# Patient Record
Sex: Male | Born: 1955
Health system: Southern US, Community
[De-identification: ages and names within clinical notes are randomized; demographics above are authoritative.]

## PROBLEM LIST (undated history)

## (undated) DIAGNOSIS — R739 Hyperglycemia, unspecified: Secondary | ICD-10-CM

## (undated) DIAGNOSIS — I25810 Atherosclerosis of coronary artery bypass graft(s) without angina pectoris: Secondary | ICD-10-CM

## (undated) DIAGNOSIS — M109 Gout, unspecified: Secondary | ICD-10-CM

## (undated) DIAGNOSIS — I2510 Atherosclerotic heart disease of native coronary artery without angina pectoris: Secondary | ICD-10-CM

## (undated) DIAGNOSIS — M542 Cervicalgia: Secondary | ICD-10-CM

## (undated) DIAGNOSIS — M549 Dorsalgia, unspecified: Secondary | ICD-10-CM

## (undated) DIAGNOSIS — I10 Essential (primary) hypertension: Secondary | ICD-10-CM

## (undated) DIAGNOSIS — T7840XA Allergy, unspecified, initial encounter: Secondary | ICD-10-CM

## (undated) DIAGNOSIS — T883XXA Malignant hyperthermia due to anesthesia, initial encounter: Secondary | ICD-10-CM

## (undated) DIAGNOSIS — R3915 Urgency of urination: Secondary | ICD-10-CM

## (undated) DIAGNOSIS — E785 Hyperlipidemia, unspecified: Secondary | ICD-10-CM

## (undated) DIAGNOSIS — I779 Disorder of arteries and arterioles, unspecified: Secondary | ICD-10-CM

## (undated) DIAGNOSIS — I209 Angina pectoris, unspecified: Secondary | ICD-10-CM

## (undated) DIAGNOSIS — R233 Spontaneous ecchymoses: Secondary | ICD-10-CM

## (undated) DIAGNOSIS — R238 Other skin changes: Secondary | ICD-10-CM

## (undated) DIAGNOSIS — I739 Peripheral vascular disease, unspecified: Secondary | ICD-10-CM

## (undated) DIAGNOSIS — K219 Gastro-esophageal reflux disease without esophagitis: Secondary | ICD-10-CM

## (undated) HISTORY — DX: Allergy, unspecified, initial encounter: T78.40XA

## (undated) HISTORY — DX: Disorder of arteries and arterioles, unspecified: I77.9

## (undated) HISTORY — DX: Peripheral vascular disease, unspecified: I73.9

## (undated) HISTORY — DX: Atherosclerotic heart disease of native coronary artery without angina pectoris: I25.10

## (undated) HISTORY — DX: Atherosclerosis of coronary artery bypass graft(s) without angina pectoris: I25.810

## (undated) HISTORY — PX: ESOPHAGOGASTRODUODENOSCOPY: SHX1529

## (undated) HISTORY — DX: Essential (primary) hypertension: I10

## (undated) HISTORY — DX: Gout, unspecified: M10.9

## (undated) HISTORY — DX: Hyperglycemia, unspecified: R73.9

## (undated) HISTORY — PX: COLONOSCOPY: SHX174

## (undated) HISTORY — PX: CYSTOSCOPY: SUR368

## (undated) HISTORY — DX: Hyperlipidemia, unspecified: E78.5

---

## 1978-01-02 HISTORY — PX: FRACTURE SURGERY: SHX138

## 1979-01-03 HISTORY — PX: ANKLE FRACTURE SURGERY: SHX122

## 1997-01-02 HISTORY — PX: CORONARY ARTERY BYPASS GRAFT: SHX141

## 1997-11-12 ENCOUNTER — Inpatient Hospital Stay (HOSPITAL_COMMUNITY): Admission: AD | Admit: 1997-11-12 | Discharge: 1997-11-16 | Payer: Self-pay | Admitting: Cardiology

## 1997-11-12 ENCOUNTER — Encounter: Payer: Self-pay | Admitting: Cardiology

## 1997-11-13 ENCOUNTER — Encounter: Payer: Self-pay | Admitting: Thoracic Surgery (Cardiothoracic Vascular Surgery)

## 1997-11-14 ENCOUNTER — Encounter: Payer: Self-pay | Admitting: Thoracic Surgery (Cardiothoracic Vascular Surgery)

## 1997-11-15 ENCOUNTER — Encounter: Payer: Self-pay | Admitting: Thoracic Surgery (Cardiothoracic Vascular Surgery)

## 1999-11-18 ENCOUNTER — Ambulatory Visit (HOSPITAL_COMMUNITY): Admission: RE | Admit: 1999-11-18 | Discharge: 1999-11-18 | Payer: Self-pay | Admitting: Gastroenterology

## 2001-06-28 ENCOUNTER — Ambulatory Visit (HOSPITAL_COMMUNITY): Admission: RE | Admit: 2001-06-28 | Discharge: 2001-06-28 | Payer: Self-pay | Admitting: Cardiology

## 2001-08-02 ENCOUNTER — Ambulatory Visit: Admission: RE | Admit: 2001-08-02 | Discharge: 2001-08-02 | Payer: Self-pay | Admitting: General Surgery

## 2001-08-02 ENCOUNTER — Encounter: Payer: Self-pay | Admitting: General Surgery

## 2001-08-08 ENCOUNTER — Ambulatory Visit (HOSPITAL_COMMUNITY): Admission: RE | Admit: 2001-08-08 | Discharge: 2001-08-08 | Payer: Self-pay | Admitting: General Surgery

## 2003-04-28 ENCOUNTER — Ambulatory Visit (HOSPITAL_COMMUNITY): Admission: RE | Admit: 2003-04-28 | Discharge: 2003-04-28 | Payer: Self-pay | Admitting: Family Medicine

## 2003-05-07 ENCOUNTER — Ambulatory Visit (HOSPITAL_COMMUNITY): Admission: RE | Admit: 2003-05-07 | Discharge: 2003-05-07 | Payer: Self-pay | Admitting: Gastroenterology

## 2003-08-03 HISTORY — PX: INGUINAL HERNIA REPAIR: SUR1180

## 2003-12-25 ENCOUNTER — Ambulatory Visit: Payer: Self-pay | Admitting: Cardiology

## 2004-02-26 ENCOUNTER — Ambulatory Visit: Payer: Self-pay | Admitting: Cardiology

## 2004-02-26 ENCOUNTER — Ambulatory Visit: Payer: Self-pay

## 2004-12-02 ENCOUNTER — Ambulatory Visit: Payer: Self-pay | Admitting: Cardiology

## 2005-05-05 ENCOUNTER — Ambulatory Visit (HOSPITAL_COMMUNITY): Admission: RE | Admit: 2005-05-05 | Discharge: 2005-05-05 | Payer: Self-pay | Admitting: Family Medicine

## 2005-12-01 ENCOUNTER — Ambulatory Visit: Payer: Self-pay | Admitting: Cardiology

## 2005-12-29 ENCOUNTER — Ambulatory Visit: Payer: Self-pay | Admitting: Cardiology

## 2006-01-12 ENCOUNTER — Ambulatory Visit: Payer: Self-pay

## 2006-01-12 ENCOUNTER — Encounter: Payer: Self-pay | Admitting: Internal Medicine

## 2006-12-07 ENCOUNTER — Ambulatory Visit: Payer: Self-pay | Admitting: Cardiology

## 2006-12-07 LAB — CONVERTED CEMR LAB
ALT: 37 units/L (ref 0–53)
Albumin: 4 g/dL (ref 3.5–5.2)
Alkaline Phosphatase: 55 units/L (ref 39–117)
BUN: 14 mg/dL (ref 6–23)
Bilirubin, Direct: 0.2 mg/dL (ref 0.0–0.3)
CO2: 29 meq/L (ref 19–32)
Chloride: 101 meq/L (ref 96–112)
Cholesterol: 130 mg/dL (ref 0–200)
Creatinine, Ser: 1.2 mg/dL (ref 0.4–1.5)
GFR calc Af Amer: 82 mL/min
Glucose, Bld: 108 mg/dL — ABNORMAL HIGH (ref 70–99)
HDL: 42 mg/dL (ref >39.0)
Potassium: 4.3 meq/L (ref 3.5–5.1)
Total Bilirubin: 0.7 mg/dL (ref 0.3–1.2)
Total Protein: 7 g/dL (ref 6.0–8.3)
VLDL: 13 mg/dL (ref 0–40)

## 2006-12-14 ENCOUNTER — Ambulatory Visit: Payer: Self-pay | Admitting: Cardiology

## 2007-07-18 ENCOUNTER — Ambulatory Visit (HOSPITAL_COMMUNITY): Admission: RE | Admit: 2007-07-18 | Discharge: 2007-07-18 | Payer: Self-pay | Admitting: Internal Medicine

## 2007-12-11 ENCOUNTER — Ambulatory Visit: Payer: Self-pay | Admitting: Cardiology

## 2007-12-20 ENCOUNTER — Ambulatory Visit: Payer: Self-pay

## 2007-12-20 ENCOUNTER — Ambulatory Visit: Payer: Self-pay | Admitting: Cardiology

## 2007-12-20 LAB — CONVERTED CEMR LAB
AST: 33 units/L (ref 0–37)
Alkaline Phosphatase: 57 units/L (ref 39–117)
Bilirubin, Direct: 0.1 mg/dL (ref 0.0–0.3)
Cholesterol: 126 mg/dL (ref 0–200)
GFR calc non Af Amer: 83 mL/min
Sodium: 137 meq/L (ref 135–145)
VLDL: 17 mg/dL (ref 0–40)

## 2008-09-23 ENCOUNTER — Encounter (INDEPENDENT_AMBULATORY_CARE_PROVIDER_SITE_OTHER): Payer: Self-pay | Admitting: *Deleted

## 2008-12-15 DIAGNOSIS — I10 Essential (primary) hypertension: Secondary | ICD-10-CM | POA: Insufficient documentation

## 2008-12-15 DIAGNOSIS — E785 Hyperlipidemia, unspecified: Secondary | ICD-10-CM | POA: Insufficient documentation

## 2008-12-18 ENCOUNTER — Telehealth: Payer: Self-pay | Admitting: Cardiology

## 2008-12-29 ENCOUNTER — Telehealth: Payer: Self-pay | Admitting: Cardiology

## 2009-01-22 ENCOUNTER — Ambulatory Visit: Payer: Self-pay | Admitting: Cardiology

## 2009-01-27 ENCOUNTER — Telehealth: Payer: Self-pay | Admitting: Cardiology

## 2009-09-30 ENCOUNTER — Ambulatory Visit (HOSPITAL_COMMUNITY): Admission: RE | Admit: 2009-09-30 | Discharge: 2009-09-30 | Payer: Self-pay | Admitting: Family Medicine

## 2009-12-22 ENCOUNTER — Telehealth (INDEPENDENT_AMBULATORY_CARE_PROVIDER_SITE_OTHER): Payer: Self-pay | Admitting: Radiology

## 2009-12-23 ENCOUNTER — Encounter: Payer: Self-pay | Admitting: Cardiology

## 2009-12-23 ENCOUNTER — Ambulatory Visit: Payer: Self-pay | Admitting: Cardiology

## 2009-12-23 ENCOUNTER — Ambulatory Visit: Payer: Self-pay

## 2009-12-23 ENCOUNTER — Encounter (HOSPITAL_COMMUNITY)
Admission: RE | Admit: 2009-12-23 | Discharge: 2010-02-01 | Payer: Self-pay | Source: Home / Self Care | Attending: Cardiology | Admitting: Cardiology

## 2009-12-28 ENCOUNTER — Encounter: Payer: Self-pay | Admitting: Cardiology

## 2009-12-30 LAB — CONVERTED CEMR LAB
AST: 35 units/L (ref 0–37)
Albumin: 4.1 g/dL (ref 3.5–5.2)
Alkaline Phosphatase: 59 units/L (ref 39–117)
Basophils Absolute: 0 10*3/uL (ref 0.0–0.1)
CO2: 28 meq/L (ref 19–32)
Cholesterol: 130 mg/dL (ref 0–200)
Creatinine, Ser: 1 mg/dL (ref 0.4–1.5)
Eosinophils Absolute: 0.2 10*3/uL (ref 0.0–0.7)
HCT: 42.7 % (ref 39.0–52.0)
HDL: 43.1 mg/dL (ref >39.00)
LDL Cholesterol: 60 mg/dL (ref 0–99)
MCV: 93 fL (ref 78.0–100.0)
Monocytes Absolute: 0.9 10*3/uL (ref 0.1–1.0)
Monocytes Relative: 11.4 % (ref 3.0–12.0)
Neutrophils Relative %: 64.3 % (ref 43.0–77.0)
RBC: 4.6 M/uL (ref 4.22–5.81)
RDW: 12.9 % (ref 11.5–14.6)
Sodium: 138 meq/L (ref 135–145)
VLDL: 27 mg/dL (ref 0.0–40.0)

## 2010-01-24 ENCOUNTER — Telehealth: Payer: Self-pay | Admitting: Cardiology

## 2010-01-27 ENCOUNTER — Telehealth: Payer: Self-pay | Admitting: Cardiology

## 2010-01-30 LAB — CONVERTED CEMR LAB
Alkaline Phosphatase: 56 units/L (ref 39–117)
BUN: 12 mg/dL (ref 6–23)
Basophils Absolute: 0 10*3/uL (ref 0.0–0.1)
Basophils Relative: 0.4 % (ref 0.0–3.0)
Bilirubin, Direct: 0 mg/dL (ref 0.0–0.3)
Calcium: 9.5 mg/dL (ref 8.4–10.5)
Eosinophils Absolute: 0.3 10*3/uL (ref 0.0–0.7)
Eosinophils Relative: 5.6 % — ABNORMAL HIGH (ref 0.0–5.0)
Glucose, Bld: 89 mg/dL (ref 70–99)
HCT: 45.2 % (ref 39.0–52.0)
HDL: 48.2 mg/dL (ref >39.00)
Hemoglobin: 14.6 g/dL (ref 13.0–17.0)
LDL Cholesterol: 52 mg/dL (ref 0–99)
MCHC: 32.4 g/dL (ref 30.0–36.0)
MCV: 95.9 fL (ref 78.0–100.0)
Monocytes Absolute: 0.7 10*3/uL (ref 0.1–1.0)
Monocytes Relative: 11.6 % (ref 3.0–12.0)
RBC: 4.72 M/uL (ref 4.22–5.81)
RDW: 12.7 % (ref 11.5–14.6)
Triglycerides: 106 mg/dL (ref 0.0–149.0)
VLDL: 21.2 mg/dL (ref 0.0–40.0)
WBC: 5.8 10*3/uL (ref 4.5–10.5)

## 2010-02-01 ENCOUNTER — Telehealth: Payer: Self-pay | Admitting: Cardiology

## 2010-02-01 NOTE — Assessment & Plan Note (Signed)
Summary: yearly/sl      Allergies Added:   Visit Type:  Follow-up Primary Provider:  Dr Lilia Pro  CC:  no complaints.  History of Present Illness: Michael [name] returns today for evaluation and management of his coronary disease, hypertension, and hyperlipidemia.  He is limited by his left heel he did not have surgically repaired. He said it is not much they can do for it.  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 He denies any chest discomfort or angina. He denies orthopnea, PND or peripheral edema. He struggled to get about 10 pounds the weight off.  Current Medications (verified): 1)  Aspirin Ec 325 Mg Tbec (Aspirin) .... Take One Tablet By Mouth Daily 2)  Diovan Hct 160-12.5 Mg Tabs (Valsartan-Hydrochlorothiazide) .Marland Kitchen.. 1 Tab Once Daily 3)  Vytorin 10-40 Mg Tabs (Ezetimibe-Simvastatin) .Marland Kitchen.. 1 Tab Once Daily 4)  Allopurinol 300 Mg Tabs (Allopurinol) .Marland Kitchen.. 1 Tab Once Daily  Allergies (verified): 1)  ! 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 ..quit 1998 Regular Exercise - no Alcohol Use - yes  Risk Factors: Exercise: no (12/15/2008)  Risk Factors: Smoking Status: quit (12/15/2008)  Review of Systems       negative other than history of present illness  Vital Signs:  Patient profile:   55 year old male Height:      70 inches Weight:      215 pounds BMI:     30.96 Pulse rate:   94 / minute BP  sitting:   148 / 84  (left arm) Cuff size:   large  Vitals Entered By: Burnett Kanaris, CNA (January 22, 2009 9:42 AM)  Physical Exam  General:  muscular, overweight Head:  normocephalic and atraumatic Eyes:  PERRLA/EOM intact; conjunctiva and lids normal. Mouth:  Teeth, gums and palate normal. Oral mucosa normal. Neck:  Neck supple, no JVD. No masses, thyromegaly or abnormal cervical nodes. Chest [name]:  no deformities or breast masses noted Lungs:  Clear bilaterally to auscultation and percussion. Heart:  Non-displaced PMI, chest non-tender; regular rate and rhythm, S1, S2 without murmurs, rubs or gallops. Carotid upstroke normal, no bruit. Normal abdominal aortic size, no bruits. Femorals normal pulses, no bruits. Pedals normal pulses. No edema, no varicosities. Abdomen:  Bowel sounds positive; abdomen soft and non-tender without masses, organomegaly, or hernias noted. No hepatosplenomegaly. Msk:  decreased ROM.   Pulses:  pulses normal in all 4 extremities Extremities:  No clubbing or cyanosis. Neurologic:  Alert and oriented x 3. Skin:  Intact without lesions or rashes. Psych:  Normal affect.   Impression & Recommendations:  Problem # 1:  CAD, ARTERY BYPASS GRAFT/ 1999 (ICD-414.04) Assessment Unchanged  I do not feel we need to repeat an objective assessment of his coronary disease this year. He is asymptomatic and his stress Myoview last year was unremarkable. We will repeat one next year. His updated medication list for this problem includes:    Aspirin Ec 325 Mg Tbec (Aspirin) .Marland Kitchen... Take one tablet by mouth daily  Orders: EKG w/ Interpretation (  93000) TLB-TSH (Thyroid Stimulating Hormone) (84443-TSH)  Problem # 2:  HYPERTENSION, CONTROLLED (ICD-401.1) His blood pressure is suboptimal today. He says it usually runs better than this at home. Have asked him to keep a blood pressure log and if his pressure is not less than 140/80 we could increase his Diovan to 320/25 of HCTZ.  He promises made let us know if is not controlled. His updated medication list for this problem includes:    Aspirin Ec 325 Mg Tbec (Aspirin) .Marland Kitchen... Take one tablet by mouth daily    Diovan Hct 160-12.5 Mg Tabs (Valsartan-hydrochlorothiazide) .Marland Kitchen... 1 tab once daily  His updated medication list for this problem includes:    Aspirin Ec 325 Mg Tbec (Aspirin) .Marland Kitchen... Take one tablet by mouth daily    Diovan Hct 160-12.5 Mg Tabs (Valsartan-hydrochlorothiazide) .Marland Kitchen... 1 tab once daily  Orders: TLB-BMP (Basic Metabolic Panel-BMET) (80048-METABOL)  Problem # 3:  HYPERLIPIDEMIA (ICD-272.4) Assessment: Unchanged  I will check a conference and metabolic panel today, TSH, as well as fasting lipids. I have encouraged him to lose weight. His updated medication list for this problem includes:    Vytorin 10-40 Mg Tabs (Ezetimibe-simvastatin) .Marland Kitchen... 1 tab once daily  His updated medication list for this problem includes:    Vytorin 10-40 Mg Tabs (Ezetimibe-simvastatin) .Marland Kitchen... 1 tab once daily  Orders: TLB-Lipid Panel (80061-LIPID)  Other Orders: TLB-CBC Platelet - w/Differential (85025-CBCD) TLB-Hepatic/Liver Function Pnl (80076-HEPATIC)  Patient Instructions: 1)  Your physician recommends that you schedule a follow-up appointment in: YEAR 2)  Your physician recommends that you return for lab work ZO:XWRUE BMET CBC LIPID LIVER TSH  3)  Your physician recommends that you continue on your current medications as directed. Please refer to the Current Medication list given to you today.

## 2010-02-01 NOTE — Progress Notes (Signed)
Summary: lab results   Phone Note Call from Patient Call back at [phone]   Caller: Patient Reason for Call: Lab or Test Results Initial call taken by: Judie Grieve,  January 27, 2009 1:00 PM  Follow-up for Phone Call        PT AWARE OF LAB RESULTS./CY Follow-up by: Scherrie Bateman, LPN,  January 27, 2009 1:02 PM

## 2010-02-02 ENCOUNTER — Telehealth: Payer: Self-pay | Admitting: Cardiology

## 2010-02-03 NOTE — Assessment & Plan Note (Signed)
Summary: F1Y/DFG   Visit Type:  1 yr f/u Primary Provider:  [facility]  CC:  pt had myoview today and denies any cardiac complaints today... and pt would like to talk to Dr. Daleen Squibb today about changing some of his meds to generic versions.  History of Present Illness: Mr. Ridener returns today for evaluation and management his coronary artery disease, history of current bypass grafting in 1999, hyperlipidemia, hypertension.  He's having no symptoms of angina or ischemia. He remains very active working. He is very concerned about the cost of his meds like to switch to generic statin and generic hypertensive therapy.  His cholesterol today is 1:30, triglycerides 135, HDL 43, total cholesterol ratio ratio 3 LDL 60. LFTs are normal electrolytes are normal and creatinine 1.0. Hemoglobin 14.9 platelets 229,000. Reviewed with patient and copy of the reports given to him.  A stress nuclear today shows an ejection fraction of 68% with no ischemia. Final report pending  Current Medications (verified): 1)  Aspirin Ec 325 Mg Tbec (Aspirin) .... Take One Tablet By Mouth Daily 2)  Diovan Hct 160-12.5 Mg Tabs (Valsartan-Hydrochlorothiazide) .Marland Kitchen.. 1 Tab Once Daily 3)  Vytorin 10-40 Mg Tabs (Ezetimibe-Simvastatin) .Marland Kitchen.. 1 Tab Once Daily 4)  Allopurinol 300 Mg Tabs (Allopurinol) .Marland Kitchen.. 1 Tab Once Daily  Allergies: 1)  ! 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 ..quit 1998 Regular Exercise - no Alcohol Use - yes  Risk Factors: Exercise: no (12/15/2008)  Risk Factors: Smoking Status: quit (12/15/2008)  Vital Signs:  Patient profile:   55 year old male Height:      70 inches Weight:      222.50 pounds BMI:     32.04 Pulse rate:   102 / minute Pulse rhythm:   irregular BP sitting:   122 / 70  (left arm) Cuff size:   large  Vitals Entered By: Danielle Rankin, CMA (December 23, 2009 2:01 PM)   Impression & Recommendations:  Problem # 1:  CAD, ARTERY BYPASS GRAFT/ 1999 (ICD-414.04) Assessment Unchanged  His updated medication list for this problem includes:    Aspirin Ec 325 Mg Tbec (Aspirin) .Marland Kitchen... Take one tablet by mouth daily  Problem # 2:  HYPERLIPIDEMIA (ICD-272.4) Assessment: Improved Will change to atorvastatin 80 mg of all blood work in 6-8 weeks. His updated medication list for this problem includes:    Vytorin 10-40 Mg Tabs (Ezetimibe-simvastatin) .Marland Kitchen... 1 tab once daily    Lipitor 80 Mg Tabs (Atorvastatin calcium) .Marland Kitchen... Take one tablet by mouth daily.  Problem # 3:  HYPERTENSION, CONTROLLED (ICD-401.1) Assessment: Improved we'll change to losartan HCTZ. Patient advised to check his blood pressure carefully once the changes made. His updated medication list for this problem includes:    Aspirin Ec 325 Mg Tbec (Aspirin) .Marland Kitchen... Take one tablet by mouth daily    Losartan Potassium-hctz 50-12.5 Mg Tabs (Losartan potassium-hctz) .Marland Kitchen... Take 1 tablet daily  Patient Instructions: 1)  Your physician recommends that you schedule a follow-up appointment in: 1 year with Dr.  Khaliel Morey 2)  Your physician has recommended you make the following change in your medication:  3)  Your physician recommends that you return for a FASTING lipid profile and liver in 6 weeks Prescriptions: LIPITOR 80 MG TABS (ATORVASTATIN CALCIUM) Take one tablet by mouth daily.  #30 x 3   Entered by:   Lisabeth Devoid RN   Authorized  by:   Gaylord Shih, MD, [facility]   Signed by:   Lisabeth Devoid RN on 12/23/2009   Method used:   Electronically to        [facility] (retail)       [address]       [place], [place]  [address]       Ph: [phone]       Fax: [phone]   RxID:   [id number] LOSARTAN POTASSIUM-HCTZ 50-12.5 MG TABS (LOSARTAN POTASSIUM-HCTZ) Take 1 tablet daily  #30 x 3   Entered by:   Lisabeth Devoid RN   Authorized by:   Gaylord Shih, MD, [facility]   Signed by:   Lisabeth Devoid RN on 12/23/2009   Method used:   Electronically to        [facility] (retail)       [address]       [place], [place]  [address]       Ph: [phone]       Fax: [phone]   RxID:   [id number] LIPITOR 80 MG TABS (ATORVASTATIN CALCIUM) Take one tablet by mouth daily.  #90 x 3   Entered by:   Lisabeth Devoid RN   Authorized by:   Gaylord Shih, MD, [facility]   Signed by:   Lisabeth Devoid RN on 12/23/2009   Method used:   Electronically to        [facility] (retail)             ,          Ph: [phone]       Fax: [phone]   RxID:   [id number] LOSARTAN POTASSIUM-HCTZ 50-12.5 MG TABS (LOSARTAN POTASSIUM-HCTZ) Take 1 tablet daily  #90 x 3   Entered by:   Lisabeth Devoid RN   Authorized by:   Gaylord Shih, MD, [facility]   Signed by:   Lisabeth Devoid RN on 12/23/2009   Method used:   Electronically to        [facility] (retail)             ,          Ph: [phone]       Fax: [phone]   RxID:   [id number]   Appended Document: F1Y/DFG    Clinical Lists Changes  Medications: Removed medication of VYTORIN 10-40 MG TABS (EZETIMIBE-SIMVASTATIN) 1 tab once daily Observations: Added new observation of PI CARDIO: Your physician recommends that you schedule a follow-up appointment in: 1 year with Dr. Daleen Squibb Your physician recommends that you return for a FASTING lipid profile:  02-18-10 at 8:45 am Your physician has recommended you make the  following change in your medication:  (12/23/2009 14:45)       Patient Instructions: 1)  Your physician recommends that you schedule a follow-up appointment in: 1 year with Dr. Daleen Squibb 2)  Your physician recommends that you return for a FASTING lipid profile:  02-18-10 at 8:45 am 3)  Your physician has recommended  you make the following change in your medication:

## 2010-02-03 NOTE — Progress Notes (Signed)
Summary: nuc pre-procedure  Phone Note Outgoing Call   Call placed by: Harlow Asa CNMT Call placed to: Patient Reason for Call: Confirm/change Appt Summary of Call: Left message with information on Myoview Information Sheet (see scanned document for details).      Nuclear Med Background Indications for Stress Test: Evaluation for Ischemia, Graft Patency   History: CABG, Echo, Myocardial Perfusion Study      Nuclear Pre-Procedure Cardiac Risk Factors: History of Smoking, Hypertension, Lipids Height (in): 70

## 2010-02-03 NOTE — Progress Notes (Addendum)
Summary: change of meds   Phone Note Call from Patient Call back at Work Phone [phone]   Caller: Patient Reason for Call: Talk to Nurse Summary of Call: pt calling back re meds. pt states debie called yesterday. Initial call taken by: Roe Coombs,  January 27, 2010 8:20 AM  Follow-up for Phone Call        01/27/10--1000am--pt calling back wanting to speak to dr Ashden Sonnenberg or debbie about changing his meds to some that are more cost effective--pt states he would like to go on simvastatin in place of generic lipitor --we will need a doseage, and would like to try lisinopril in place of losartin and we'll need dosage on this med too--once we get dr Vern Claude [place] please order through [facility]--on the lisinopril pt would like 30 day supply to see if he has any problems with drug Follow-up by: Ledon Snare, RN,  January 27, 2010 9:58 AM     Appended Document: change of meds didnt we address this already?

## 2010-02-03 NOTE — Assessment & Plan Note (Signed)
Summary: Cardiology Nuclear Testing  Nuclear Med Background Indications for Stress Test: Evaluation for Ischemia, Graft Patency   History: CABG, Echo, Myocardial Perfusion Study  History Comments: '99 CABG;'09 BJS:EGBTDV, EF=71%   Symptoms Comments: No cardiac complaints.   Nuclear Pre-Procedure Cardiac Risk Factors: History of Smoking, Hypertension, Lipids, Obesity Caffeine/Decaff Intake: None NPO After: 7:30 PM Lungs: Valera Castle, MD IV 0.9% NS with Angio Cath: 22g     IV Site: R Hand IV Started by: Bonnita Levan, RN Chest Size (in) 48     Height (in): 70 Weight (lb): 222 BMI: 31.97  Nuclear Med Study 1 or 2 day study:  1 day     Stress Test Type:  Stress Reading MD:  Olga Millers, MD     Referring MD:  Valera Castle, MD Resting Radionuclide:  Technetium 19m Tetrofosmin     Resting Radionuclide Dose:  11 mCi  Stress Radionuclide:  Technetium 68m Tetrofosmin     Stress Radionuclide Dose:  33 mCi   Stress Protocol Exercise Time (min):  6:00 min     Max HR:  141 bpm     Predicted Max HR:  165 bpm  Max Systolic BP: 219 mm Hg     Percent Max HR:  85.45 %     METS: 7.0 Rate Pressure Product:  76160    Stress Test Technologist:  Rea [employer], CMA-N     Nuclear Technologist:  Doyne Keel, CNMT  Rest Procedure  Myocardial perfusion imaging was performed at rest 45 minutes following the intravenous administration of Technetium 51m Tetrofosmin.  Stress Procedure  The patient exercised for six minutes.  The patient stopped due to fatigue and denied any chest pain.  There were nonspecific ST-T wave changes, occasional PVC's, rare PAC's and a brief episode of NSIVCD.  He had a hypertensive response to exercise, 219/83 to exercise.  Technetium 34m Tetrofosmin was injected at peak exercise and myocardial perfusion imaging was performed after a brief delay.  QPS Raw Data Images:  Acquisition technically good; normal left ventricular size. Stress Images:  There is decreased uptake in  the inferior wall Rest Images:  There is decreased uptake in the inferior wall. Subtraction (SDS):  No evidence of ischemia. Transient Ischemic Dilatation:  1.11  (Normal <1.22)  Lung/Heart Ratio:  .27  (Normal <0.45)  Quantitative Gated Spect Images QGS EDV:  87 ml QGS ESV:  27 ml QGS EF:  69 % QGS cine images:  Normal wall motion.   Overall Impression  Exercise Capacity: Fair exercise capacity. BP Response: Hypertensive blood pressure response. Clinical Symptoms: No chest pain ECG Impression: No significant ST segment change suggestive of ischemia. Overall Impression: Probably normal stress nuclear study with inferior thinning but no ischemia.  Appended Document: Cardiology Nuclear Testing stable, no change in treatment.  Appended Document: Cardiology Nuclear Testing Line busy x3. Mylo Red RN  Appended Document: Cardiology Nuclear Testing Left message on personal voicemail. Mylo Red RN

## 2010-02-03 NOTE — Progress Notes (Addendum)
Summary: medication questions   Phone Note Call from Patient Call back at Work Phone [phone]   Caller: Patient Summary of Call: medication questions Initial call taken by: Judie Grieve,  January 24, 2010 2:31 PM  Follow-up for Phone Call        Patient's medications are too expensive. He did some research and found that the generic Lipitor cost more than Vytorin. He would like to switch to Simvastatin b/c this is cheaper with [facility]. Also,  his Losartan Potassium-HCTZ is $170 for a 90 day suuply. Some of the cheaper BP medications were the nonselective beta blockers. He does not tolerate the Ace-Inhibitors. The Calcium Channel blockers were not as expensive either. Advised him that I would need to forward this to Dr. Daleen Squibb for review. Whitney Maeola Sarah RN  January 24, 2010 3:27 PM  Follow-up by: Whitney Maeola Sarah RN,  January 24, 2010 3:27 PM  Additional Follow-up for Phone Call Additional follow up Details #1::        No sub for losarten hctz, can switch to simvastatin 40mg  but will be less effective than Vytorin. If switches needs bloodwork in 6 weeks. I do not want him on beta blocker or calcium blocker. Additional Follow-up by: Gaylord Shih, MD, [facility],  January 25, 2010 9:33 AM     Appended Document: medication questions LMOM to call back if he would like to switch to simvastatin. Mylo Red RN

## 2010-02-04 ENCOUNTER — Telehealth: Payer: Self-pay | Admitting: Cardiology

## 2010-02-08 ENCOUNTER — Telehealth: Payer: Self-pay | Admitting: Cardiology

## 2010-02-09 NOTE — Progress Notes (Signed)
Summary: Pt request call   Phone Note Call from Patient Call back at Work Phone [phone]   Caller: Patient Reason for Call: Talk to Nurse Initial call taken by: Judie Grieve,  February 01, 2010 8:59 AM  Follow-up for Phone Call        Please call him Deb. Follow-up by: Gaylord Shih, MD, [facility],  February 01, 2010 2:03 PM

## 2010-02-09 NOTE — Progress Notes (Signed)
Summary: Pt request call  Medications Added SIMVASTATIN 40 MG TABS (SIMVASTATIN) 1 tablet every evening.       Phone Note Call from Patient Call back at Work Phone [phone]   Caller: Patient Reason for Call: Talk to Nurse Initial call taken by: Judie Grieve,  February 02, 2010 8:12 AM  Follow-up for Phone Call        Pt calls today b/c he would like to try Lisinopril and stop the Losartan.  He is aware he had a cough "years ago" with the Altace.  He will start Simvastatin 40mg  as previously recommended and repeat lab work on 04/01/10.  I will forward to Dr. Daleen Squibb for review. Mylo Red RN  Additional Follow-up for Phone Call Additional follow up Details #1::        agree. Additional Follow-up by: Gaylord Shih, MD, [facility],  February 02, 2010 9:54 AM    New/Updated Medications: SIMVASTATIN 40 MG TABS (SIMVASTATIN) 1 tablet every evening. Prescriptions: SIMVASTATIN 40 MG TABS (SIMVASTATIN) 1 tablet every evening.  #90 x 3   Entered by:   Lisabeth Devoid RN   Authorized by:   Gaylord Shih, MD, [facility]   Signed by:   Lisabeth Devoid RN on 02/02/2010   Method used:   Faxed to ...       [facility] (mail-order)             , [place]         Ph: [phone]       Fax: [phone]   RxID:   [id number]

## 2010-02-17 NOTE — Progress Notes (Signed)
Summary:  Lisinopril  Medications Added LISINOPRIL-HYDROCHLOROTHIAZIDE 20-12.5 MG TABS (LISINOPRIL-HYDROCHLOROTHIAZIDE) Take 1 tablet daily       Phone Note Call from Patient Call back at Home Phone [phone] Call back at Work Phone [phone]   Caller: Patient Reason for Call: Talk to Nurse Summary of Call: PT CALLING RE  Lisinopril. PT STATES DEBBIE WAS GOING TO CALL HIM BACK RE MEDS Initial call taken by: Roe Coombs,  February 08, 2010 3:17 PM    New/Updated Medications: LISINOPRIL-HYDROCHLOROTHIAZIDE 20-12.5 MG TABS (LISINOPRIL-HYDROCHLOROTHIAZIDE) Take 1 tablet daily Prescriptions: LISINOPRIL-HYDROCHLOROTHIAZIDE 20-12.5 MG TABS (LISINOPRIL-HYDROCHLOROTHIAZIDE) Take 1 tablet daily  #30 x 1   Entered by:   Lisabeth Devoid RN   Authorized by:   Gaylord Shih, MD, [facility]   Signed by:   Lisabeth Devoid RN on 02/08/2010   Method used:   Electronically to        [facility] (retail)       [address]       [place], [place]  [address]       Ph: [phone]       Fax: [phone]   RxID:   [id number]

## 2010-02-17 NOTE — Progress Notes (Signed)
Summary: question on meds   Phone Note Call from Patient Call back at Home Phone [phone]   Caller: Mom Reason for Call: Talk to Nurse Summary of Call: pt has question re meds Initial call taken by: Roe Coombs,  February 04, 2010 12:56 PM  Follow-up for Phone Call        Pt calls to see if he can switch from Losartan to Lisinopril b/c of cost.  I will forward to Dr. Daleen Squibb Follow-up by: Lisabeth Devoid RN,  February 04, 2010 1:42 PM  Additional Follow-up for Phone Call Additional follow up Details #1::        Yes but he may have dry cough as before. Additional Follow-up by: Gaylord Shih, MD, [facility],  February 08, 2010 9:00 AM

## 2010-02-18 ENCOUNTER — Other Ambulatory Visit: Payer: Self-pay

## 2010-04-01 ENCOUNTER — Other Ambulatory Visit (INDEPENDENT_AMBULATORY_CARE_PROVIDER_SITE_OTHER): Payer: 59 | Admitting: *Deleted

## 2010-04-01 DIAGNOSIS — E7800 Pure hypercholesterolemia, unspecified: Secondary | ICD-10-CM

## 2010-04-01 LAB — BASIC METABOLIC PANEL
CO2: 30 mEq/L (ref 19–32)
Calcium: 9.4 mg/dL (ref 8.4–10.5)
Chloride: 100 mEq/L (ref 96–112)
GFR: 77.78 mL/min (ref >60.00)
Potassium: 4.4 mEq/L (ref 3.5–5.1)
Sodium: 138 mEq/L (ref 135–145)

## 2010-04-01 LAB — HEPATIC FUNCTION PANEL
Albumin: 4.3 g/dL (ref 3.5–5.2)
Alkaline Phosphatase: 56 U/L (ref 39–117)
Total Bilirubin: 0.5 mg/dL (ref 0.3–1.2)
Total Protein: 7.3 g/dL (ref 6.0–8.3)

## 2010-04-01 LAB — LIPID PANEL
Cholesterol: 153 mg/dL (ref 0–200)
LDL Cholesterol: 83 mg/dL (ref 0–99)
Triglycerides: 136 mg/dL (ref 0.0–149.0)

## 2010-04-04 ENCOUNTER — Telehealth: Payer: Self-pay | Admitting: Cardiology

## 2010-04-04 NOTE — Telephone Encounter (Signed)
Pt calling re results from lab work

## 2010-04-04 NOTE — Telephone Encounter (Addendum)
Pt. Given results of lipid.liver and bmp.  I told him we would call him back after Dr. Daleen Squibb reviews with recommendations  he may have.  Pt states he has been trying lisinopril and has one week left of this.  He states he is tolerating it well with occasional tickle but it is not bothersome.  OK to continue lisinopril, follow bp

## 2010-04-11 ENCOUNTER — Telehealth: Payer: Self-pay | Admitting: Cardiology

## 2010-04-11 NOTE — Telephone Encounter (Signed)
Pt returning your call

## 2010-04-11 NOTE — Telephone Encounter (Signed)
LMOM for call back. 

## 2010-04-11 NOTE — Telephone Encounter (Signed)
Pt calling to get lab results and to see if he needs to continue medication. He will be out of the lisinopril today.

## 2010-04-11 NOTE — Telephone Encounter (Signed)
I left message for pt. To call back.

## 2010-04-12 ENCOUNTER — Telehealth: Payer: Self-pay | Admitting: *Deleted

## 2010-04-12 DIAGNOSIS — I10 Essential (primary) hypertension: Secondary | ICD-10-CM

## 2010-04-12 MED ORDER — LISINOPRIL-HYDROCHLOROTHIAZIDE 20-12.5 MG PO TABS: 1.0000 | ORAL_TABLET | Freq: Every day | ORAL | Status: DC

## 2010-04-12 NOTE — Telephone Encounter (Signed)
lmovm Debbie Dariel Pellecchia RN  

## 2010-04-12 NOTE — Telephone Encounter (Signed)
Pt is able to tolerate Lisinopril-HCTZ 20-12.5mg  qd .  Prescription called in to [facility]. Mylo Red RN

## 2010-04-12 NOTE — Telephone Encounter (Signed)
Pt returned call. Lisinopril-hctz 20-12.5mg  ordered

## 2010-05-16 ENCOUNTER — Telehealth: Payer: Self-pay | Admitting: Cardiology

## 2010-05-16 NOTE — Telephone Encounter (Signed)
Pt needs to discuss changing his prescription for Lisinopril.

## 2010-05-16 NOTE — Telephone Encounter (Signed)
Pt calls today b/c he is unable to tolerate the Lisinopril. [facility] had suggested a beta blocker.  I told pt I would send this to Dr. Daleen Squibb for review. Mylo Red RN

## 2010-05-16 NOTE — Telephone Encounter (Signed)
lmovm will call back 05/17/10. Mylo Red RN

## 2010-05-17 MED ORDER — BENAZEPRIL-HYDROCHLOROTHIAZIDE 20-12.5 MG PO TABS: 1.0000 | ORAL_TABLET | Freq: Every day | ORAL | Status: DC

## 2010-05-17 NOTE — Telephone Encounter (Signed)
lmovm Debbie Kristyl Athens RN  

## 2010-05-17 NOTE — Assessment & Plan Note (Signed)
[facility]                            CARDIOLOGY OFFICE NOTE   NAME:Michael [name], Michael [name]                        MRN:          [id number]  DATE:12/11/2007                            DOB:          1955/06/05    Mr. Michael [name] comes in today for followup.  He is having no symptoms of  angina or ischemia.  He is having a major problem with his left heel.  He had some sort of stress fracture there.  He has seen a podiatrist but  saying he might go in and see Dr. Aldean Baker.  I have strongly  recommended he do so.  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   He has not had blood work in a year and is due.   He is currently taking:  1. Aspirin 325 mg a day.  2. Diovan/HCTZ 160/12.5 daily.  3. Vytorin 10/40 daily.  4. Allopurinol, unknown dose.   He has developed gout since I last saw him.   PHYSICAL EXAMINATION:  VITAL SIGNS:  His blood pressure is high today at  164/82, his pulse is 91 and regular, and his weight is 224.  HEENT:  Normal.  NECK:  Carotids upstrokes are equal bilaterally without bruits.  No JVD.  Thyroid is not enlarged.  Trachea is midline.  LUNGS:  Clear to auscultation and percussion.  HEART:  Poorly appreciated PMI.  Soft S1 and S2.  Sternotomy is stable.  ABDOMEN:  Soft.  Good bowel sounds.  No midline bruit.  No pulsatile  mass.  No hepatomegaly.  EXTREMITIES:  No cyanosis, clubbing, or edema.  Pulses are intact.  NEUROLOGIC:  Intact.  MUSCULOSKELETAL:  He has a knot that is not really that tender of his  left calcaneus, really in the area of the insertion of Achilles tendon.   I have had a long talk today about Michael [name] and his orthopedic issues.  I  have strongly recommended he see Dr. Aldean Baker, whom his mother is  seeing as well.  In addition, he is due a stress test, which we will do  an adenosine since he cannot really walk that well at present.  We will  also check  fasting lipids and a comprehensive metabolic panel the day he  comes.   I checked an EKG prior to his departure, and it shows normal sinus  rhythm with ST-segment changes in I, aVL, V, and VI, which are stable.   I will see him back in a year otherwise.     Thomas C. Daleen Squibb, MD, [facility]  Electronically Signed    TCW/MedQ  DD: 12/11/2007  DT: 12/12/2007  Job #: [id number]   cc:   Nadara Mustard, MD

## 2010-05-17 NOTE — Telephone Encounter (Signed)
Pt will start benazepril-hctz  20/12.5mg  per Dr. Daleen Squibb recommendation.   Mylo Red RN

## 2010-05-17 NOTE — Assessment & Plan Note (Signed)
[facility]                            CARDIOLOGY OFFICE NOTE   NAME:[name], Michael HICKAM                        MRN:          [id number]  DATE:12/14/2006                            DOB:          [birth year]    [name] returns for further management of the following issues.  1. Coronary artery disease status post bypass surgery in 1999.  Last      stress Myoview was January of 2008.  Good exercise tolerance.  EF      66% with no significant ischemia.  2. Hyperlipidemia.  Lipids drawn December 07, 2006.  Normal LFTs.      Lipids at goal except for a slight increase in his LDL at 75.  He      is on Vytorin 10/40.  3. Hypertension, under good control.   When I talk to him, he is having trouble with his weight.  He does not  do much physical activity.  In addition, he likes white starches.   Blood pressure 139/80, pulse 89 and regular, weight 223.  HEENT:  Normocephalic, atraumatic.  PERRLA.  Extraocular movements  intact.  Sclerae clear.  Facial symmetry normal.  Carotid upstrokes equal bilaterally.  No JVD.  Thyroid is not enlarged,  trachea is midline.  LUNGS:  Clear.  HEART:  Poorly appreciated PMI.  Normal S1, S2.  ABDOMEN:  Soft with good bowel sounds.  EXTREMITIES:  No cyanosis, clubbing, or edema.  Pulses are intact.   EKG is stable.   [name] is doing well.  I made no change in his program.  We will see  him back in a year.  At that time, get a stress Myoview and blood work.     Thomas C. Daleen Squibb, MD, [facility]  Electronically Signed    TCW/MedQ  DD: 12/14/2006  DT: 12/14/2006  Job #: [id number]

## 2010-05-17 NOTE — Telephone Encounter (Signed)
Pt rtn call-pls call [phone] or [phone]

## 2010-05-20 NOTE — Op Note (Signed)
Michael [name], Michael [name]                          ACCOUNT NO.:  [id number]   MEDICAL RECORD NO.:  [id number]                   PATIENT TYPE:  AMB   LOCATION:  DAY                                  FACILITY:  [facility]   PHYSICIAN:  Angelia Mould. Derrell Lolling, M.D.             DATE OF BIRTH:  [birth year]   DATE OF PROCEDURE:  DATE OF DISCHARGE:  08/08/2001                                 OPERATIVE REPORT   PREOPERATIVE DIAGNOSIS:  Bilateral inguinal hernia.   POSTOPERATIVE DIAGNOSIS:  Bilateral inguinal hernias.   OPERATION PERFORMED:  Laparoscopic preperitoneal repair of bilateral  inguinal hernias with mesh.   SURGEON:  Angelia Mould. Derrell Lolling, M.D.   OPERATIVE INDICATIONS:  This is a 55 year old white man who has had a bulge  in his right groin for at least 10 years.  This has been getting larger and  is now painful.  He also feels a small bulge in his left groin.  On  examination he has a large right inguinal hernia and a small left inguinal  hernia; both are reducible.  No scrotal mass.  He is brought to the  operating room electively for repair of his hernias.   OPERATIVE TECHNIQUE:  Following the induction of general endotracheal  anesthesia, a Foley catheter was inserted and the bladder was emptied of  urine.  Abdomen and groin were prepped and draped in a sterile fashion.  Then 0.5% Marcaine with epinephrine was used as a local infiltration  anesthetic.  A curved, transverse incision was made at the lower end of the  umbilicus.  The fascia was incised transversely, exposing the right rectus  muscle.  The right rectus muscle was retracted laterally, and we bluntly  dissected in the space behind the right rectus muscle.  Balloon dissector  was inserted into the right rectus sheath.  Video camera was inserted.  The  balloon dissector was inflated manually under direct vision, and the balloon  expanded fairly well on both sides.  We held the balloon in place for about  five minutes to reduce  bleeding.  The balloon was removed.  The insufflating  trocar was inserted into the right rectus sheath, and the balloon inflated  and secured; connected to the insufflator at 13 mmHg.  The video camera was  reinserted.  We had good visualization.  We could see both rectus muscles  anteriorly, inferior epigastric vessels anteriorly, symphysis pubis  inferiorly, and preperitoneal fat posteriorly.  We inserted 5 mm trocar in  the right lower quadrant and a 5 mm trocar in the left lower quadrant under  direct vision.  We dissected all of the peritoneum back laterally and away.   We found the bilateral direct inguinal hernias.  The one on the right side  was quite large, and the one on the left was smaller.  We dissected out the  cord structures, testicular vessels and vas deferens bilaterally.  The  edge  of the peritoneum was seen in both cases, and dissected all the way back  above the level of the anterosuperior iliac spine.   We dissected the direct inguinal hernias on both sides, dissecting the  pseudosac away from incarcerated fat, which was reduced back into the  preperitoneal space.  After we did this on both sides, we had good  anatomical definition of the symphysis pubis, Cooper's ligaments, iliac  vessels, internal ring, and the ileopubic tract both medial and lateral to  the internal ring.   The hernias were repaired bilaterally with the convex Bard mesh, large size.  On each side the mesh was inserted and positioned, and secured with 5 mm  tacking device.  The mesh was secured to the superior rim of the symphysis  pubis, and along the superior rim of Cooper's ligament with about four or  five of the tacks.  The mesh was secured up along the midline and across the  posterior belly of the rectus muscle; then laterally the 5 mm tacker was  fired above the ileopubic tract.  Everywhere, and especially laterally, we  were careful to be palpate the tacker through the skin so that we  could be  sure that we were high enough.  In the midline we overlapped the mesh about  1 cm.  This provided a very nice coverage both direct and indirect spaces.   Inspection was made.  We had pulled all the preperitoneal fat back and away  from the mesh.  There was no bleeding.  The mesh was held in place laterally  and pneumoperitoneum released.  The trocars were removed.  The fascia at the  umbilicus was closed with 0 Vicryl sutures and the skin closed with  subcuticular sutures of 4-0 Vicryl and Steri-Strips.  Clean bandages were  placed and the patient taken to the recovery room in stable condition.   ESTIMATED BLOOD LOSS:  Approximately 20 cc.   COMPLICATIONS:  None.   COUNTS:  Sponge, needle and instrument counts were correct.                                                Angelia Mould. Derrell Lolling, M.D.    HMI/MEDQ  D:  08/08/2001  T:  08/12/2001  Job:  [id number]   cc:   Michael [name], M.D.

## 2010-05-20 NOTE — Group Therapy Note (Signed)
NAME:  Michael [name], Michael [name] NO.:  [id number]   MEDICAL RECORD NO.:  [id number]          PATIENT TYPE:  OUT   LOCATION:  RDC                           FACILITY:  [facility]   PHYSICIAN:  Angus G. Renard Matter, MD   DATE OF BIRTH:  Jun 24, 1955   DATE OF PROCEDURE:  DATE OF DISCHARGE:  07/18/2007                                 PROGRESS NOTE   This patient does have a history of atrial fibrillation and  hypertension, was admitted with history of having become unresponsive.  He does have advanced Alzheimer disease and chronic atrial fibrillation  along with mitral regurgitation.   OBJECTIVE:  VITAL SIGNS:  Blood pressure 159/86, respiration 18, pulse  91, and temp 98.3.  GENERAL:  The patient's chemistries are within  normal limits.  The patient appears to be more alert.  LUNGS:  Clear to  P and A.  HEART:  Regular rhythm.  ABDOMEN:  No palpable organs or  masses.   ASSESSMENT:  The patient was admitted with altered mental status.  He  does have advanced dementia.  Plan to continue current regimen with help  of physical therapy that we have to get him ambulatory.  Plan is to get  him back home as soon as possible.  Incidentally, no patient has growth  on the right eye lid, which could be a malignancy.  The family response  have been aggressive with the treatment of this problem and does not  desire an additional evaluation at present.      Angus G. Renard Matter, MD  Electronically Signed     AGM/MEDQ  D:  07/20/2007  T:  07/20/2007  Job:  [id number]

## 2010-05-20 NOTE — Assessment & Plan Note (Signed)
[facility]                            CARDIOLOGY OFFICE NOTE   NAME:[name], [name]                        MRN:          [id number]  DATE:12/29/2005                            DOB:          October 17, 1955    Mr. Friedt returns today for management of the following issues:  1. Coronary artery disease status post coronary artery bypass grafting      in 1999.  Last stress Myoview was February 2006.  EF 72% with no      ischemia or infarction.  2. Mixed hyperlipidemia.  His lipids were checked in November 2007 and      were at goal.  3. Hypertension, under good control on Diovan HCT.   He has no cardiovascular complaints today.  He has gained 5 pounds.  He  has been having a lot of problems with his feet, particularly his right  arch.  He has been to a podiatrist but it actually made it worse.  He is  interested in an orthopedic physician.  I have conferred with Dr. Nicholes [name] and we both feel that Dr. Lestine [name] at [facility]  may be able to help him.   His current medications are:  1. Aspirin 325 mg a day.  2. Diovan HCT 160/12.5 daily.  3. Vytorin 10/40 daily.   EXAMINATION:  GENERAL:  He is in no acute distress.  VITAL SIGNS:  His blood pressure is 139/79, his pulse is 95 and regular.  His weight is 222.  HEENT:  He is normocephalic, atraumatic, PERRLA, extraocular movements  intact, sclerae injected.  Facial symmetry is normal.  Dentition is  normal.  Carotid upstrokes are equal bilaterally without bruits.  There  is no JVD.  Thyroid is not enlarged.  Trachea is midline.  LUNGS:  Clear to auscultation.  HEART:  Reveals a poorly appreciated PMI.  His sternotomy site is  stable.  Normal S1, S2.  He has got a systolic murmur that is louder at  the apex with inspiration, softer with expiration.  It disappears with  lying him down.  ABDOMEN:  Soft with good bowel sounds.  There is no midline bruit, there  is no hepatosplenomegaly.  Bowel  sounds are present.  EXTREMITIES:  Reveal no cyanosis, clubbing or edema.  He does have some  dependent rubor.  His toes are cool but he has bounding dorsalis pedis  and posterior tibial pulses.  There is no sign of DVT and no venous  stasis.  NEUROLOGIC:  Intact.  SKIN:  Intact and normal.   EKG shows sinus rhythm, left atrial enlargement, ST segment changes in  the lateral leads I and aVL, V5, and V6 which are old.   ASSESSMENT:  1. Coronary artery disease, currently stable.  He is due a rest/stress      Myoview.  2. Hypertension, under reasonable control.  3. Hyperlipidemia, at goal on Vytorin.  4. Systolic murmur that increases with inspiration, disappears with      lying down, rule out mitral valve pathology.  5. Orthopedic issues in his right foot.  RECOMMENDATIONS:  1. Rest/exercise stress Myoview February 2008.  2. A 2-D echocardiogram at the same time.  3. Refer to Dr. Lestine [name] at [facility].  4. See me back in a year.     Michael C. Daleen Squibb, MD, [facility]  Electronically Signed    TCW/MedQ  DD: 12/29/2005  DT: 12/29/2005  Job #: [id number]   cc:   Michael [name], M.D.

## 2010-05-20 NOTE — Op Note (Signed)
NAME:  Michael [name], Michael [name]                           ACCOUNT NO.:  [id number]   MEDICAL RECORD NO.:  [id number]                   PATIENT TYPE:  AMB   LOCATION:  ENDO                                 FACILITY:  [facility]   PHYSICIAN:  Danise Edge, M.D.                DATE OF BIRTH:  08-04-1955   DATE OF PROCEDURE:  05/07/2003  DATE OF DISCHARGE:                                 OPERATIVE REPORT   REFERRING PHYSICIAN:  Dr. Assunta Found, [facility].   PROCEDURAL INDICATIONS:  [name] is a 55 year old male born on  [birth year].  [name] is scheduled to undergo a screening  colonoscopy with polypectomy to prevent colon cancer.   ENDOSCOPIST:  Danise Edge, M.D.   PREMEDICATION:  Versed 10 mg, Demerol 70 mg.   PROCEDURE:  After obtaining informed consent, [name] was placed in the  left lateral decubitus position.  I administered intravenous Demerol and  intravenous Versed to achieve conscious sedation for the procedure.  Patient's blood pressure, oxygen saturation, and cardiac rhythm were  monitored throughout the procedure and documented in the medical records.   Anal inspection and digital rectal exam was normal.  The prostate was  nonnodular.  The Olympus adjustable pediatric colonoscope was introduced  into the rectum and advanced to the cecum.  Colonic preparation for the exam  today was excellent.   RECTUM:  Normal.   SIGMOID COLON/DESCENDING COLON:  Normal.   SPLENIC FLEXURE:  Normal.   TRANSVERSE COLON:  Normal.   HEPATIC FLEXURE:  Normal.   ASCENDING COLON:  Normal.   CECUM AND ILEOCECAL VALVE:  Normal.   ASSESSMENT:  Normal screening proctocolonoscopy of the cecum.  No endoscopic  evidence for the presence of colorectal neoplasia.                                               Danise Edge, M.D.    MJ/MEDQ  D:  05/07/2003  T:  05/07/2003  Job:  [id number]   cc:   Corrie Mckusick, M.D.  [address]   [place]  [place] [address]  Fax: [phone]

## 2010-05-31 ENCOUNTER — Telehealth: Payer: Self-pay | Admitting: Cardiology

## 2010-05-31 NOTE — Telephone Encounter (Signed)
Sounds good to me

## 2010-05-31 NOTE — Telephone Encounter (Signed)
I spoke with pt this afternoon and he has developed a cough with the benazepril-hctz.  He saw his pcp last week and bp was 170/90.  Pt is able to tolerate Losartan-HCTZ .  His pcp recommended increasing the losartan hctz (100/12.5mg ).  Pt wanted to check with Dr. Daleen Squibb about this as he manages his hypertension.  I will forward to Dr. Daleen Squibb for review and return call to pt. Mylo Red RN

## 2010-05-31 NOTE — Telephone Encounter (Signed)
Pt called and wanted to speak to you regarding prescription changes. Please call his work number before 5:00 if possible.

## 2010-06-01 MED ORDER — LOSARTAN POTASSIUM-HCTZ 100-12.5 MG PO TABS: 1.0000 | ORAL_TABLET | Freq: Every day | ORAL | Status: DC

## 2010-06-01 NOTE — Telephone Encounter (Signed)
Addended by: Mylo Red F on: 06/01/2010 09:32 AM   Modules accepted: Orders

## 2010-11-25 ENCOUNTER — Emergency Department (INDEPENDENT_AMBULATORY_CARE_PROVIDER_SITE_OTHER)
Admission: EM | Admit: 2010-11-25 | Discharge: 2010-11-25 | Disposition: A | Discharge status: Home / Self Care | Payer: 59 | Source: Home / Self Care | Attending: Family Medicine | Admitting: Family Medicine

## 2010-11-25 ENCOUNTER — Encounter: Payer: Self-pay | Admitting: *Deleted

## 2010-11-25 DIAGNOSIS — R1012 Left upper quadrant pain: Secondary | ICD-10-CM

## 2010-11-25 LAB — POCT URINALYSIS DIP (DEVICE)
Hgb urine dipstick: NEGATIVE
Leukocytes, UA: NEGATIVE
Protein, ur: NEGATIVE mg/dL
Specific Gravity, Urine: 1.02 (ref 1.005–1.030)
Urobilinogen, UA: 0.2 mg/dL (ref 0.0–1.0)

## 2010-11-25 NOTE — ED Notes (Signed)
Pt  Reports  He  Had  Episode  Of  abd  Pain     This  Am       Luq        Pain           Pain  Is  Better  Now  Than it  Was         Earlier  No  Nausea  No      Vomiting       No  Diarrhea

## 2010-11-25 NOTE — ED Provider Notes (Signed)
History     CSN: [id number] Arrival date & time: 11/25/2010 11:06 AM   First MD Initiated Contact with Patient 11/25/10 1036      Chief Complaint  Patient presents with  . Abdominal Pain    (Consider location/radiation/quality/duration/timing/severity/associated sxs/prior treatment) Patient is a 55 y.o. male presenting with abdominal pain. The history is provided by the patient.  Abdominal Pain The primary symptoms of the illness include abdominal pain. The primary symptoms of the illness do not include fever, nausea, vomiting, diarrhea or dysuria. The current episode started 3 to 5 hours ago. The onset of the illness was sudden. The problem has been resolved.  Associated with: nl bm this am. The patient has not had a change in bowel habit. Symptoms associated with the illness do not include diaphoresis, heartburn or constipation. Significant associated medical issues do not include PUD, GERD or diverticulitis. Associated medical issues comments: h/o neg colonoscopy 5 yr ago.Marland Kitchen    No past medical history on file.  No past surgical history on file.  No family history on file.  History  Substance Use Topics  . Smoking status: Not on file  . Smokeless tobacco: Not on file  . Alcohol Use: Not on file      Review of Systems  Constitutional: Negative for fever and diaphoresis.  Gastrointestinal: Positive for abdominal pain. Negative for heartburn, nausea, vomiting, diarrhea and constipation.  Genitourinary: Negative for dysuria.    Allergies  Ramipril  Home Medications   Current Outpatient Rx  Name Route Sig Dispense Refill  . LOSARTAN POTASSIUM-HCTZ 100-12.5 MG PO TABS Oral Take 1 tablet by mouth daily. 90 tablet 3    BP 165/92  Pulse 96  Temp(Src) 97 F (36.1 C) (Oral)  Resp 20  SpO2 100%  Physical Exam  Nursing note and vitals reviewed. Constitutional: He is oriented to person, place, and time. He appears well-developed and well-nourished.  HENT:  Head:  Normocephalic.  Pulmonary/Chest: Effort normal and breath sounds normal.  Abdominal: Soft. Bowel sounds are normal. He exhibits no distension and no mass. There is no tenderness. There is no rebound and no guarding.  Neurological: He is alert and oriented to person, place, and time.  Skin: Skin is warm and dry.    ED Course  Procedures (including critical care time)   Labs Reviewed  POCT URINALYSIS DIPSTICK   No results found.   No diagnosis found.    MDM  Lab neg.        Barkley Bruns, MD 11/25/10 [phone]

## 2010-12-14 ENCOUNTER — Encounter: Payer: Self-pay | Admitting: *Deleted

## 2010-12-15 ENCOUNTER — Encounter: Payer: Self-pay | Admitting: Cardiology

## 2010-12-15 ENCOUNTER — Ambulatory Visit (INDEPENDENT_AMBULATORY_CARE_PROVIDER_SITE_OTHER): Payer: 59 | Admitting: Cardiology

## 2010-12-15 DIAGNOSIS — E785 Hyperlipidemia, unspecified: Secondary | ICD-10-CM

## 2010-12-15 DIAGNOSIS — Z951 Presence of aortocoronary bypass graft: Secondary | ICD-10-CM | POA: Insufficient documentation

## 2010-12-15 DIAGNOSIS — I2510 Atherosclerotic heart disease of native coronary artery without angina pectoris: Secondary | ICD-10-CM | POA: Insufficient documentation

## 2010-12-15 DIAGNOSIS — I10 Essential (primary) hypertension: Secondary | ICD-10-CM

## 2010-12-15 MED ORDER — SIMVASTATIN 40 MG PO TABS: 40.0000 mg | ORAL_TABLET | Freq: Every day | ORAL | Status: DC

## 2010-12-15 MED ORDER — ALLOPURINOL 300 MG PO TABS: 300.0000 mg | ORAL_TABLET | Freq: Every day | ORAL | Status: DC

## 2010-12-15 MED ORDER — CARVEDILOL 6.25 MG PO TABS: 6.2500 mg | ORAL_TABLET | Freq: Two times a day (BID) | ORAL | Status: DC

## 2010-12-15 NOTE — Assessment & Plan Note (Signed)
Not well controlled. No change as above.

## 2010-12-15 NOTE — Patient Instructions (Signed)
Your physician has recommended you make the following change in your medication:  start Coreg ( Carvedilol)  Your physician wants you to follow-up in: 1 year with Dr. Daleen Squibb. You will receive a reminder letter in the mail two months in advance. If you don't receive a letter, please call our office to schedule the follow-up appointment.  Your physician encouraged you to lose weight for better health. A weight loss of 1 pound per week until you reach your ideal weight.  Walk 30 minutes a day for a total of 3 hours per week.   2 Gram Low Sodium Diet A 2 gram sodium diet restricts the amount of sodium in the diet to no more than 2 g or 2000 mg daily. Limiting the amount of sodium is often used to help lower blood pressure. It is important if you have heart, liver, or kidney problems. Many foods contain sodium for flavor and sometimes as a preservative. When the amount of sodium in a diet needs to be low, it is important to know what to look for when choosing foods and drinks. The following includes some information and guidelines to help make it easier for you to adapt to a low sodium diet. QUICK TIPS  Do not add salt to food.   Avoid convenience items and fast food.   Choose unsalted snack foods.   Buy lower sodium products, often labeled as "lower sodium" or "no salt added."   Check food labels to learn how much sodium is in 1 serving.   When eating at a restaurant, ask that your food be prepared with less salt or none, if possible.  READING FOOD LABELS FOR SODIUM INFORMATION The nutrition facts label is a good place to find how much sodium is in foods. Look for products with no more than 500 to 600 mg of sodium per meal and no more than 150 mg per serving. Remember that 2 g = 2000 mg. The food label may also list foods as:  Sodium-free: Less than 5 mg in a serving.   Very low sodium: 35 mg or less in a serving.   Low-sodium: 140 mg or less in a serving.   Light in sodium: 50% less  sodium in a serving. For example, if a food that usually has 300 mg of sodium is changed to become light in sodium, it will have 150 mg of sodium.   Reduced sodium: 25% less sodium in a serving. For example, if a food that usually has 400 mg of sodium is changed to reduced sodium, it will have 300 mg of sodium.  CHOOSING FOODS Grains  Avoid: Salted crackers and snack items. Some cereals, including instant hot cereals. Bread stuffing and biscuit mixes. Seasoned rice or pasta mixes.   Choose: Unsalted snack items. Low-sodium cereals, oats, puffed wheat and rice, shredded wheat. English muffins and bread. Pasta.  Meats  Avoid: Salted, canned, smoked, spiced, pickled meats, including fish and poultry. Bacon, ham, sausage, cold cuts, hot dogs, anchovies.   Choose: Low-sodium canned tuna and salmon. Fresh or frozen meat, poultry, and fish.  Dairy  Avoid: Processed cheese and spreads. Cottage cheese. Buttermilk and condensed milk. Regular cheese.   Choose: Milk. Low-sodium cottage cheese. Yogurt. Sour cream. Low-sodium cheese.  Fruits and Vegetables  Avoid: Regular canned vegetables. Regular canned tomato sauce and paste. Frozen vegetables in sauces. Olives. Rosita Fire. Relishes. Sauerkraut.   Choose: Low-sodium canned vegetables. Low-sodium tomato sauce and paste. Frozen or fresh vegetables. Fresh and frozen fruit.  Condiments  Avoid: Canned and packaged gravies. Worcestershire sauce. Tartar sauce. Barbecue sauce. Soy sauce. Steak sauce. Ketchup. Onion, garlic, and table salt. Meat flavorings and tenderizers.   Choose: Fresh and dried herbs and spices. Low-sodium varieties of mustard and ketchup. Lemon juice. Tabasco sauce. Horseradish.  SAMPLE 2 GRAM SODIUM MEAL PLAN Breakfast / Sodium (mg)  1 cup low-fat milk / 143 mg   2 slices whole-wheat toast / 270 mg   1 tbs heart-healthy margarine / 153 mg   1 hard-boiled egg / 139 mg   1 small orange / 0 mg  Lunch / Sodium (mg)  1 cup raw  carrots / 76 mg    cup hummus / 298 mg   1 cup low-fat milk / 143 mg    cup red grapes / 2 mg   1 whole-wheat pita bread / 356 mg  Dinner / Sodium (mg)  1 cup whole-wheat pasta / 2 mg   1 cup low-sodium tomato sauce / 73 mg   3 oz lean ground beef / 57 mg   1 small side salad (1 cup raw spinach leaves,  cup cucumber,  cup yellow bell pepper) with 1 tsp olive oil and 1 tsp red wine vinegar / 25 mg  Snack / Sodium (mg)  1 container low-fat vanilla yogurt / 107 mg   3 graham cracker squares / 127 mg  Nutrient Analysis  Calories: 2033   Protein: 77 g   Carbohydrate: 282 g   Fat: 72 g   Sodium: 1971 mg  Document Released: 12/19/2004 Document Revised: 08/31/2010 Document Reviewed: 03/22/2009 [facility] Patient Information 2012 [place], [place].

## 2010-12-15 NOTE — Assessment & Plan Note (Signed)
Stable. He needs better blood pressure control. We'll add carvedilol 6.25 b.i.d. I've asked him to lose weight and watch his salt. Blood pressure goals given.

## 2010-12-15 NOTE — Progress Notes (Signed)
HPI Michael [name] returns today for a history of coronary disease, prior bypass grafting, hypertension and hyperlipidemia.  He denies any angina or dyspnea on exertion. His weight is increased if he is not careful with salt. We have worked extensively on generic medications. Unfortunately, it appears that this is not controlling his blood pressure.  He denies orthopnea, PND or edema.  Past Medical History  Diagnosis Date  . Coronary atherosclerosis of artery bypass graft   . Essential hypertension, benign   . Other and unspecified hyperlipidemia     Current Outpatient Prescriptions  Medication Sig Dispense Refill  . allopurinol (ZYLOPRIM) 300 MG tablet Take 300 mg by mouth daily.        Marland Kitchen aspirin 325 MG tablet Take 325 mg by mouth daily.        Marland Kitchen losartan-hydrochlorothiazide (HYZAAR) 100-12.5 MG per tablet Take 1 tablet by mouth daily.  90 tablet  3  . simvastatin (ZOCOR) 40 MG tablet Take 40 mg by mouth at bedtime.          Allergies  Allergen Reactions  . Ramipril     Family History  Problem Relation Age of Onset  . Kidney failure Mother   . Cancer Mother   . Hypertension Father   . Arthritis Mother   . Stroke Brother 40    CVA  . Other Father     A&W    History   Social History  . Marital Status: Divorced    Spouse Name: N/A    Number of Children: N/A  . Years of Education: N/A   Occupational History  . CONSTRUCTION/MAINT     [employer]   Social History Main Topics  . Smoking status: Former Games developer  . Smokeless tobacco: Never Used   Comment: quit 1998  . Alcohol Use: Yes     Occasional  . Drug Use: No  . Sexually Active: Not on file   Other Topics Concern  . Not on file   Social History Narrative  . No narrative on file    ROS ALL NEGATIVE EXCEPT THOSE NOTED IN HPI  PE  General Appearance: well developed, well nourished in no acute distress, base HEENT: symmetrical face, PERRLA, good dentition  Neck: no JVD, thyromegaly, or adenopathy,  trachea midline Chest: symmetric without deformity Cardiac: PMI non-displaced, RRR, normal S1, S2, no gallop or murmur Lung: clear to ausculation and percussion Vascular: all pulses full without bruits  Abdominal: nondistended, nontender, good bowel sounds, no HSM, no bruits Extremities: no cyanosis, clubbing or edema, no sign of DVT, no varicosities  Skin: normal color, no rashes Neuro: alert and oriented x 3, non-focal Pysch: normal affect  EKG Normal sinus rhythm, ST segment changes laterally with T wave inversion. BMET    Component Value Date/Time   NA 138 04/01/2010 0915   K 4.4 04/01/2010 0915   CL 100 04/01/2010 0915   CO2 30 04/01/2010 0915   GLUCOSE 96 04/01/2010 0915   BUN 14 04/01/2010 0915   CREATININE 1.1 04/01/2010 0915   CALCIUM 9.4 04/01/2010 0915   GFRNONAA 79.61 12/23/2009 0826   GFRAA 101 12/20/2007 0920    Lipid Panel     Component Value Date/Time   CHOL 153 04/01/2010 0915   TRIG 136.0 04/01/2010 0915   HDL 43.30 04/01/2010 0915   CHOLHDL 4 04/01/2010 0915   VLDL 27.2 04/01/2010 0915   LDLCALC 83 04/01/2010 0915    CBC    Component Value Date/Time   WBC 7.5  12/23/2009 0826   RBC 4.60 12/23/2009 0826   HGB 14.9 12/23/2009 0826   HCT 42.7 12/23/2009 0826   PLT 229.0 12/23/2009 0826   MCV 93.0 12/23/2009 0826   MCHC 34.9 12/23/2009 0826   RDW 12.9 12/23/2009 0826   LYMPHSABS 1.6 12/23/2009 0826   MONOABS 0.9 12/23/2009 0826   EOSABS 0.2 12/23/2009 0826   BASOSABS 0.0 12/23/2009 0826

## 2011-06-26 ENCOUNTER — Other Ambulatory Visit: Payer: Self-pay | Admitting: Cardiology

## 2011-06-26 MED ORDER — LOSARTAN POTASSIUM-HCTZ 100-12.5 MG PO TABS: 1.0000 | ORAL_TABLET | Freq: Every day | ORAL | Status: DC

## 2011-06-28 ENCOUNTER — Other Ambulatory Visit: Payer: Self-pay | Admitting: Cardiology

## 2011-06-28 MED ORDER — LOSARTAN POTASSIUM-HCTZ 100-12.5 MG PO TABS: 1.0000 | ORAL_TABLET | Freq: Every day | ORAL | Status: DC

## 2011-06-28 NOTE — Telephone Encounter (Signed)
New msg Pt wants to get refill of losartan not brand name because it is too expensive. [facility]

## 2011-06-29 ENCOUNTER — Telehealth: Payer: Self-pay | Admitting: Cardiology

## 2011-06-29 NOTE — Telephone Encounter (Signed)
New Problem:    Patient called in because he is having issues with his medication refills.  Please call back.

## 2011-06-29 NOTE — Telephone Encounter (Signed)
Called patient and he explained that on the [facility] web site it was showing a brand name.  Pt wanted to be sure that he was getting the generic version of this medication and that he would not be paying full price for the brand name.  Pt called the pharmacy and was told that the generic would be sent.  Vista Mink, CMA

## 2011-09-26 ENCOUNTER — Telehealth: Payer: Self-pay | Admitting: Cardiology

## 2011-09-26 DIAGNOSIS — E785 Hyperlipidemia, unspecified: Secondary | ICD-10-CM

## 2011-09-26 NOTE — Telephone Encounter (Signed)
Pt wants to know if blood work needs to be done at next visit?

## 2011-09-26 NOTE — Telephone Encounter (Signed)
Spoke with pt. He states he does not have blood work checked at primary care. He will come in fasting when he sees Dr. Daleen Squibb on December 06, 2011

## 2011-09-28 NOTE — Addendum Note (Signed)
Addended by: Lisabeth Devoid F on: 09/28/2011 05:32 PM   Modules accepted: Orders

## 2011-10-26 ENCOUNTER — Telehealth: Payer: Self-pay | Admitting: Cardiology

## 2011-10-26 NOTE — Telephone Encounter (Signed)
Pt calling re upcoming appt in December, has question re lab work for a health care screening?

## 2011-10-27 ENCOUNTER — Ambulatory Visit (INDEPENDENT_AMBULATORY_CARE_PROVIDER_SITE_OTHER): Payer: 59

## 2011-10-27 ENCOUNTER — Other Ambulatory Visit (INDEPENDENT_AMBULATORY_CARE_PROVIDER_SITE_OTHER): Payer: 59

## 2011-10-27 DIAGNOSIS — R0989 Other specified symptoms and signs involving the circulatory and respiratory systems: Secondary | ICD-10-CM

## 2011-10-27 DIAGNOSIS — E785 Hyperlipidemia, unspecified: Secondary | ICD-10-CM

## 2011-10-27 LAB — BASIC METABOLIC PANEL
Calcium: 9 mg/dL (ref 8.4–10.5)
Sodium: 136 mEq/L (ref 135–145)

## 2011-10-27 LAB — HEPATIC FUNCTION PANEL
Bilirubin, Direct: 0.1 mg/dL (ref 0.0–0.3)
Total Protein: 7.5 g/dL (ref 6.0–8.3)

## 2011-10-27 LAB — LIPID PANEL: VLDL: 35.2 mg/dL (ref 0.0–40.0)

## 2011-11-01 NOTE — Telephone Encounter (Signed)
Pt came in to office on 10/27/11 for fasting lab work and blood pressure check for his upcoming ov with Dr. Daleen Squibb and insurance. Insurance information re: lab results, ht, wt, bp entered as pt needed this by today. Performed as nurse room visit by Ollen Gross RN as I was rescheduled to work in [place] that day. Mylo Red RN

## 2011-12-05 ENCOUNTER — Encounter: Payer: Self-pay | Admitting: *Deleted

## 2011-12-06 ENCOUNTER — Other Ambulatory Visit: Payer: 59

## 2011-12-06 ENCOUNTER — Encounter: Payer: Self-pay | Admitting: Cardiology

## 2011-12-06 ENCOUNTER — Ambulatory Visit (INDEPENDENT_AMBULATORY_CARE_PROVIDER_SITE_OTHER): Payer: 59 | Admitting: Cardiology

## 2011-12-06 VITALS — BP 162/90 | HR 80 | Ht 70.0 in | Wt 214.0 lb

## 2011-12-06 DIAGNOSIS — I2510 Atherosclerotic heart disease of native coronary artery without angina pectoris: Secondary | ICD-10-CM

## 2011-12-06 DIAGNOSIS — E669 Obesity, unspecified: Secondary | ICD-10-CM

## 2011-12-06 DIAGNOSIS — Z951 Presence of aortocoronary bypass graft: Secondary | ICD-10-CM

## 2011-12-06 DIAGNOSIS — I10 Essential (primary) hypertension: Secondary | ICD-10-CM

## 2011-12-06 DIAGNOSIS — E785 Hyperlipidemia, unspecified: Secondary | ICD-10-CM

## 2011-12-06 MED ORDER — SIMVASTATIN 20 MG PO TABS: 20.0000 mg | ORAL_TABLET | Freq: Every day | ORAL | Status: DC

## 2011-12-06 MED ORDER — AMLODIPINE BESYLATE 10 MG PO TABS: 10.0000 mg | ORAL_TABLET | Freq: Every day | ORAL | Status: DC

## 2011-12-06 NOTE — Assessment & Plan Note (Signed)
Not under good control. I'll increase his amlodipine to 10 mg per day. We'll decrease his simvastatin 20 mg by mouth each bedtime. He does not wish to switch to another statin because of price. We should not lose much ground on his LDL, perhaps 3%. He should still be under 100. Blood pressure goal less than 140/90. I've encouraged him to lose weight and continue walking.

## 2011-12-06 NOTE — Progress Notes (Signed)
HPI Michael [name] returns today for evaluation and management of his coronary artery disease and hypertension as well as hyperlipidemia. He's having no angina or chest pain. He did have some sporadic right upper lip numbness off and on that lasted sometimes for hours. There were no associated symptoms. He is now totally resolved. His primary care has lined him up to see a neurologist.  His weight is up 10 pounds. He is getting back into exercise. His last lipids showed a total cholesterol 158, HDL down from 43-38.5, VLDL is up from 27-35.2, LDL stable at 84. His fasting blood sugars normal. AST is up slightly.  His blood pressures been high and his primary care physician began amlodipine at 5 mg a day last week. He is still running high today checked several times by [place] in the office. He denies any edema.  Past Medical History  Diagnosis Date  . Coronary atherosclerosis of artery bypass graft   . Essential hypertension, benign   . Other and unspecified hyperlipidemia     Current Outpatient Prescriptions  Medication Sig Dispense Refill  . allopurinol (ZYLOPRIM) 300 MG tablet Take 1 tablet (300 mg total) by mouth daily.  90 tablet  3  . amLODipine (NORVASC) 5 MG tablet Take 1 tablet by mouth Daily.      Marland Kitchen aspirin 325 MG tablet Take 325 mg by mouth daily.        . carvedilol (COREG) 6.25 MG tablet Take 1 tablet (6.25 mg total) by mouth 2 (two) times daily with a meal.  180 tablet  3  . losartan-hydrochlorothiazide (HYZAAR) 100-12.5 MG per tablet Take 1 tablet by mouth daily.  90 tablet  1  . simvastatin (ZOCOR) 40 MG tablet Take 1 tablet (40 mg total) by mouth at bedtime.  90 tablet  3    Allergies  Allergen Reactions  . Ramipril     Family History  Problem Relation Age of Onset  . Kidney failure Mother   . Cancer Mother   . Hypertension Father   . Arthritis Mother   . Stroke Brother 40    CVA  . Healthy Father     History   Social History  . Marital Status: Divorced    Spouse  Name: N/A    Number of Children: N/A  . Years of Education: N/A   Occupational History  . CONSTRUCTION/MAINT     [employer]   Social History Main Topics  . Smoking status: Former Games developer  . Smokeless tobacco: Never Used     Comment: quit 1998  . Alcohol Use: Yes     Comment: Occasional  . Drug Use: No  . Sexually Active: Not on file   Other Topics Concern  . Not on file   Social History Narrative  . No narrative on file    ROS ALL NEGATIVE EXCEPT THOSE NOTED IN HPI  PE  General Appearance: well developed, well nourished in no acute distress, overweight HEENT: symmetrical face, PERRLA, good dentition  Neck: no JVD, thyromegaly, or adenopathy, trachea midline Chest: symmetric without deformity Cardiac: PMI non-displaced, RRR, normal S1, S2, no gallop or murmur Lung: clear to ausculation and percussion Vascular: all pulses full without bruits  Abdominal: nondistended, nontender, good bowel sounds, no HSM, no bruits Extremities: no cyanosis, clubbing or edema, no sign of DVT, no varicosities  Skin: normal color, no rashes Neuro: alert and oriented x 3, non-focal Pysch: normal affect  EKG Normal sinus rhythm, nonspecific T wave changes, no acute  changes.  BMET    Component Value Date/Time   NA 136 10/27/2011 0848   K 4.1 10/27/2011 0848   CL 100 10/27/2011 0848   CO2 26 10/27/2011 0848   GLUCOSE 92 10/27/2011 0848   BUN 17 10/27/2011 0848   CREATININE 1.1 10/27/2011 0848   CALCIUM 9.0 10/27/2011 0848   GFRNONAA 79.61 12/23/2009 0826   GFRAA 101 12/20/2007 0920    Lipid Panel     Component Value Date/Time   CHOL 158 10/27/2011 0848   TRIG 176.0* 10/27/2011 0848   HDL 38.50* 10/27/2011 0848   CHOLHDL 4 10/27/2011 0848   VLDL 35.2 10/27/2011 0848   LDLCALC 84 10/27/2011 0848    CBC    Component Value Date/Time   WBC 7.5 12/23/2009 0826   RBC 4.60 12/23/2009 0826   HGB 14.9 12/23/2009 0826   HCT 42.7 12/23/2009 0826   PLT 229.0 12/23/2009  0826   MCV 93.0 12/23/2009 0826   MCHC 34.9 12/23/2009 0826   RDW 12.9 12/23/2009 0826   LYMPHSABS 1.6 12/23/2009 0826   MONOABS 0.9 12/23/2009 0826   EOSABS 0.2 12/23/2009 0826   BASOSABS 0.0 12/23/2009 0826

## 2011-12-06 NOTE — Patient Instructions (Addendum)
Take 1/2 tablet zocor (20mg ) at bedtime each night. Increase Amlodipine to 10mg  daily  Your physician wants you to follow-up in: 1 year with Dr. Daleen Squibb. You will receive a reminder letter in the mail two months in advance. If you don't receive a letter, please call our office to schedule the follow-up appointment.  Remember to having fasting cholesterol lab work in October 2014 with your primary care doctor.

## 2011-12-06 NOTE — Assessment & Plan Note (Signed)
Stable. Continue secondary preventative therapy. Return the office in one year. 

## 2011-12-28 ENCOUNTER — Other Ambulatory Visit: Payer: Self-pay | Admitting: *Deleted

## 2011-12-28 DIAGNOSIS — I10 Essential (primary) hypertension: Secondary | ICD-10-CM

## 2011-12-28 DIAGNOSIS — G459 Transient cerebral ischemic attack, unspecified: Secondary | ICD-10-CM

## 2012-01-04 ENCOUNTER — Ambulatory Visit (HOSPITAL_COMMUNITY): Payer: 59 | Attending: Cardiology | Admitting: Radiology

## 2012-01-04 DIAGNOSIS — I10 Essential (primary) hypertension: Secondary | ICD-10-CM

## 2012-01-04 DIAGNOSIS — E785 Hyperlipidemia, unspecified: Secondary | ICD-10-CM | POA: Insufficient documentation

## 2012-01-04 DIAGNOSIS — Z87891 Personal history of nicotine dependence: Secondary | ICD-10-CM | POA: Insufficient documentation

## 2012-01-04 DIAGNOSIS — R072 Precordial pain: Secondary | ICD-10-CM

## 2012-01-04 DIAGNOSIS — G459 Transient cerebral ischemic attack, unspecified: Secondary | ICD-10-CM

## 2012-01-04 NOTE — Progress Notes (Signed)
Echocardiogram performed.  

## 2012-01-08 ENCOUNTER — Encounter: Payer: Self-pay | Admitting: Cardiology

## 2012-01-09 ENCOUNTER — Telehealth: Payer: Self-pay | Admitting: *Deleted

## 2012-01-09 ENCOUNTER — Encounter: Payer: Self-pay | Admitting: Cardiology

## 2012-01-09 MED ORDER — ALLOPURINOL 300 MG PO TABS: 300.0000 mg | ORAL_TABLET | Freq: Every day | ORAL | Status: DC

## 2012-01-09 NOTE — Telephone Encounter (Signed)
Per pt request allopurinol reordered, pt was seen recently.

## 2012-01-10 ENCOUNTER — Other Ambulatory Visit: Payer: Self-pay

## 2012-01-10 MED ORDER — LOSARTAN POTASSIUM-HCTZ 100-12.5 MG PO TABS: 1.0000 | ORAL_TABLET | Freq: Every day | ORAL | Status: DC

## 2012-02-05 ENCOUNTER — Encounter: Payer: Self-pay | Admitting: Cardiology

## 2012-02-07 ENCOUNTER — Other Ambulatory Visit: Payer: Self-pay | Admitting: *Deleted

## 2012-02-07 MED ORDER — CARVEDILOL 6.25 MG PO TABS: 6.2500 mg | ORAL_TABLET | Freq: Two times a day (BID) | ORAL | Status: DC

## 2012-02-13 ENCOUNTER — Other Ambulatory Visit: Payer: Self-pay | Admitting: Diagnostic Neuroimaging

## 2012-02-13 DIAGNOSIS — R209 Unspecified disturbances of skin sensation: Secondary | ICD-10-CM

## 2012-02-13 DIAGNOSIS — G459 Transient cerebral ischemic attack, unspecified: Secondary | ICD-10-CM

## 2012-02-17 ENCOUNTER — Other Ambulatory Visit: Payer: Self-pay

## 2012-02-23 ENCOUNTER — Ambulatory Visit
Admission: RE | Admit: 2012-02-23 | Discharge: 2012-02-23 | Disposition: A | Discharge status: Home / Self Care | Payer: 59 | Source: Ambulatory Visit | Attending: Diagnostic Neuroimaging | Admitting: Diagnostic Neuroimaging

## 2012-02-23 DIAGNOSIS — G459 Transient cerebral ischemic attack, unspecified: Secondary | ICD-10-CM

## 2012-02-23 DIAGNOSIS — R209 Unspecified disturbances of skin sensation: Secondary | ICD-10-CM

## 2012-02-23 MED ORDER — IOHEXOL 350 MG/ML SOLN
100.0000 mL | Freq: Once | INTRAVENOUS | Status: AC | PRN
  Administered 2012-02-23: 100 mL via INTRAVENOUS

## 2012-03-12 ENCOUNTER — Other Ambulatory Visit: Payer: Self-pay | Admitting: *Deleted

## 2012-03-22 ENCOUNTER — Ambulatory Visit (INDEPENDENT_AMBULATORY_CARE_PROVIDER_SITE_OTHER): Payer: 59 | Admitting: Family Medicine

## 2012-03-22 ENCOUNTER — Encounter: Payer: Self-pay | Admitting: Family Medicine

## 2012-03-22 VITALS — BP 138/88 | Temp 98.2°F | Ht 70.0 in | Wt 218.0 lb

## 2012-03-22 DIAGNOSIS — L0291 Cutaneous abscess, unspecified: Secondary | ICD-10-CM

## 2012-03-22 DIAGNOSIS — M109 Gout, unspecified: Secondary | ICD-10-CM

## 2012-03-22 DIAGNOSIS — L0390 Cellulitis, unspecified: Secondary | ICD-10-CM

## 2012-03-22 MED ORDER — DOXYCYCLINE HYCLATE 100 MG PO TABS: 100.0000 mg | ORAL_TABLET | Freq: Two times a day (BID) | ORAL | Status: DC

## 2012-03-22 NOTE — Patient Instructions (Signed)
Take all meds as directed

## 2012-03-22 NOTE — Progress Notes (Signed)
  Subjective:    Patient ID: Michael [name], male    DOB: [birth year], 57 y.o.   MRN: [id number]  HPI Bump on head. 6 days dur. Painful. Swelling sharp pain. Day and night. Found a bump. No obvious swelling. No fever. Quite tender at times. No discharge.   Review of Systems  Constitutional: Negative for fever and chills.  HENT: Negative for facial swelling.   Respiratory: Negative for apnea.   Cardiovascular: Negative for chest pain.  All other systems reviewed and are negative.       Objective:   Physical Exam  Constitutional: He appears well-developed and well-nourished.  HENT:  Head: Normocephalic.  Patch of erythema with a small central crusty cyst tender to palpation.  Eyes: Conjunctivae are normal. Pupils are equal, round, and reactive to light.  Neck: Normal range of motion. Neck supple.  Cardiovascular: Normal rate and regular rhythm.   Pulmonary/Chest: Effort normal and breath sounds normal.          Assessment & Plan:  Impression cellulitis. Discussed. Likely related to a sebaceous cyst or oil gland infection. Plan doxycycline 100 mg twice a day for 10 days. Symptomatic care discussed.

## 2012-03-24 ENCOUNTER — Encounter: Payer: Self-pay | Admitting: Family Medicine

## 2012-03-24 DIAGNOSIS — M109 Gout, unspecified: Secondary | ICD-10-CM | POA: Insufficient documentation

## 2012-03-25 ENCOUNTER — Encounter: Payer: Self-pay | Admitting: *Deleted

## 2012-03-25 ENCOUNTER — Encounter: Payer: Self-pay | Admitting: Vascular Surgery

## 2012-03-26 ENCOUNTER — Encounter: Payer: Self-pay | Admitting: Vascular Surgery

## 2012-03-26 ENCOUNTER — Ambulatory Visit (INDEPENDENT_AMBULATORY_CARE_PROVIDER_SITE_OTHER): Payer: 59 | Admitting: Vascular Surgery

## 2012-03-26 VITALS — BP 129/71 | HR 83 | Resp 16 | Ht 70.0 in | Wt 219.0 lb

## 2012-03-26 DIAGNOSIS — R209 Unspecified disturbances of skin sensation: Secondary | ICD-10-CM

## 2012-03-26 DIAGNOSIS — R200 Anesthesia of skin: Secondary | ICD-10-CM | POA: Insufficient documentation

## 2012-03-26 NOTE — Progress Notes (Signed)
[facility]   Patient name: Michael [name] MRN: [id number] DOB: 1955/07/30 Sex: male   Referred by: [facility]  Reason for referral:  Chief Complaint  Patient presents with  . New Evaluation    C/O Transient right facial numbness Nov. 2013, left ica stenosis 70% Dr. [name] .     HISTORY OF PRESENT ILLNESS: Patient presents today for discussion of extracranial cerebrovascular occlusive disease. He had several events of facial numbness. Initially the event that was periorbital and then the right facial. He has had a workup to include a recent CT angiogram. He had data this study was 02/23/2012. He specifically denies any right arm or leg weakness or numbness. He has had no episodes of aphasia or amaurosis fugax. The transient facial numbness resolved after several minutes and has not recurred since November 2013. Does have a history of premature atherosclerotic disease having undergone coronary bypass grafting in 1998. Fortunately he quit smoking in 1998 as well.  Past Medical History  Diagnosis Date  . Coronary atherosclerosis of artery bypass graft   . Essential hypertension, benign   . Other and unspecified hyperlipidemia   . Gout   . Allergy   . CAD (coronary artery disease)   . Peripheral vascular disease     Past Surgical History  Procedure Laterality Date  . Inguinal hernia repair  Aug. 2005    bilateral   . Coronary artery bypass graft  1999  . Ankle fracture surgery Left 1981    left  . Colonoscopy    . Fracture surgery      History   Social History  . Marital Status: Divorced    Spouse Name: N/A    Number of Children: N/A  . Years of Education: N/A   Occupational History  . CONSTRUCTION/MAINT     [employer]   Social History Main Topics  . Smoking status: Former Smoker    Quit date: 01/03/1996  . Smokeless tobacco: Never Used     Comment: quit 1998  . Alcohol Use: Yes     Comment: Occasional  . Drug Use: No  .  Sexually Active: Not on file   Other Topics Concern  . Not on file   Social History Narrative  . No narrative on file    Family History  Problem Relation Age of Onset  . Kidney failure Mother   . Cancer Mother   . Arthritis Mother   . Kidney disease Mother   . Hypertension Father   . Healthy Father   . Stroke Brother 40    CVA  . Hypertension Brother     Allergies as of 03/26/2012 - Review Complete 03/26/2012  Allergen Reaction Noted  . Anesthetics, amide Anaphylaxis 03/22/2012  . Ramipril Cough     Current Outpatient Prescriptions on File Prior to Visit  Medication Sig Dispense Refill  . allopurinol (ZYLOPRIM) 300 MG tablet Take 1 tablet (300 mg total) by mouth daily.  90 tablet  3  . amLODipine (NORVASC) 10 MG tablet Take 1 tablet (10 mg total) by mouth daily.  90 tablet  3  . aspirin 325 MG tablet Take 325 mg by mouth daily.        . carvedilol (COREG) 6.25 MG tablet Take 1 tablet (6.25 mg total) by mouth 2 (two) times daily with a meal.  180 tablet  3  . clopidogrel (PLAVIX) 75 MG tablet Take 75 mg by mouth daily.      Marland Kitchen losartan-hydrochlorothiazide (  HYZAAR) 100-12.5 MG per tablet Take 1 tablet by mouth daily.  90 tablet  1  . Multiple Vitamin (MULTI-VITAMIN DAILY PO) Take by mouth.      . simvastatin (ZOCOR) 20 MG tablet Take 1 tablet (20 mg total) by mouth at bedtime.  90 tablet  3  . doxycycline (VIBRA-TABS) 100 MG tablet Take 1 tablet (100 mg total) by mouth 2 (two) times daily.  20 tablet  0   No current facility-administered medications on file prior to visit.     REVIEW OF SYSTEMS:  Positives indicated with an "X"  CARDIOVASCULAR:  [ ]  chest pain   [ ]  chest pressure   [ ]  palpitations   [ ]  orthopnea   [ ]  dyspnea on exertion   [ ]  claudication   [ ]  rest pain   [ ]  DVT   [ ]  phlebitis PULMONARY:   [ ]  productive cough   [ ]  asthma   [ ]  wheezing NEUROLOGIC:   [ ]  weakness  [ ]  paresthesias  [ ]  aphasia  [ ]  amaurosis  [ ]  dizziness HEMATOLOGIC:   [ ]   bleeding problems   [ ]  clotting disorders MUSCULOSKELETAL:  [ ]  joint pain   [ ]  joint swelling GASTROINTESTINAL: [ ]   blood in stool  [ ]   hematemesis GENITOURINARY:  [ ]   dysuria  [ ]   hematuria PSYCHIATRIC:  [ ]  history of major depression INTEGUMENTARY:  [ ]  rashes  [ ]  ulcers CONSTITUTIONAL:  [ ]  fever   [ ]  chills  PHYSICAL EXAMINATION:  General: The patient is a well-nourished male, in no acute distress. Vital signs are BP 129/71  Pulse 83  Resp 16  Ht 5\' 10"  (1.778 m)  Wt 219 lb (99.338 kg)  BMI 31.42 kg/m2  SpO2 98% Pulmonary: There is a good air exchange bilaterally without wheezing or rales. Abdomen: Soft and non-tender with normal pitch bowel sounds. Musculoskeletal: There are no major deformities.  There is no significant extremity pain. Neurologic: No focal weakness or paresthesias are detected, Skin: There are no ulcer or rashes noted. Psychiatric: The patient has normal affect. Cardiovascular: There is a regular rate and rhythm without significant murmur appreciated. Carotid arteries without bruits bilaterally Pulse status 2+ radial pulses bilaterally  CT angiogram of his neck reveals predicted to 70% stenosis of his internal carotid artery on the left with in significant narrowing on the right. Of note he does have a very calcified acentric plaque in the bifurcation was normal carotid above the  Impression and Plan:  70% carotid stenosis on the left with possible symptoms related to this. A long discussion with the patient explained and possible to directly attribute his symptoms to his left carotid stenosis. I explained that he was slightly below our threshold limits for recommending surgery based on asymptomatic disease. He does have a very calcified eccentric lesion and symptoms that may be related to this. I have recommended endarterectomy for reduction of stroke risk. I explained the procedure in detail including potential for stroke with surgery approximately  1-1/2%. Also explained the low risk of other complications of bleeding bleeding, infection or cranial nerve injury. The patient wishes to proceed with surgery when he can arrange his with his work schedule. He'll notify should he develop any new neurologic deficits otherwise we'll proceed with elective surgery in the next several weeks    Cissy Galbreath [facility] [place] Office: [phone]

## 2012-03-27 ENCOUNTER — Other Ambulatory Visit: Payer: 59

## 2012-03-27 ENCOUNTER — Encounter: Payer: 59 | Admitting: Vascular Surgery

## 2012-04-10 ENCOUNTER — Other Ambulatory Visit: Payer: Self-pay

## 2012-04-11 ENCOUNTER — Telehealth: Payer: Self-pay

## 2012-04-11 NOTE — Telephone Encounter (Signed)
Called pt to resched cancled appt on 4/21 due to change in provider sched. No answer, left vmail on both numbers.

## 2012-04-17 ENCOUNTER — Encounter (HOSPITAL_COMMUNITY): Payer: Self-pay

## 2012-04-18 NOTE — Pre-Procedure Instructions (Signed)
Michael [name]  04/18/2012   Your procedure is scheduled on:  Mon, April 28 @ 7:30 AM  Report to [facility] at 5:30 AM.  Call this number if you have problems the morning of surgery: [phone]   Remember:   Do not eat food or drink liquids after midnight.   Take these medicines the morning of surgery with A SIP OF WATER: Allopurinol(Zyloprim),Amlodipine(Norvasc), and Carvedilol(Coreg)   Do not wear jewelry  Do not wear lotions, powders, or colognes. You may wear deodorant.  Men may shave face and neck.  Do not bring valuables to the hospital.  Contacts, dentures or bridgework may not be worn into surgery.  Leave suitcase in the car. After surgery it may be brought to your room.  For patients admitted to the hospital, checkout time is 11:00 AM the day of  discharge.   Patients discharged the day of surgery will not be allowed to drive  home. Special Instructions: Shower using CHG 2 nights before surgery and the night before surgery.  If you shower the day of surgery use CHG.  Use special wash - you have one bottle of CHG for all showers.  You should use approximately 1/3 of the bottle for each shower.   Please read over the following fact sheets that you were given: Pain Booklet, Coughing and Deep Breathing, Blood Transfusion Information, MRSA Information and Surgical Site Infection Prevention

## 2012-04-19 ENCOUNTER — Encounter (HOSPITAL_COMMUNITY)
Admission: RE | Admit: 2012-04-19 | Discharge: 2012-04-19 | Disposition: A | Discharge status: Home / Self Care | Payer: 59 | Source: Ambulatory Visit | Attending: Vascular Surgery | Admitting: Vascular Surgery

## 2012-04-19 ENCOUNTER — Encounter (HOSPITAL_COMMUNITY): Payer: Self-pay

## 2012-04-19 ENCOUNTER — Ambulatory Visit (HOSPITAL_COMMUNITY)
Admission: RE | Admit: 2012-04-19 | Discharge: 2012-04-19 | Disposition: A | Discharge status: Home / Self Care | Payer: 59 | Source: Ambulatory Visit | Attending: Anesthesiology | Admitting: Anesthesiology

## 2012-04-19 DIAGNOSIS — E785 Hyperlipidemia, unspecified: Secondary | ICD-10-CM | POA: Insufficient documentation

## 2012-04-19 DIAGNOSIS — Z87891 Personal history of nicotine dependence: Secondary | ICD-10-CM | POA: Insufficient documentation

## 2012-04-19 DIAGNOSIS — I6529 Occlusion and stenosis of unspecified carotid artery: Secondary | ICD-10-CM | POA: Insufficient documentation

## 2012-04-19 DIAGNOSIS — Z951 Presence of aortocoronary bypass graft: Secondary | ICD-10-CM | POA: Insufficient documentation

## 2012-04-19 DIAGNOSIS — I10 Essential (primary) hypertension: Secondary | ICD-10-CM | POA: Insufficient documentation

## 2012-04-19 DIAGNOSIS — M109 Gout, unspecified: Secondary | ICD-10-CM | POA: Insufficient documentation

## 2012-04-19 DIAGNOSIS — K219 Gastro-esophageal reflux disease without esophagitis: Secondary | ICD-10-CM | POA: Insufficient documentation

## 2012-04-19 DIAGNOSIS — I739 Peripheral vascular disease, unspecified: Secondary | ICD-10-CM | POA: Insufficient documentation

## 2012-04-19 DIAGNOSIS — I2510 Atherosclerotic heart disease of native coronary artery without angina pectoris: Secondary | ICD-10-CM | POA: Insufficient documentation

## 2012-04-19 HISTORY — DX: Urgency of urination: R39.15

## 2012-04-19 HISTORY — DX: Cervicalgia: M54.2

## 2012-04-19 HISTORY — DX: Other skin changes: R23.8

## 2012-04-19 HISTORY — DX: Gastro-esophageal reflux disease without esophagitis: K21.9

## 2012-04-19 HISTORY — DX: Angina pectoris, unspecified: I20.9

## 2012-04-19 HISTORY — DX: Dorsalgia, unspecified: M54.9

## 2012-04-19 HISTORY — DX: Malignant hyperthermia due to anesthesia, initial encounter: T88.3XXA

## 2012-04-19 HISTORY — DX: Spontaneous ecchymoses: R23.3

## 2012-04-19 LAB — CBC
HCT: 39.4 % (ref 39.0–52.0)
MCH: 32 pg (ref 26.0–34.0)
MCV: 88.7 fL (ref 78.0–100.0)
Platelets: 205 10*3/uL (ref 150–400)
RBC: 4.44 MIL/uL (ref 4.22–5.81)

## 2012-04-19 LAB — URINALYSIS, ROUTINE W REFLEX MICROSCOPIC
Glucose, UA: NEGATIVE mg/dL
Hgb urine dipstick: NEGATIVE
Ketones, ur: NEGATIVE mg/dL
Protein, ur: NEGATIVE mg/dL
pH: 7 (ref 5.0–8.0)

## 2012-04-19 LAB — PROTIME-INR
INR: 0.97 (ref 0.00–1.49)
Prothrombin Time: 12.8 seconds (ref 11.6–15.2)

## 2012-04-19 LAB — SURGICAL PCR SCREEN: MRSA, PCR: NEGATIVE

## 2012-04-19 LAB — COMPREHENSIVE METABOLIC PANEL
AST: 31 U/L (ref 0–37)
BUN: 15 mg/dL (ref 6–23)
CO2: 31 mEq/L (ref 19–32)
Calcium: 10 mg/dL (ref 8.4–10.5)
Creatinine, Ser: 1.03 mg/dL (ref 0.50–1.35)
GFR calc Af Amer: >90 mL/min (ref >90)
GFR calc non Af Amer: 79 mL/min — ABNORMAL LOW (ref >90)
Glucose, Bld: 107 mg/dL — ABNORMAL HIGH (ref 70–99)
Total Bilirubin: 0.4 mg/dL (ref 0.3–1.2)

## 2012-04-19 LAB — TYPE AND SCREEN: ABO/RH(D): O POS

## 2012-04-19 NOTE — Progress Notes (Addendum)
Dr.Thomas Wall is cardiologist-last visit in Dec 2013-last report in epic  Echo report in epic from 2008/2014 Stress test report in epic from 2003 Heart cath prior to 1999   EKG in epic from 12-06-11 Denies CXR in past yr  Medical Md is Dr.Leuking in [place]

## 2012-04-19 NOTE — Progress Notes (Addendum)
Anesthesia PAT Evaluation:  Patient is a 57 year old male scheduled for left CEA on 04/29/12 by Dr. Arbie Cookey.  He was found to have left ICA stenosis 70% without significant stenosis by CTA as part of a work-up for facial numbness.  Other history includes CAD s/p CABG '99, HTN, HLD, GERD, PVD, gout, bilateral hernia repair 08/08/01, former smoker.  Anesthesia history include possible MALIGNANT HYPERTHERMIA in 1981 during an orthopedic surgery at [facility] with Dr. Kathrene Bongo.  He cannot recall his symptoms, but he was having a screw removed from his ankle at the time.  He was told that they nearly lost him.  He's thinks they said his temperature was elevated.  He said he was told that he likely had malignant hyperthermia.  He has never had a muscle biopsy to confirm diagnosis.  (The screw had been placed a year earlier without any anesthesia complications.)    His Cardiologist is Dr. Daleen Squibb, last visit 12/2011.  No additional testing was ordered at that time and one year follow-up was recommended.  He said it has been about a year since he had vague chest pains or heart burn.  He reports he discussed his symptoms with Dr. Daleen Squibb who at the time did no seem concerned.  He did not have to take Nitro.  He has not had any additional chest pain symptoms since that time.  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  He has to do stairs and walk a fair amount without difficulty.  He denies SOB at rest and has mild chronic DOE which he feels has been stable.  He denies any significant edema.    Exam show a pleasant Caucasian male, in NAD.  Heart RRR, no significant murmurs noted.  Lungs clear.  Trace pretibial edema.  EKG on 12/06/11 showed NSR, non-specific ST/T wave abnormality.  Echo on 01/04/12 showed: - Left ventricle: The cavity size was normal. Wall thickness was increased in a pattern of mild LVH. Systolic function was normal. The estimated ejection fraction was in the range of 60% to 65%. Wall  motion was normal; there were no regional wall motion abnormalities. Left ventricular diastolic function parameters were normal. - Atrial septum: No defect or patent foramen ovale was identified.  Nuclear stress test on 12/23/09 showed: Probably normal stress nuclear study with inferior thinning but no ischemia. EF 69%.  Preoperative CXR and labs noted.  Discussed above including cardiac and MH history with Anesthesiologist Dr. Noreene Larsson who also briefly spoke with patient. Patient is already a first case.  His MH history is vague, but will plan trigger free induction due to his possible MH history.  (I did request his 2003 anesthesia records from [facility] as available.) If no acute change in his CV status then would anticipate he could proceed as planned.  Velna Ochs [facility]/Anesthesiology Phone [phone] 04/19/2012 1:08 PM  Addendum: 04/23/12 1335 I attempted to get patient's anesthesia records at [facility] from 2003, but staff including a supervisor were unable to readily locate these records.  I did notify OR scheduling to add malignant hyperthermia history to comments section.  It is already noted in his health history and pre-evaluation form.

## 2012-04-19 NOTE — Telephone Encounter (Signed)
Sent letter

## 2012-04-19 NOTE — Anesthesia Preprocedure Evaluation (Addendum)
Anesthesia Evaluation  Patient identified by MRN, date of birth, ID band Patient awake    Reviewed: Allergy & Precautions, H&P , NPO status , Patient's Chart, lab work & pertinent test results, reviewed documented beta blocker date and time   History of Anesthesia Complications (+) MALIGNANT HYPERTHERMIAHistory of anesthetic complications: possible malignant hyperthermia during ankle surgery years ago.  Airway Mallampati: II TM Distance: >3 FB Neck ROM: Full    Dental  (+) Teeth Intact and Dental Advidsory Given   Pulmonary COPDformer smoker (quit in 90's),  breath sounds clear to auscultation  Pulmonary exam normal       Cardiovascular hypertension, Pt. on medications and Pt. on home beta blockers + CAD (s/p CABG), + CABG and + Peripheral Vascular Disease Rhythm:Regular Rate:Normal  1/14 stress test: normal LVF, EF 60-65%, no ischemia   Neuro/Psych negative neurological ROS     GI/Hepatic Neg liver ROS, GERD-  Medicated and Controlled,  Endo/Other  negative endocrine ROSMorbid obesity  Renal/GU negative Renal ROS     Musculoskeletal   Abdominal (+) + obese,   Peds  Hematology   Anesthesia Other Findings   Reproductive/Obstetrics                         Anesthesia Physical Anesthesia Plan  ASA: III  Anesthesia Plan: General   Post-op Pain Management:    Induction: Intravenous  Airway Management Planned: Oral ETT  Additional Equipment: Arterial line  Intra-op Plan:   Post-operative Plan: Extubation in OR  Informed Consent: I have reviewed the patients History and Physical, chart, labs and discussed the procedure including the risks, benefits and alternatives for the proposed anesthesia with the patient or authorized representative who has indicated his/her understanding and acceptance.   Dental advisory given  Plan Discussed with: CRNA and Surgeon  Anesthesia Plan Comments: (Plan  routine monitors, A line, GETA with non-triggering anesthetic  )        Anesthesia Quick Evaluation

## 2012-04-22 ENCOUNTER — Ambulatory Visit: Payer: Self-pay | Admitting: Diagnostic Neuroimaging

## 2012-04-28 MED ORDER — DEXTROSE 5 % IV SOLN
1.5000 g | INTRAVENOUS | Status: AC
  Administered 2012-04-29: 1.5 g via INTRAVENOUS
  Filled 2012-04-28: qty 1.5

## 2012-04-29 ENCOUNTER — Inpatient Hospital Stay (HOSPITAL_COMMUNITY)
Admission: RE | Admit: 2012-04-29 | Discharge: 2012-04-30 | DRG: 039 | Disposition: A | Discharge status: Home / Self Care | Payer: 59 | Source: Ambulatory Visit | Attending: Vascular Surgery | Admitting: Vascular Surgery

## 2012-04-29 ENCOUNTER — Encounter (HOSPITAL_COMMUNITY): Payer: Self-pay | Admitting: *Deleted

## 2012-04-29 ENCOUNTER — Ambulatory Visit (HOSPITAL_COMMUNITY): Payer: 59 | Admitting: Certified Registered"

## 2012-04-29 ENCOUNTER — Encounter (HOSPITAL_COMMUNITY): Payer: Self-pay | Admitting: Vascular Surgery

## 2012-04-29 ENCOUNTER — Encounter (HOSPITAL_COMMUNITY)
Admission: RE | Disposition: A | Discharge status: Home / Self Care | Payer: Self-pay | Source: Ambulatory Visit | Attending: Vascular Surgery

## 2012-04-29 DIAGNOSIS — R3915 Urgency of urination: Secondary | ICD-10-CM | POA: Diagnosis present

## 2012-04-29 DIAGNOSIS — Z87891 Personal history of nicotine dependence: Secondary | ICD-10-CM

## 2012-04-29 DIAGNOSIS — M109 Gout, unspecified: Secondary | ICD-10-CM | POA: Diagnosis present

## 2012-04-29 DIAGNOSIS — Z6831 Body mass index (BMI) 31.0-31.9, adult: Secondary | ICD-10-CM

## 2012-04-29 DIAGNOSIS — Z7982 Long term (current) use of aspirin: Secondary | ICD-10-CM

## 2012-04-29 DIAGNOSIS — E669 Obesity, unspecified: Secondary | ICD-10-CM | POA: Diagnosis present

## 2012-04-29 DIAGNOSIS — R209 Unspecified disturbances of skin sensation: Secondary | ICD-10-CM | POA: Diagnosis present

## 2012-04-29 DIAGNOSIS — I6522 Occlusion and stenosis of left carotid artery: Secondary | ICD-10-CM

## 2012-04-29 DIAGNOSIS — I6529 Occlusion and stenosis of unspecified carotid artery: Principal | ICD-10-CM | POA: Diagnosis present

## 2012-04-29 DIAGNOSIS — E785 Hyperlipidemia, unspecified: Secondary | ICD-10-CM | POA: Diagnosis present

## 2012-04-29 DIAGNOSIS — I10 Essential (primary) hypertension: Secondary | ICD-10-CM | POA: Diagnosis present

## 2012-04-29 DIAGNOSIS — I2510 Atherosclerotic heart disease of native coronary artery without angina pectoris: Secondary | ICD-10-CM | POA: Diagnosis present

## 2012-04-29 HISTORY — PX: CAROTID ENDARTERECTOMY: SUR193

## 2012-04-29 HISTORY — PX: ENDARTERECTOMY: SHX5162

## 2012-04-29 SURGERY — ENDARTERECTOMY, CAROTID
Anesthesia: General | Site: Neck | Laterality: Left | Wound class: Clean

## 2012-04-29 MED ORDER — ONDANSETRON HCL 4 MG/2ML IJ SOLN
INTRAMUSCULAR | Status: DC | PRN
  Administered 2012-04-29: 4 mg via INTRAVENOUS

## 2012-04-29 MED ORDER — LACTATED RINGERS IV SOLN
INTRAVENOUS | Status: DC | PRN
  Administered 2012-04-29 (×2): via INTRAVENOUS

## 2012-04-29 MED ORDER — PHENYLEPHRINE HCL 10 MG/ML IJ SOLN
10.0000 mg | INTRAVENOUS | Status: DC | PRN
  Administered 2012-04-29: 10 ug/min via INTRAVENOUS
  Administered 2012-04-29: 15 ug/min via INTRAVENOUS

## 2012-04-29 MED ORDER — CLOPIDOGREL BISULFATE 75 MG PO TABS
75.0000 mg | ORAL_TABLET | Freq: Every day | ORAL | Status: DC
  Administered 2012-04-30: 75 mg via ORAL
  Filled 2012-04-29 (×3): qty 1

## 2012-04-29 MED ORDER — ALLOPURINOL 300 MG PO TABS
300.0000 mg | ORAL_TABLET | Freq: Every day | ORAL | Status: DC
  Filled 2012-04-29: qty 1

## 2012-04-29 MED ORDER — METOPROLOL TARTRATE 1 MG/ML IV SOLN: 2.0000 mg | INTRAVENOUS | Status: DC | PRN

## 2012-04-29 MED ORDER — ALUM & MAG HYDROXIDE-SIMETH 200-200-20 MG/5ML PO SUSP: 15.0000 mL | ORAL | Status: DC | PRN

## 2012-04-29 MED ORDER — MORPHINE SULFATE 2 MG/ML IJ SOLN
2.0000 mg | INTRAMUSCULAR | Status: DC | PRN
  Administered 2012-04-29: 2 mg via INTRAVENOUS
  Filled 2012-04-29: qty 1

## 2012-04-29 MED ORDER — DEXTROSE 5 % IV SOLN
1.5000 g | Freq: Two times a day (BID) | INTRAVENOUS | Status: AC
  Administered 2012-04-29 – 2012-04-30 (×2): 1.5 g via INTRAVENOUS
  Filled 2012-04-29 (×2): qty 1.5

## 2012-04-29 MED ORDER — FENTANYL CITRATE 0.05 MG/ML IJ SOLN
INTRAMUSCULAR | Status: DC | PRN
  Administered 2012-04-29 (×2): 100 ug via INTRAVENOUS
  Administered 2012-04-29: 50 ug via INTRAVENOUS
  Administered 2012-04-29: 100 ug via INTRAVENOUS
  Administered 2012-04-29 (×2): 50 ug via INTRAVENOUS

## 2012-04-29 MED ORDER — ASPIRIN EC 81 MG PO TBEC
81.0000 mg | DELAYED_RELEASE_TABLET | Freq: Every day | ORAL | Status: DC
  Filled 2012-04-29 (×2): qty 1

## 2012-04-29 MED ORDER — FENTANYL CITRATE 0.05 MG/ML IJ SOLN
INTRAMUSCULAR | Status: AC
  Filled 2012-04-29: qty 4

## 2012-04-29 MED ORDER — GLYCOPYRROLATE 0.2 MG/ML IJ SOLN
INTRAMUSCULAR | Status: DC | PRN
  Administered 2012-04-29: 0.6 mg via INTRAVENOUS

## 2012-04-29 MED ORDER — MIDAZOLAM HCL 5 MG/5ML IJ SOLN
INTRAMUSCULAR | Status: DC | PRN
  Administered 2012-04-29 (×3): 2 mg via INTRAVENOUS

## 2012-04-29 MED ORDER — SODIUM CHLORIDE 0.9 % IV SOLN: INTRAVENOUS | Status: DC

## 2012-04-29 MED ORDER — AMLODIPINE BESYLATE 10 MG PO TABS
10.0000 mg | ORAL_TABLET | Freq: Every day | ORAL | Status: DC
  Filled 2012-04-29 (×2): qty 1

## 2012-04-29 MED ORDER — MAGNESIUM SULFATE 40 MG/ML IJ SOLN
2.0000 g | Freq: Once | INTRAMUSCULAR | Status: AC | PRN
  Filled 2012-04-29: qty 50

## 2012-04-29 MED ORDER — ASPIRIN EC 325 MG PO TBEC: 325.0000 mg | DELAYED_RELEASE_TABLET | Freq: Every day | ORAL | Status: DC

## 2012-04-29 MED ORDER — CARVEDILOL 6.25 MG PO TABS
6.2500 mg | ORAL_TABLET | Freq: Two times a day (BID) | ORAL | Status: DC
  Administered 2012-04-30: 6.25 mg via ORAL
  Filled 2012-04-29 (×4): qty 1

## 2012-04-29 MED ORDER — HYDROCHLOROTHIAZIDE 12.5 MG PO CAPS
12.5000 mg | ORAL_CAPSULE | Freq: Every day | ORAL | Status: DC
  Filled 2012-04-29 (×2): qty 1

## 2012-04-29 MED ORDER — PROPOFOL INFUSION 10 MG/ML OPTIME
INTRAVENOUS | Status: DC | PRN
  Administered 2012-04-29: 100 ug/kg/min via INTRAVENOUS

## 2012-04-29 MED ORDER — SODIUM CHLORIDE 0.9 % IV SOLN
500.0000 mL | Freq: Once | INTRAVENOUS | Status: AC | PRN
  Administered 2012-04-29: 500 mL via INTRAVENOUS

## 2012-04-29 MED ORDER — DOPAMINE-DEXTROSE 3.2-5 MG/ML-% IV SOLN: 3.0000 ug/kg/min | INTRAVENOUS | Status: DC

## 2012-04-29 MED ORDER — DOCUSATE SODIUM 100 MG PO CAPS
100.0000 mg | ORAL_CAPSULE | Freq: Every day | ORAL | Status: DC
  Administered 2012-04-29: 100 mg via ORAL
  Filled 2012-04-29: qty 1

## 2012-04-29 MED ORDER — SODIUM CHLORIDE 0.9 % IV SOLN
INTRAVENOUS | Status: DC
  Administered 2012-04-29: 16:00:00 via INTRAVENOUS

## 2012-04-29 MED ORDER — PROMETHAZINE HCL 25 MG/ML IJ SOLN: 6.2500 mg | INTRAMUSCULAR | Status: DC | PRN

## 2012-04-29 MED ORDER — 0.9 % SODIUM CHLORIDE (POUR BTL) OPTIME
TOPICAL | Status: DC | PRN
  Administered 2012-04-29: 1000 mL

## 2012-04-29 MED ORDER — SENNOSIDES-DOCUSATE SODIUM 8.6-50 MG PO TABS
1.0000 | ORAL_TABLET | Freq: Every evening | ORAL | Status: DC | PRN
  Filled 2012-04-29: qty 1

## 2012-04-29 MED ORDER — SODIUM CHLORIDE 0.9 % IR SOLN
Status: DC | PRN
  Administered 2012-04-29: 08:00:00

## 2012-04-29 MED ORDER — ACETAMINOPHEN 325 MG PO TABS: 325.0000 mg | ORAL_TABLET | ORAL | Status: DC | PRN

## 2012-04-29 MED ORDER — ROCURONIUM BROMIDE 100 MG/10ML IV SOLN
INTRAVENOUS | Status: DC | PRN
  Administered 2012-04-29: 20 mg via INTRAVENOUS
  Administered 2012-04-29: 50 mg via INTRAVENOUS

## 2012-04-29 MED ORDER — BISACODYL 10 MG RE SUPP: 10.0000 mg | Freq: Every day | RECTAL | Status: DC | PRN

## 2012-04-29 MED ORDER — OXYCODONE-ACETAMINOPHEN 5-325 MG PO TABS
ORAL_TABLET | ORAL | Status: AC
  Filled 2012-04-29: qty 2

## 2012-04-29 MED ORDER — MIDAZOLAM HCL 2 MG/2ML IJ SOLN: 0.5000 mg | Freq: Once | INTRAMUSCULAR | Status: DC | PRN

## 2012-04-29 MED ORDER — LABETALOL HCL 5 MG/ML IV SOLN: 10.0000 mg | INTRAVENOUS | Status: DC | PRN

## 2012-04-29 MED ORDER — PANTOPRAZOLE SODIUM 40 MG PO TBEC
40.0000 mg | DELAYED_RELEASE_TABLET | Freq: Every day | ORAL | Status: DC
  Administered 2012-04-29: 40 mg via ORAL
  Filled 2012-04-29: qty 1

## 2012-04-29 MED ORDER — POTASSIUM CHLORIDE CRYS ER 20 MEQ PO TBCR: 20.0000 meq | EXTENDED_RELEASE_TABLET | Freq: Once | ORAL | Status: AC | PRN

## 2012-04-29 MED ORDER — ACETAMINOPHEN 650 MG RE SUPP: 325.0000 mg | RECTAL | Status: DC | PRN

## 2012-04-29 MED ORDER — ONDANSETRON HCL 4 MG/2ML IJ SOLN: 4.0000 mg | Freq: Four times a day (QID) | INTRAMUSCULAR | Status: DC | PRN

## 2012-04-29 MED ORDER — HYDRALAZINE HCL 20 MG/ML IJ SOLN: 10.0000 mg | INTRAMUSCULAR | Status: DC | PRN

## 2012-04-29 MED ORDER — GUAIFENESIN-DM 100-10 MG/5ML PO SYRP: 15.0000 mL | ORAL_SOLUTION | ORAL | Status: DC | PRN

## 2012-04-29 MED ORDER — OXYCODONE HCL 5 MG/5ML PO SOLN: 5.0000 mg | Freq: Once | ORAL | Status: DC | PRN

## 2012-04-29 MED ORDER — SIMVASTATIN 20 MG PO TABS
20.0000 mg | ORAL_TABLET | Freq: Every day | ORAL | Status: DC
  Administered 2012-04-29: 20 mg via ORAL
  Filled 2012-04-29 (×2): qty 1

## 2012-04-29 MED ORDER — PHENOL 1.4 % MT LIQD
1.0000 | OROMUCOSAL | Status: DC | PRN
  Administered 2012-04-29: 1 via OROMUCOSAL
  Filled 2012-04-29: qty 177

## 2012-04-29 MED ORDER — OXYCODONE HCL 5 MG PO TABS: 5.0000 mg | ORAL_TABLET | Freq: Once | ORAL | Status: DC | PRN

## 2012-04-29 MED ORDER — OXYCODONE-ACETAMINOPHEN 5-325 MG PO TABS
1.0000 | ORAL_TABLET | ORAL | Status: DC | PRN
  Administered 2012-04-29: 1 via ORAL
  Administered 2012-04-29: 2 via ORAL
  Filled 2012-04-29: qty 1

## 2012-04-29 MED ORDER — PROPOFOL 10 MG/ML IV BOLUS
INTRAVENOUS | Status: DC | PRN
  Administered 2012-04-29: 70 mg via INTRAVENOUS
  Administered 2012-04-29: 100 mg via INTRAVENOUS

## 2012-04-29 MED ORDER — LOSARTAN POTASSIUM-HCTZ 100-12.5 MG PO TABS: 1.0000 | ORAL_TABLET | Freq: Every day | ORAL | Status: DC

## 2012-04-29 MED ORDER — LOSARTAN POTASSIUM 50 MG PO TABS
100.0000 mg | ORAL_TABLET | Freq: Every day | ORAL | Status: DC
  Filled 2012-04-29 (×2): qty 2

## 2012-04-29 MED ORDER — LIDOCAINE HCL (CARDIAC) 20 MG/ML IV SOLN
INTRAVENOUS | Status: DC | PRN
  Administered 2012-04-29: 20 mg via INTRAVENOUS

## 2012-04-29 MED ORDER — NEOSTIGMINE METHYLSULFATE 1 MG/ML IJ SOLN
INTRAMUSCULAR | Status: DC | PRN
  Administered 2012-04-29: 4 mg via INTRAVENOUS

## 2012-04-29 MED ORDER — HEPARIN SODIUM (PORCINE) 1000 UNIT/ML IJ SOLN
INTRAMUSCULAR | Status: DC | PRN
  Administered 2012-04-29: 9000 [IU] via INTRAVENOUS

## 2012-04-29 MED ORDER — MEPERIDINE HCL 25 MG/ML IJ SOLN: 6.2500 mg | INTRAMUSCULAR | Status: DC | PRN

## 2012-04-29 MED ORDER — FENTANYL CITRATE 0.05 MG/ML IJ SOLN
25.0000 ug | INTRAMUSCULAR | Status: DC | PRN
  Administered 2012-04-29: 50 ug via INTRAVENOUS

## 2012-04-29 SURGICAL SUPPLY — 47 items
APL SKNCLS STERI-STRIP NONHPOA (GAUZE/BANDAGES/DRESSINGS) ×1
BENZOIN TINCTURE PRP APPL 2/3 (GAUZE/BANDAGES/DRESSINGS) ×2 IMPLANT
CANISTER SUCTION 2500CC (MISCELLANEOUS) ×2 IMPLANT
CANNULA VESSEL W/WING WO/VALVE (CANNULA) ×1 IMPLANT
CATH ROBINSON RED A/P 18FR (CATHETERS) ×2 IMPLANT
CLIP LIGATING EXTRA MED SLVR (CLIP) ×2 IMPLANT
CLIP LIGATING EXTRA SM BLUE (MISCELLANEOUS) ×2 IMPLANT
CLOTH BEACON ORANGE TIMEOUT ST (SAFETY) ×2 IMPLANT
COVER SURGICAL LIGHT HANDLE (MISCELLANEOUS) ×2 IMPLANT
CRADLE DONUT ADULT HEAD (MISCELLANEOUS) ×2 IMPLANT
DECANTER SPIKE VIAL GLASS SM (MISCELLANEOUS) IMPLANT
DRAIN HEMOVAC 1/8 X 5 (WOUND CARE) IMPLANT
DRAPE WARM FLUID 44X44 (DRAPE) ×2 IMPLANT
DRSG COVADERM 4X6 (GAUZE/BANDAGES/DRESSINGS) ×1 IMPLANT
ELECT REM PT RETURN 9FT ADLT (ELECTROSURGICAL) ×2
ELECTRODE REM PT RTRN 9FT ADLT (ELECTROSURGICAL) ×1 IMPLANT
EVACUATOR SILICONE 100CC (DRAIN) IMPLANT
GEL ULTRASOUND 20GR AQUASONIC (MISCELLANEOUS) IMPLANT
GLOVE BIO SURGEON STRL SZ 6.5 (GLOVE) ×3 IMPLANT
GLOVE BIOGEL PI IND STRL 6.5 (GLOVE) IMPLANT
GLOVE BIOGEL PI IND STRL 7.0 (GLOVE) IMPLANT
GLOVE BIOGEL PI INDICATOR 6.5 (GLOVE) ×1
GLOVE BIOGEL PI INDICATOR 7.0 (GLOVE) ×2
GLOVE SS BIOGEL STRL SZ 7.5 (GLOVE) ×1 IMPLANT
GLOVE SUPERSENSE BIOGEL SZ 7.5 (GLOVE) ×1
GOWN STRL NON-REIN LRG LVL3 (GOWN DISPOSABLE) ×6 IMPLANT
KIT BASIN OR (CUSTOM PROCEDURE TRAY) ×2 IMPLANT
KIT ROOM TURNOVER OR (KITS) ×2 IMPLANT
NEEDLE 22X1 1/2 (OR ONLY) (NEEDLE) IMPLANT
NS IRRIG 1000ML POUR BTL (IV SOLUTION) ×4 IMPLANT
PACK CAROTID (CUSTOM PROCEDURE TRAY) ×2 IMPLANT
PAD ARMBOARD 7.5X6 YLW CONV (MISCELLANEOUS) ×4 IMPLANT
SHUNT CAROTID BYPASS 10 (VASCULAR PRODUCTS) IMPLANT
SHUNT CAROTID BYPASS 12FRX15.5 (VASCULAR PRODUCTS) IMPLANT
SPECIMEN JAR SMALL (MISCELLANEOUS) ×2 IMPLANT
STRIP CLOSURE SKIN 1/2X4 (GAUZE/BANDAGES/DRESSINGS) ×2 IMPLANT
SUT ETHILON 3 0 PS 1 (SUTURE) IMPLANT
SUT PROLENE 6 0 CC (SUTURE) ×3 IMPLANT
SUT SILK 3 0 TIES 17X18 (SUTURE)
SUT SILK 3-0 18XBRD TIE BLK (SUTURE) IMPLANT
SUT VIC AB 3-0 SH 27 (SUTURE) ×4
SUT VIC AB 3-0 SH 27X BRD (SUTURE) ×2 IMPLANT
SUT VICRYL 4-0 PS2 18IN ABS (SUTURE) ×2 IMPLANT
SYR CONTROL 10ML LL (SYRINGE) IMPLANT
TOWEL OR 17X24 6PK STRL BLUE (TOWEL DISPOSABLE) ×2 IMPLANT
TOWEL OR 17X26 10 PK STRL BLUE (TOWEL DISPOSABLE) ×2 IMPLANT
WATER STERILE IRR 1000ML POUR (IV SOLUTION) ×2 IMPLANT

## 2012-04-29 NOTE — Preoperative (Signed)
Beta Blockers   Reason not to administer Beta Blockers:Not Applicable 

## 2012-04-29 NOTE — Anesthesia Postprocedure Evaluation (Signed)
  Anesthesia Post-op Note  Patient: Michael [name]  Procedure(s) Performed: Procedure(s) with comments: ENDARTERECTOMY CAROTID (Left) - with primary closure of artery  Patient Location: PACU  Anesthesia Type:General  Level of Consciousness: awake, alert , oriented and patient cooperative  Airway and Oxygen Therapy: Patient Spontanous Breathing and Patient connected to nasal cannula oxygen  Post-op Pain: none  Post-op Assessment: Post-op Vital signs reviewed, Patient's Cardiovascular Status Stable, Respiratory Function Stable, Patent Airway, No signs of Nausea or vomiting and Pain level controlled  Post-op Vital Signs: Reviewed and stable  Complications: No apparent anesthesia complications

## 2012-04-29 NOTE — Progress Notes (Signed)
Utilization review completed.  

## 2012-04-29 NOTE — H&P (Signed)
[name]  03/26/2012 2:00 PM   Office Visit  MRN:  [id number]   Description: 57 year old male  Provider: [name], MD  Department: [facility]        Diagnoses    Right facial numbness    -  Primary    782.0      Reason for Visit    New Evaluation    C/O Transient right facial numbness Nov. 2013, left ica stenosis 70% Dr. [name] .         Current Vitals - Last Recorded    BP Pulse Resp Ht Wt BMI    129/71 83 16 5\' 10"  (1.778 m) 219 lb (99.338 kg) 31.42 kg/m2       SpO2              98%                 Vitals History Recorded       Progress Notes    [name], MD at 03/26/2012  3:18 PM    Status: Signed                   [facility] [place]     Patient name: Michael [name]   MRN: [id number]        DOB: 1955-08-06            Sex: male     Referred by: [facility]   Reason for referral:   Chief Complaint   Patient presents with   .  New Evaluation       C/O Transient right facial numbness Nov. 2013, left ica stenosis 70% Dr. [name] .         HISTORY OF PRESENT ILLNESS: Patient presents today for discussion of extracranial cerebrovascular occlusive disease. He had several events of facial numbness. Initially the event that was periorbital and then the right facial. He has had a workup to include a recent CT angiogram. He had data this study was 02/23/2012. He specifically denies any right arm or leg weakness or numbness. He has had no episodes of aphasia or amaurosis fugax. The transient facial numbness resolved after several minutes and has not recurred since November 2013. Does have a history of premature atherosclerotic disease having undergone coronary bypass grafting in 1998. Fortunately he quit smoking in 1998 as well.    Past Medical History   Diagnosis  Date   .  Coronary atherosclerosis of artery bypass graft     .  Essential hypertension, benign     .  Other and unspecified hyperlipidemia     .   Gout     .  Allergy     .  CAD (coronary artery disease)     .  Peripheral vascular disease           Past Surgical History   Procedure  Laterality  Date   .  Inguinal hernia repair    Aug. 2005       bilateral    .  Coronary artery bypass graft    1999   .  Ankle fracture surgery  Left  1981       left   .  Colonoscopy       .  Fracture surgery             History       Social History   .  Marital Status:  Divorced       Spouse Name:  N/A       Number of Children:  N/A   .  Years of Education:  N/A       Occupational History   .  CONSTRUCTION/MAINT         [employer]       Social History Main Topics   .  Smoking status:  Former Smoker       Quit date:  01/03/1996   .  Smokeless tobacco:  Never Used         Comment: quit 1998   .  Alcohol Use:  Yes         Comment: Occasional   .  Drug Use:  No   .  Sexually Active:  Not on file       Other Topics  Concern   .  Not on file       Social History Narrative   .  No narrative on file         Family History   Problem  Relation  Age of Onset   .  Kidney failure  Mother     .  Cancer  Mother     .  Arthritis  Mother     .  Kidney disease  Mother     .  Hypertension  Father     .  Healthy  Father     .  Stroke  Brother  40       CVA   .  Hypertension  Brother           Allergies as of 03/26/2012 - Review Complete 03/26/2012   Allergen  Reaction  Noted   .  Anesthetics, amide  Anaphylaxis  03/22/2012   .  Ramipril  Cough           Current Outpatient Prescriptions on File Prior to Visit   Medication  Sig  Dispense  Refill   .  allopurinol (ZYLOPRIM) 300 MG tablet  Take 1 tablet (300 mg total) by mouth daily.   90 tablet   3   .  amLODipine (NORVASC) 10 MG tablet  Take 1 tablet (10 mg total) by mouth daily.   90 tablet   3   .  aspirin 325 MG tablet  Take 325 mg by mouth daily.           .  carvedilol (COREG) 6.25 MG tablet  Take 1 tablet (6.25 mg total) by mouth 2 (two) times daily  with a meal.   180 tablet   3   .  clopidogrel (PLAVIX) 75 MG tablet  Take 75 mg by mouth daily.         Marland Kitchen  losartan-hydrochlorothiazide (HYZAAR) 100-12.5 MG per tablet  Take 1 tablet by mouth daily.   90 tablet   1   .  Multiple Vitamin (MULTI-VITAMIN DAILY PO)  Take by mouth.         .  simvastatin (ZOCOR) 20 MG tablet  Take 1 tablet (20 mg total) by mouth at bedtime.   90 tablet   3   .  doxycycline (VIBRA-TABS) 100 MG tablet  Take 1 tablet (100 mg total) by mouth 2 (two) times daily.   20 tablet   0       No current facility-administered medications on file prior to visit.          REVIEW OF SYSTEMS:   Positives  indicated with an "X"   CARDIOVASCULAR:  [ ]  chest pain   [ ]  chest pressure   [ ]  palpitations   [ ]  orthopnea               [ ]  dyspnea on exertion   [ ]  claudication   [ ]  rest pain   [ ]  DVT   [ ]  phlebitis PULMONARY:   [ ]  productive cough   [ ]  asthma   [ ]  wheezing NEUROLOGIC:   [ ]  weakness  [ ]  paresthesias  [ ]  aphasia  [ ]  amaurosis  [ ]  dizziness HEMATOLOGIC:   [ ]  bleeding problems   [ ]  clotting disorders MUSCULOSKELETAL:  [ ]  joint pain   [ ]  joint swelling GASTROINTESTINAL: [ ]   blood in stool  [ ]   hematemesis GENITOURINARY:  [ ]   dysuria  [ ]   hematuria PSYCHIATRIC:  [ ]  history of major depression INTEGUMENTARY:  [ ]  rashes  [ ]  ulcers CONSTITUTIONAL:  [ ]  fever   [ ]  chills   PHYSICAL EXAMINATION:   General: The patient is a well-nourished male, in no acute distress. Vital signs are BP 129/71  Pulse 83  Resp 16  Ht 5\' 10"  (1.778 m)  Wt 219 lb (99.338 kg)  BMI 31.42 kg/m2  SpO2 98% Pulmonary: There is a good air exchange bilaterally without wheezing or rales. Abdomen: Soft and non-tender with normal pitch bowel sounds. Musculoskeletal: There are no major deformities.  There is no significant extremity pain. Neurologic: No focal weakness or paresthesias are detected, Skin: There are no ulcer or rashes noted. Psychiatric: The patient has  normal affect. Cardiovascular: There is a regular rate and rhythm without significant murmur appreciated. Carotid arteries without bruits bilaterally Pulse status 2+ radial pulses bilaterally   CT angiogram of his neck reveals predicted to 70% stenosis of his internal carotid artery on the left with in significant narrowing on the right. Of note he does have a very calcified acentric plaque in the bifurcation was normal carotid above the   Impression and Plan:   70% carotid stenosis on the left with possible symptoms related to this. A long discussion with the patient explained and possible to directly attribute his symptoms to his left carotid stenosis. I explained that he was slightly below our threshold limits for recommending surgery based on asymptomatic disease. He does have a very calcified eccentric lesion and symptoms that may be related to this. I have recommended endarterectomy for reduction of stroke risk. I explained the procedure in detail including potential for stroke with surgery approximately 1-1/2%. Also explained the low risk of other complications of bleeding bleeding, infection or cranial nerve injury. The patient wishes to proceed with surgery when he can arrange his with his work schedule. He'll notify should he develop any new neurologic deficits otherwise we'll proceed with elective surgery in the next several weeks       Meredeth Furber [facility] [place] Office: [phone]

## 2012-04-29 NOTE — Transfer of Care (Signed)
Immediate Anesthesia Transfer of Care Note  Patient: Michael [name]  Procedure(s) Performed: Procedure(s) with comments: ENDARTERECTOMY CAROTID (Left) - with primary closure of artery  Patient Location: PACU  Anesthesia Type:General  Level of Consciousness: awake, alert  and oriented  Airway & Oxygen Therapy: Patient Spontanous Breathing and Patient connected to face mask oxygen  Post-op Assessment: Report given to PACU RN and Post -op Vital signs reviewed and stable  Post vital signs: Reviewed and stable  Complications: No apparent anesthesia complications

## 2012-04-29 NOTE — Op Note (Signed)
[facility]  Patient name: Michael [name] MRN: [id number] DOB: 12/26/1955 Sex: male  04/29/2012 Pre-operative Diagnosis: Symptomatic left carotid stenosis Post-operative diagnosis:  Same Surgeon:  [name], M.D. Assistants:  nurse Procedure:    left carotid Endarterectomy  Anesthesia:  General Blood Loss:  See anesthesia record Specimens:  none  Indications for surgery:  Symptomatic carotid stenosis  Procedure in detail:  The patient was taken to the operating and placed in the supine position. The neck was prepped and draped in the usual sterile fashion. An incision was made anterior to the sternocleidomastoid muscle and continued with electrocautery through the platysma muscle. The muscle was retracted posteriorly and the carotid sheath was opened. The facial vein was ligated with 2-0 silk ties and divided. The common carotid artery was encircled with an umbilical tape and Rummel tourniquet. Dissection was continued onto the carotid bifurcation. The superior thyroid artery was controlled with a 2-0 silk Potts tie. The external carotid organ was encircled with a vessel loop and the internal carotid was encircled with umbilical tape and Rummel tourniquet. The hypoglossal and vagus nerves were identified and preserved.  The patient was given systemic heparinization. After adequate circulation time, the internal,external and common carotid arteries were occluded. The common carotid was opened with an 11 blade and the arteriotomy was continued with Potts scissors onto the internal carotid artery. The patient had a large internal carotid arteries a decision was made not to patch. There was a short endarterectomy and no shunt. The patient had widely patent right carotid. The endarterectomy was begun on the common carotid artery  plaque was divided proximally with Potts scissors. The endarterectomy was continued onto the carotid bifurcation. The external carotid was  endarterectomized by eversion technique and the internal carotid artery was endarterectomized in an open fashion. Remaining debris was removed from the endarterectomy plane. The arteriotomy was closed with a 6-0 Prolene suture beginning at the superior and inferior aspect of the incision. Prior to completing the anastomosis, the shunt was removed and the usual flushing maneuvers were undertaken. The anastomosis was then completed and flow was restored first to the external and then the internal carotid artery. Excellent flow characteristics were noted with hand-held Doppler in the internal and external carotid arteries.  The patient was given protamine to reverse the heparin. Hemostasis was obtained with electrocautery. The wounds were irrigated with saline. The wound was closed by first reapproximating the sternocleidomastoid muscle over the carotid artery with interrupted 3-0 Vicryl sutures. Next, the platysma was closed with a running 3-0 Vicryl suture. The skin was closed with a 4-0 subcuticular Vicryl suture. Benzoin and Steri-Strips were applied to the incision. A sterile dressing was placed over the incision. All sponge and needle counts were correct. The patient was awakened in the operating room, neurologically intact. They were transferred to the PACU in stable condition.  Carotid stenosis at surgery: 70%  Disposition:  To PACU in stable condition,neurologically intact  Relevant Operative Details:  Focal stenosis at the bifurcation  [name], M.D. [facility] [place] Office: [phone] Pager:  [phone]

## 2012-04-29 NOTE — Progress Notes (Addendum)
Pt admitted to 3300 from PACU. Oriented to unit and room. Safety discussed and call bell with in reach. VSS. Pt has slight left sided facial weakness, pacu RN stated this was his baseline since surgery. Will continue to monitor.  Elijah Birk, RN

## 2012-04-29 NOTE — Interval H&P Note (Signed)
History and Physical Interval Note:  04/29/2012 7:26 AM  Michael [name]  has presented today for surgery, with the diagnosis of Left Internal Carotid Artery Stenosis  The various methods of treatment have been discussed with the patient and family. After consideration of risks, benefits and other options for treatment, the patient has consented to  Procedure(s): ENDARTERECTOMY CAROTID (Left) as a surgical intervention .  The patient's history has been reviewed, patient examined, no change in status, stable for surgery.  I have reviewed the patient's chart and labs.  Questions were answered to the patient's satisfaction.     Koral Thaden

## 2012-04-30 ENCOUNTER — Encounter (HOSPITAL_COMMUNITY): Payer: Self-pay | Admitting: Vascular Surgery

## 2012-04-30 ENCOUNTER — Telehealth: Payer: Self-pay | Admitting: Vascular Surgery

## 2012-04-30 LAB — BASIC METABOLIC PANEL
CO2: 27 mEq/L (ref 19–32)
Chloride: 103 mEq/L (ref 96–112)
GFR calc Af Amer: >90 mL/min (ref >90)
Potassium: 3.7 mEq/L (ref 3.5–5.1)
Sodium: 136 mEq/L (ref 135–145)

## 2012-04-30 LAB — CBC
Platelets: 140 10*3/uL — ABNORMAL LOW (ref 150–400)
RBC: 3.62 MIL/uL — ABNORMAL LOW (ref 4.22–5.81)
RDW: 12.7 % (ref 11.5–15.5)
WBC: 6.7 10*3/uL (ref 4.0–10.5)

## 2012-04-30 MED ORDER — OXYCODONE HCL 5 MG PO TABS: 5.0000 mg | ORAL_TABLET | Freq: Four times a day (QID) | ORAL | Status: DC | PRN

## 2012-04-30 NOTE — Progress Notes (Signed)
Discussed discharge instructions and medications with patient. Patient verbalized understanding with all questions answered. VSS. Pt discharged home with family.  Adryan Shin,RN  

## 2012-04-30 NOTE — Discharge Summary (Signed)
[facility] Discharge Summary  Michael [name] 1955/03/12 57 y.o. male  [id number]  Admission Date: 04/29/2012  Discharge Date: 04/30/12  Physician: [name], MD  Admission Diagnosis: Left Internal Carotid Artery Stenosis   HPI:   This is a 57 y.o. male who presents today for discussion of extracranial cerebrovascular occlusive disease. He had several events of facial numbness. Initially the event that was periorbital and then the right facial. He has had a workup to include a recent CT angiogram. He had data this study was 02/23/2012. He specifically denies any right arm or leg weakness or numbness. He has had no episodes of aphasia or amaurosis fugax. The transient facial numbness resolved after several minutes and has not recurred since November 2013. Does have a history of premature atherosclerotic disease having undergone coronary bypass grafting in 1998. Fortunately he quit smoking in 1998 as well.   Hospital Course:  The patient was admitted to the hospital and taken to the operating room on 04/29/2012 and underwent left carotid endarterectomy.  The pt tolerated the procedure well and was transported to the PACU in good condition.   By POD 1, the pt neuro status is in tact.  He does have a mild left facial droop due to a low marginal mandibular nerve and retraction during the operation.  This should improve over the next few weeks.  The remainder of the hospital course consisted of increasing mobilization and increasing intake of solids without difficulty.    Recent Labs  04/30/12 0428  NA 136  K 3.7  CL 103  CO2 27  GLUCOSE 107*  BUN 11  CALCIUM 8.3*    Recent Labs  04/30/12 0428  WBC 6.7  HGB 11.6*  HCT 32.3*  PLT 140*   No results found for this basename: INR,  in the last 72 hours  Discharge Instructions:   The patient is discharged to home with extensive instructions on wound care and progressive ambulation.  They are instructed not  to drive or perform any heavy lifting until returning to see the physician in his office.    Discharge Diagnosis:  Left Internal Carotid Artery Stenosis  Secondary Diagnosis: Patient Active Problem List   Diagnosis Date Noted  . Right facial numbness 03/26/2012  . Gout 03/24/2012  . Obesity (BMI 30-39.9) 12/06/2011  . Coronary artery disease 12/15/2010  . S/P coronary artery bypass graft x 4 12/15/2010  . HYPERLIPIDEMIA 12/15/2008  . HYPERTENSION, CONTROLLED 12/15/2008   Past Medical History  Diagnosis Date  . Coronary atherosclerosis of artery bypass graft   . Other and unspecified hyperlipidemia     takes Simvastatin daily  . Gout     takes Allopurinol daily  . Allergy   . CAD (coronary artery disease)   . Peripheral vascular disease   . Malignant hyperthermia     Potential but not Confirmed  . Essential hypertension, benign     Takes Amlodipine and Losartan-HCTZ daily;takes Coreg daily  . Anginal pain     last time a yr ago  . Back pain   . Neck pain     yrs ago and saw a chiropractor occasionally  . GERD (gastroesophageal reflux disease)     was on Prilosec;is not currently on any meds  . Urinary urgency   . Bruises easily      Medication List    TAKE these medications       allopurinol 300 MG tablet  Commonly known as:  ZYLOPRIM  Take 1 tablet (300 mg total) by mouth daily.     amLODipine 10 MG tablet  Commonly known as:  NORVASC  Take 1 tablet (10 mg total) by mouth daily.     aspirin EC 81 MG tablet  Take 81 mg by mouth daily.     carvedilol 6.25 MG tablet  Commonly known as:  COREG  Take 1 tablet (6.25 mg total) by mouth 2 (two) times daily with a meal.     clopidogrel 75 MG tablet  Commonly known as:  PLAVIX  Take 75 mg by mouth daily.     losartan-hydrochlorothiazide 100-12.5 MG per tablet  Commonly known as:  HYZAAR  Take 1 tablet by mouth daily.     MULTI-VITAMIN DAILY PO  Take 1 tablet by mouth daily.     oxyCODONE 5 MG immediate  release tablet  Commonly known as:  ROXICODONE  Take 1 tablet (5 mg total) by mouth every 6 (six) hours as needed for pain.     simvastatin 20 MG tablet  Commonly known as:  ZOCOR  Take 1 tablet (20 mg total) by mouth at bedtime.        Roxicodone #30 No Refill  Disposition: home  Patient's condition: is Good  Follow up: 1. Dr.  Arbie Cookey in 2 weeks.  --- For [name] Registry use ---   Modified Rankin score at D/C (0-6): 0   IV medication needed for:  1. Hypertension: No 2. Hypotension: No  Post-op Complications: No  1. Post-op CVA or TIA: No  If yes: Event classification (right eye, left eye, right cortical, left cortical, verterobasilar, other): n/a  If yes: Timing of event (intra-op, <6 hrs post-op, >=6 hrs post-op, unknown): n/a  2. CN injury: No  If yes: CN n/a injuried   3. Myocardial infarction: No  If yes: Dx by (EKG or clinical, Troponin): n/a  4.  CHF: No  5.  Dysrhythmia (new): No  6. Wound infection: No  7. Reperfusion symptoms: No  8. Return to OR: No  If yes: return to OR for (bleeding, neurologic, other CEA incision, other): n/a  Statin use:  Yes Zocor  Discharge medications: Statin use:  Yes If No: [ ]  For Medical reasons, [ ]  Non-compliant ASA use:  No  If No: [ x] For Medical reasons-pt was taken off of ASA and placed on Plavix at time of event, [ ]  Non-compliant Beta blocker use:  Yes If No: [ ]  For Medical reasons, [ ]  Non-compliant ACE-Inhibitor use:  No-pt is on ARB If No: [ ]  For Medical reasons, [ ]  Non-compliant P2Y12 Antagonist use: [ ]  None, [x ] Plavix, [ ]  Plasugrel, [ ]  Ticlopinine, [ ]  Ticagrelor, [ ]  Other, [ ]  No for medical reason, [ ]  Non-compliant Anti-coagulant use:  [x ] None, [ ]  Warfarin, [ ]  Rivaroxaban, [ ]  Dabigatran, [ ]  Other, [ ]  No for medical reason, [ ]  Non-compliant  Doreatha Massed, PA-C [facility] [phone] 04/30/2012  7:42 AM

## 2012-04-30 NOTE — Progress Notes (Addendum)
[facility] Progress Note  04/30/2012 7:29 AM @ORDAYSPSTt @  Subjective:  Sore throat, but no difficulty swallowing  Afebrile  90's-120's systolic HR 50's-80's regular 08% RA  Filed Vitals:   04/30/12 0405  BP: 120/65  Pulse: 84  Temp: 98.7 F (37.1 C)  Resp: 18     Physical Exam: Neuro:  In tact, mild left facial droop Incision:  C/d/i with steri strips in tact.  CBC    Component Value Date/Time   WBC 6.7 04/30/2012 0428   RBC 3.62* 04/30/2012 0428   HGB 11.6* 04/30/2012 0428   HCT 32.3* 04/30/2012 0428   PLT 140* 04/30/2012 0428   MCV 89.2 04/30/2012 0428   MCH 32.0 04/30/2012 0428   MCHC 35.9 04/30/2012 0428   RDW 12.7 04/30/2012 0428   LYMPHSABS 1.6 12/23/2009 0826   MONOABS 0.9 12/23/2009 0826   EOSABS 0.2 12/23/2009 0826   BASOSABS 0.0 12/23/2009 0826    BMET    Component Value Date/Time   NA 136 04/30/2012 0428   K 3.7 04/30/2012 0428   CL 103 04/30/2012 0428   CO2 27 04/30/2012 0428   GLUCOSE 107* 04/30/2012 0428   BUN 11 04/30/2012 0428   CREATININE 0.87 04/30/2012 0428   CALCIUM 8.3* 04/30/2012 0428   GFRNONAA >90 04/30/2012 0428   GFRAA >90 04/30/2012 0428     Intake/Output Summary (Last 24 hours) at 04/30/12 0729 Last data filed at 04/30/12 0600  Gross per 24 hour  Intake   4110 ml  Output   2200 ml  Net   1910 ml      Assessment/Plan:  This is a 57 y.o. male who is s/p left CEA 1 Day Post-Op  -pt is doing well this am. -pt neuro exam is in tact -pt has ambulated -pt has voided. -discharge home today and will f/u with Dr. Arbie Cookey in 2 weeks. -pt not on ASA at home, but on Plavix.  Neurologist started pt on Plavix and discontinued his ASA with his neurological event.  Will leave it to him to resume plavix or restart ASA.  From surgical standpoint, pt only needs ASA.  Doreatha Massed, PA-C [facility] [phone]  I have examined the patient, reviewed and agree with above. Comfortable postoperative day #1.  Appears to have mild marginal mandibular nerve palsy on the left. Discussed with the patient that this will resolve spontaneously. No focal neurologic deficits. For discharge today. Kiara Mcdowell, MD 04/30/2012 8:14 AM

## 2012-04-30 NOTE — Telephone Encounter (Signed)
Pt's vm not set up, gave appt info to father juilian, sent letter - kf

## 2012-04-30 NOTE — Telephone Encounter (Signed)
Message copied by Margaretmary Eddy on Tue Apr 30, 2012 12:37 PM ------      Message from: Sharee Pimple      Created: Tue Apr 30, 2012  7:49 AM      Regarding: schedule                   ----- Message -----         From: Dara Lords, PA-C         Sent: 04/30/2012   7:49 AM           To: Sharee Pimple, CMA            S/p left CEA 04/29/12.  F/u with Dr. Arbie Cookey in 2 weeks.            Thanks,      Lelon Mast ------

## 2012-05-02 ENCOUNTER — Telehealth: Payer: Self-pay

## 2012-05-02 NOTE — Telephone Encounter (Signed)
Phone call from pt.  C/o "ecruciating pain in back of neck that radiates into left shoulder, with certain movements of head, and in opening mouth."  Questions if this is typical of symptoms following carotid surgery.  Pt. States has very minor incisional pain (L) neck.  States has "some puffiness" in left neck-incisional area; states incision is intact.  Denies any difficulty with swallowing. Relates hx of having severe neck pain approx. 1999-2000, and saw a neurologist @ that time; unable to remember the specific diagnosis at that time; rec'd Chiropractic care, and stated condition got better. Reported pt's symptoms to Dr. Arbie Cookey.  Recommended to apply heat, and use Ibuprofen for inflammation. Per Dr. Arbie Cookey, there may be some nerve irritation from the surgery; advise pt. to try the heat/Ibuprofen, and to give it more time to resolve.  Pt. advised of Dr. Bosie Helper recommendations.  Encouraged to call office if symptoms don't improve.  Verb. understanding.

## 2012-05-13 ENCOUNTER — Encounter: Payer: Self-pay | Admitting: Vascular Surgery

## 2012-05-14 ENCOUNTER — Encounter: Payer: Self-pay | Admitting: Vascular Surgery

## 2012-05-14 ENCOUNTER — Ambulatory Visit (INDEPENDENT_AMBULATORY_CARE_PROVIDER_SITE_OTHER): Payer: 59 | Admitting: Vascular Surgery

## 2012-05-14 VITALS — BP 149/79 | HR 90 | Resp 16 | Ht 70.0 in | Wt 215.0 lb

## 2012-05-14 DIAGNOSIS — I6529 Occlusion and stenosis of unspecified carotid artery: Secondary | ICD-10-CM

## 2012-05-14 DIAGNOSIS — Z9889 Other specified postprocedural states: Secondary | ICD-10-CM

## 2012-05-14 NOTE — Progress Notes (Addendum)
[facility] [place]  Established Carotid Patient  History of Present Illness  Michael [name] is a 57 y.o. (Aug 30, 1955) male who presents with chief complaint: post-op left carotid enterectomy 2 weeks after surgery.  The  patient has no history of TIA or stroke symptom.  The patient has not had amaurosis fugax or monocular blindness.  The patient has right sided facial drooping.  No hemiplegia.  The patient has not had receptive or expressive aphasia.      Past Medical History, Past Surgical History, Social History, Family History, Medications, Allergies, and Review of Systems are unchanged from previous evaluation on 03/26/2012.  Physical Examination  Filed Vitals:   05/14/12 1427 05/14/12 1428  BP: 153/81 149/79  Pulse: 92 90  Resp: 16   Height: 5\' 10"  (1.778 m)   Weight: 215 lb (97.523 kg)    Body mass index is 30.85 kg/(m^2).  Left neck incision is well healed  Palpable radial pulses equal bilateral No carotid bruit is auscultated.  Michael [name] is a 57 y.o. male who presents with 2 week follow up s/p left carotid enterectomy. He will come back in 6 months for bilateral carotid duplex study. If he has any questions or concerns he will call sooner.   Thomasena Edis Alsace Dowd [facility] PA-C [facility] [place] Office: [phone]   05/14/2012, 2:48 PM   I have examined the patient, reviewed and agree with above.  EARLY, TODD, MD 05/14/2012 3:54 PM

## 2012-05-15 NOTE — Addendum Note (Signed)
Addended by: Sharee Pimple on: 05/15/2012 10:24 AM   Modules accepted: Orders

## 2012-06-18 ENCOUNTER — Other Ambulatory Visit: Payer: Self-pay | Admitting: Cardiology

## 2012-09-20 ENCOUNTER — Other Ambulatory Visit: Payer: Self-pay | Admitting: Cardiology

## 2012-09-27 ENCOUNTER — Encounter: Payer: Self-pay | Admitting: Cardiology

## 2012-10-14 ENCOUNTER — Encounter: Payer: Self-pay | Admitting: Family Medicine

## 2012-10-14 ENCOUNTER — Telehealth (INDEPENDENT_AMBULATORY_CARE_PROVIDER_SITE_OTHER): Payer: 59 | Admitting: Family Medicine

## 2012-10-14 VITALS — BP 128/68 | Ht 70.0 in | Wt 226.2 lb

## 2012-10-14 DIAGNOSIS — J309 Allergic rhinitis, unspecified: Secondary | ICD-10-CM

## 2012-10-14 MED ORDER — FLUTICASONE PROPIONATE 50 MCG/ACT NA SUSP: 2.0000 | Freq: Every day | NASAL | Status: DC

## 2012-10-14 MED ORDER — FEXOFENADINE HCL 180 MG PO TABS: 180.0000 mg | ORAL_TABLET | Freq: Every day | ORAL | Status: DC

## 2012-10-14 NOTE — Progress Notes (Signed)
  Subjective:    Patient ID: Michael [name], male    DOB: [birth year], 57 y.o.   MRN: [id number]  HPI Patient is here today b/c he has been suffering from a dry, sore nose. He states it has been dry and cracking for 3 weeks now. He has tried ointments to moisturize the nares, but it still seems to be dry after the ointment wears off. It has bled due to the dryness.  Dryness in nostrils. Gets sore and cracked, a little allergy drainage in the fall,  A bit sensitive  Hx of cold sores    Review of Systems No major epistaxis slight bleeding. No headache no abdominal pain no cough no sore throat ROS otherwise negative    Objective:   Physical Exam  Alert hydration good. HEENT moderate nasal congestion nasal passages irritated slightly hyperemic. Pharynx normal neck supple. Lungs clear heart regular rate and rhythm with significant murmur which is baseline for patient     Assessment & Plan:  Impression allergic rhinitis with secondary cutaneous changes. Plan Flonase 2 sprays each nostril daily. Daily antihistamine. Add saline spray. Symptomatic care discussed. WSL

## 2012-10-14 NOTE — Patient Instructions (Signed)
Use saline spray regularly along with the meds

## 2012-10-29 ENCOUNTER — Other Ambulatory Visit: Payer: Self-pay | Admitting: Cardiology

## 2012-10-31 ENCOUNTER — Telehealth: Payer: Self-pay | Admitting: Cardiology

## 2012-10-31 DIAGNOSIS — E785 Hyperlipidemia, unspecified: Secondary | ICD-10-CM

## 2012-10-31 NOTE — Telephone Encounter (Signed)
New Problem:  Pt states he would like to schedule a nurse visit like he did last year w/ Debbie. Pt states he has the same document that he had last year that he needs to have completed again for insurance purposes. Pt would like to set up an appt with the nurse and for labs as soon as possible. Please advise

## 2012-11-01 NOTE — Telephone Encounter (Signed)
Pt yearly fasting labs are due including blood pressure & weight for work. Pt has appointment with Dr. Delton See on 12/06/12. He is a former pt of Dr. Tillman Abide ordered & nurse room visit scheduled for 11/07/12 Mylo Red RN

## 2012-11-04 NOTE — Telephone Encounter (Signed)
Its ok with me if he meets with you regarding paperwork. I will see him in the clinic as scheduled on 12/06/12. Michael [name], H

## 2012-11-07 ENCOUNTER — Other Ambulatory Visit: Payer: Self-pay | Admitting: Vascular Surgery

## 2012-11-07 ENCOUNTER — Other Ambulatory Visit: Payer: 59

## 2012-11-07 ENCOUNTER — Encounter: Payer: Self-pay | Admitting: *Deleted

## 2012-11-07 ENCOUNTER — Ambulatory Visit: Payer: 59 | Admitting: *Deleted

## 2012-11-07 ENCOUNTER — Other Ambulatory Visit: Payer: Self-pay

## 2012-11-07 VITALS — BP 146/86 | HR 80 | Ht 70.0 in | Wt 225.8 lb

## 2012-11-07 DIAGNOSIS — I10 Essential (primary) hypertension: Secondary | ICD-10-CM

## 2012-11-07 DIAGNOSIS — E785 Hyperlipidemia, unspecified: Secondary | ICD-10-CM

## 2012-11-07 DIAGNOSIS — Z48812 Encounter for surgical aftercare following surgery on the circulatory system: Secondary | ICD-10-CM

## 2012-11-07 LAB — BASIC METABOLIC PANEL
BUN: 19 mg/dL (ref 6–23)
CO2: 30 mEq/L (ref 19–32)
Calcium: 9.5 mg/dL (ref 8.4–10.5)
Chloride: 99 mEq/L (ref 96–112)
Creatinine, Ser: 1.1 mg/dL (ref 0.4–1.5)
GFR: 75.4 mL/min (ref >60.00)
Glucose, Bld: 114 mg/dL — ABNORMAL HIGH (ref 70–99)
Potassium: 4.2 mEq/L (ref 3.5–5.1)
Sodium: 137 mEq/L (ref 135–145)

## 2012-11-07 LAB — HEPATIC FUNCTION PANEL
ALT: 31 U/L (ref 0–53)
AST: 30 U/L (ref 0–37)
Albumin: 4.2 g/dL (ref 3.5–5.2)
Alkaline Phosphatase: 61 U/L (ref 39–117)
Bilirubin, Direct: 0 mg/dL (ref 0.0–0.3)
Total Bilirubin: 0.6 mg/dL (ref 0.3–1.2)
Total Protein: 7.7 g/dL (ref 6.0–8.3)

## 2012-11-07 LAB — LIPID PANEL
Cholesterol: 163 mg/dL (ref 0–200)
HDL: 48.9 mg/dL (ref >39.00)
LDL Cholesterol: 93 mg/dL (ref 0–99)
Total CHOL/HDL Ratio: 3
Triglycerides: 107 mg/dL (ref 0.0–149.0)
VLDL: 21.4 mg/dL (ref 0.0–40.0)

## 2012-11-07 NOTE — Telephone Encounter (Signed)
LMOVM copy completed for [employer] & was faxed. I have mailed pt a copy of this & his lab work [employer] RN

## 2012-11-07 NOTE — Progress Notes (Signed)
Pt comes in today for health [employer] assessment form completion. BP & HR recorded. Fasting labs were also drawn Form will be faxed to [facility] once labs have been reviewed by PA Mylo Red RN]

## 2012-11-19 ENCOUNTER — Other Ambulatory Visit: Payer: 59

## 2012-11-19 ENCOUNTER — Ambulatory Visit: Payer: 59 | Admitting: Vascular Surgery

## 2012-11-29 ENCOUNTER — Encounter: Payer: Self-pay | Admitting: *Deleted

## 2012-12-02 ENCOUNTER — Encounter: Payer: Self-pay | Admitting: Vascular Surgery

## 2012-12-03 ENCOUNTER — Encounter: Payer: Self-pay | Admitting: Vascular Surgery

## 2012-12-03 ENCOUNTER — Ambulatory Visit (HOSPITAL_COMMUNITY)
Admission: RE | Admit: 2012-12-03 | Discharge: 2012-12-03 | Disposition: A | Discharge status: Home / Self Care | Payer: 59 | Source: Ambulatory Visit | Attending: Vascular Surgery | Admitting: Vascular Surgery

## 2012-12-03 ENCOUNTER — Ambulatory Visit (INDEPENDENT_AMBULATORY_CARE_PROVIDER_SITE_OTHER): Payer: 59 | Admitting: Vascular Surgery

## 2012-12-03 ENCOUNTER — Other Ambulatory Visit (HOSPITAL_COMMUNITY): Payer: 59

## 2012-12-03 DIAGNOSIS — Z48812 Encounter for surgical aftercare following surgery on the circulatory system: Secondary | ICD-10-CM

## 2012-12-03 DIAGNOSIS — I6529 Occlusion and stenosis of unspecified carotid artery: Secondary | ICD-10-CM | POA: Insufficient documentation

## 2012-12-03 NOTE — Progress Notes (Signed)
The patient presents today for followup of his left carotid endarterectomy for symptomatic carotid disease. He has done well since his surgery and has had no new neurologic deficits. He does have the usual amount of peri-incisional numbness. He denies any new cardiac difficulties  Past Medical History  Diagnosis Date  . Coronary atherosclerosis of artery bypass graft   . Other and unspecified hyperlipidemia     takes Simvastatin daily  . Gout     takes Allopurinol daily  . Allergy   . CAD (coronary artery disease)   . Peripheral vascular disease   . Malignant hyperthermia     Potential but not Confirmed  . Essential hypertension, benign     Takes Amlodipine and Losartan-HCTZ daily;takes Coreg daily  . Anginal pain     last time a yr ago  . Back pain   . Neck pain     yrs ago and saw a chiropractor occasionally  . GERD (gastroesophageal reflux disease)     was on Prilosec;is not currently on any meds  . Urinary urgency   . Bruises easily     History  Substance Use Topics  . Smoking status: Former Smoker    Quit date: 05/14/1996  . Smokeless tobacco: Never Used     Comment: quit 1995  . Alcohol Use: 3.6 oz/week    6 Cans of beer per week    Family History  Problem Relation Age of Onset  . Kidney failure Mother   . Cancer Mother   . Arthritis Mother   . Kidney disease Mother   . Hypertension Father   . Healthy Father   . CVA Brother 74  . Hypertension Brother     Allergies  Allergen Reactions  . Anesthetics, Amide Anaphylaxis  . Ramipril Cough    Current outpatient prescriptions:allopurinol (ZYLOPRIM) 300 MG tablet, Take 1 tablet (300 mg total) by mouth daily., Disp: 90 tablet, Rfl: 3;  amLODipine (NORVASC) 10 MG tablet, TAKE 1 TABLET DAILY, Disp: 90 tablet, Rfl: 0;  aspirin EC 81 MG tablet, Take 325 mg by mouth daily. , Disp: , Rfl: ;  carvedilol (COREG) 6.25 MG tablet, Take 1 tablet (6.25 mg total) by mouth 2 (two) times daily with a meal., Disp: 180 tablet, Rfl:  3 losartan-hydrochlorothiazide (HYZAAR) 100-12.5 MG per tablet, TAKE 1 TABLET DAILY, Disp: 90 tablet, Rfl: 1;  Multiple Vitamin (MULTI-VITAMIN DAILY PO), Take 1 tablet by mouth daily. , Disp: , Rfl: ;  simvastatin (ZOCOR) 20 MG tablet, TAKE 1 TABLET AT BEDTIME, Disp: 90 tablet, Rfl: 0;  clopidogrel (PLAVIX) 75 MG tablet, Take 75 mg by mouth daily., Disp: , Rfl:  fexofenadine (ALLEGRA) 180 MG tablet, Take 1 tablet (180 mg total) by mouth daily., Disp: 30 tablet, Rfl: 2;  fluticasone (FLONASE) 50 MCG/ACT nasal spray, Place 2 sprays into the nose daily., Disp: 16 g, Rfl: 2;  oxyCODONE (ROXICODONE) 5 MG immediate release tablet, Take 1 tablet (5 mg total) by mouth every 6 (six) hours as needed for pain., Disp: 30 tablet, Rfl: 0  BP 128/71  Pulse 84  Ht 5\' 10"  (1.778 m)  Wt 227 lb 3.2 oz (103.057 kg)  BMI 32.60 kg/m2  SpO2 96%  Body mass index is 32.6 kg/(m^2).       On physical exam well-developed well-nourished white male no acute distress Neurologically he is grossly intact Pulse status 2+ radial pulses bilaterally Left carotid incision is well healed and he has no bruits bilaterally Respirations are equal and nonlabored  Carotid  duplex in our office today revealed widely patent left endarterectomy with no evidence of recurrent stenosis. Right carotid shows mild plaque with no significant narrowing  Impression and plan stable status post left carotid endarterectomy for symptomatic disease. He will continue his usual activities. We will see him again in 6 months with repeat carotid duplex

## 2012-12-03 NOTE — Addendum Note (Signed)
Addended by: Adria Dill L on: 12/03/2012 04:26 PM   Modules accepted: Orders

## 2012-12-06 ENCOUNTER — Ambulatory Visit: Payer: 59 | Admitting: Cardiology

## 2012-12-06 ENCOUNTER — Encounter: Payer: Self-pay | Admitting: Cardiology

## 2012-12-06 ENCOUNTER — Ambulatory Visit (INDEPENDENT_AMBULATORY_CARE_PROVIDER_SITE_OTHER): Payer: 59 | Admitting: Cardiology

## 2012-12-06 VITALS — BP 130/82 | HR 83 | Wt 227.0 lb

## 2012-12-06 DIAGNOSIS — I2510 Atherosclerotic heart disease of native coronary artery without angina pectoris: Secondary | ICD-10-CM

## 2012-12-06 DIAGNOSIS — I10 Essential (primary) hypertension: Secondary | ICD-10-CM

## 2012-12-06 MED ORDER — ALLOPURINOL 300 MG PO TABS: 300.0000 mg | ORAL_TABLET | Freq: Every day | ORAL | Status: DC

## 2012-12-06 MED ORDER — CARVEDILOL 6.25 MG PO TABS: 6.2500 mg | ORAL_TABLET | Freq: Two times a day (BID) | ORAL | Status: DC

## 2012-12-06 MED ORDER — LOSARTAN POTASSIUM-HCTZ 100-12.5 MG PO TABS: ORAL_TABLET | ORAL | Status: DC

## 2012-12-06 MED ORDER — SIMVASTATIN 20 MG PO TABS: ORAL_TABLET | ORAL | Status: DC

## 2012-12-06 MED ORDER — AMLODIPINE BESYLATE 10 MG PO TABS: ORAL_TABLET | ORAL | Status: DC

## 2012-12-06 NOTE — Patient Instructions (Signed)
Your physician has requested that you have an exercise stress myoview. For further information please visit [email or web address] Please follow instruction sheet, as given.  Your physician wants you to follow-up in: 1 year with Dr. Delton See.  You will receive a reminder letter in the mail two months in advance. If you don't receive a letter, please call our office to schedule the follow-up appointment.  Your physician recommends that you return for a FASTING lipid profile AND CMP in one year prior to yearly visit     Your physician recommends that you continue on your current medications as directed. Please refer to the Current Medication list given to you today.

## 2012-12-06 NOTE — Progress Notes (Signed)
Patient ID: RYOTT RAFFERTY, male   DOB: [birth year], 57 y.o.   MRN: [id number]     Patient Name: Michael [name] Date of Encounter: 12/06/2012  Primary Care Provider:  Harlow Asa, MD Primary Cardiologist:  Tobias Alexander, H   Problem List   Past Medical History  Diagnosis Date  . Coronary atherosclerosis of artery bypass graft   . Other and unspecified hyperlipidemia     takes Simvastatin daily  . Gout     takes Allopurinol daily  . Allergy   . CAD (coronary artery disease)   . Peripheral vascular disease   . Malignant hyperthermia     Potential but not Confirmed  . Essential hypertension, benign     Takes Amlodipine and Losartan-HCTZ daily;takes Coreg daily  . Anginal pain     last time a yr ago  . Back pain   . Neck pain     yrs ago and saw a chiropractor occasionally  . GERD (gastroesophageal reflux disease)     was on Prilosec;is not currently on any meds  . Urinary urgency   . Bruises easily    Past Surgical History  Procedure Laterality Date  . Ankle fracture surgery Left 1981  . Colonoscopy    . Inguinal hernia repair  Aug. 2005    bilateral inguinal  . Coronary artery bypass graft  1999    x 4  . Fracture surgery  1980    screws placed and in 1981 screws removed(this is when was told about potential for MH  . Esophagogastroduodenoscopy    . Cystoscopy    . Endarterectomy Left 04/29/2012    Procedure: ENDARTERECTOMY CAROTID;  Surgeon: [name], MD;  Location: [facility] OR;  Service: Vascular;  Laterality: Left;  with primary closure of artery    Allergies  Allergies  Allergen Reactions  . Anesthetics, Amide Anaphylaxis  . Ramipril Cough    HPI  Michael [name] is a former patient of Dr Michael [name]. He has h/o CAD, CABG in 1999, PAD, s/p left carotid endarterectomy in April 2014, hypertension and hyperlipidemia. This is his 1 year follow up. He denies any chest pain and feels SOB on strenuous exercise. He underwent carotid endarterectomy for symptoms of dizziness and  neck tingling that has resolved partially. He still feels dizzy sometimes. He denies palpitations, orthopnea, PND or syncope. He has gained weight, the last year he started to exercise daily, but with symptoms of dizziness and recent surgery he stopped. He is motivated to do so. He states that sometimes he develops feet edema.   Home Medications  Prior to Admission medications   Medication Sig Start Date End Date Taking? Authorizing Provider  allopurinol (ZYLOPRIM) 300 MG tablet Take 1 tablet (300 mg total) by mouth daily. 01/09/12  Yes Gaylord Shih, MD  amLODipine (NORVASC) 10 MG tablet TAKE 1 TABLET DAILY 10/29/12  Yes Lars Masson, MD  aspirin 325 MG tablet Take 325 mg by mouth daily.   Yes Historical Provider, MD  carvedilol (COREG) 6.25 MG tablet Take 1 tablet (6.25 mg total) by mouth 2 (two) times daily with a meal. 02/07/12 02/06/13 Yes Gaylord Shih, MD  losartan-hydrochlorothiazide [facility]) 100-12.5 MG per tablet TAKE 1 TABLET DAILY 09/20/12  Yes Vesta Mixer, MD  Multiple Vitamin (MULTI-VITAMIN DAILY PO) Take 1 tablet by mouth daily.    Yes Historical Provider, MD  simvastatin (ZOCOR) 20 MG tablet TAKE 1 TABLET AT BEDTIME 10/29/12  Yes Lars Masson, MD  Family History  Family History  Problem Relation Age of Onset  . Kidney failure Mother   . Cancer Mother   . Arthritis Mother   . Kidney disease Mother   . Hypertension Father   . Healthy Father   . CVA Brother 81  . Hypertension Brother     Social History  History   Social History  . Marital Status: Divorced    Spouse Name: N/A    Number of Children: N/A  . Years of Education: N/A   Occupational History  . CONSTRUCTION/MAINT     [employer]   Social History Main Topics  . Smoking status: Former Smoker    Quit date: 05/14/1996  . Smokeless tobacco: Never Used     Comment: quit 1995  . Alcohol Use: 3.6 oz/week    6 Cans of beer per week  . Drug Use: No  . Sexual Activity: No   Other  Topics Concern  . Not on file   Social History Narrative  . No narrative on file     Review of Systems, as per HPI, otherwise negative General:  No chills, fever, night sweats or weight changes.  Cardiovascular:  No chest pain, dyspnea on exertion, edema, orthopnea, palpitations, paroxysmal nocturnal dyspnea. Dermatological: No rash, lesions/masses Respiratory: No cough, dyspnea Urologic: No hematuria, dysuria Abdominal:   No nausea, vomiting, diarrhea, bright red blood per rectum, melena, or hematemesis Neurologic:  No visual changes, wkns, changes in mental status. All other systems reviewed and are otherwise negative except as noted above.  Physical Exam  Blood pressure 130/82, pulse 83, weight 227 lb (102.967 kg).  General: Pleasant, NAD Psych: Normal affect. Neuro: Alert and oriented X 3. Moves all extremities spontaneously. HEENT: Normal  Neck: Supple without bruits or JVD. Lungs:  Resp regular and unlabored, CTA. Heart: RRR no s3, s4, or murmurs. Abdomen: Soft, non-tender, non-distended, BS + x 4.  Extremities: No clubbing, cyanosis or edema. DP/PT/Radials 2+ and equal bilaterally.  Labs:  No results found for this basename: CKTOTAL, CKMB, TROPONINI,  in the last 72 hours Lab Results  Component Value Date   WBC 6.7 04/30/2012   HGB 11.6* 04/30/2012   HCT 32.3* 04/30/2012   MCV 89.2 04/30/2012   PLT 140* 04/30/2012   No results found for this basename: NA, K, CL, CO2, BUN, CREATININE, CALCIUM, LABALBU, PROT, BILITOT, ALKPHOS, ALT, AST, GLUCOSE,  in the last 168 hours Lab Results  Component Value Date   CHOL 163 11/07/2012   HDL 48.90 11/07/2012   LDLCALC 93 11/07/2012   TRIG 107.0 11/07/2012   No results found for this basename: DDIMER   No components found with this basename: POCBNP,   Accessory Clinical Findings  Echocardiogram 01/04/2012 Study Conclusions  - Left ventricle: The cavity size was normal. Wall thickness was increased in a pattern of mild LVH.  Systolic function was normal. The estimated ejection fraction was in the range of 60% to 65%. Wall motion was normal; there were no regional wall motion abnormalities. Left ventricular diastolic function parameters were normal.  ECG - normal sinus rhythm, 83 beats per minute, nonspecific T wave abnormality.   Assessment & Plan  A very pleasant 57 year old male   1. CAD, s/p CABG in 1999, mild exertional SOB and some dizziness, no stress test in few years, we will order an exercise nuclear stress test to assess for ischemia and exercise capacity. Echo shows preserved LV EF  2. PAD, s/p carotid endarterectomy, stable on  duplex carotids on 12/03/12  3. HTN - controlled on current regimen, refills given  4. Hyperlipidemia - at goal, repeat in 1 year  Follow up in 1 yer with lipids and CMP prior to the visit    Lars Masson, MD, [facility] 12/06/2012, 8:32 AM

## 2012-12-12 ENCOUNTER — Ambulatory Visit (HOSPITAL_COMMUNITY)
Admission: RE | Admit: 2012-12-12 | Discharge: 2012-12-12 | Disposition: A | Discharge status: Home / Self Care | Payer: 59 | Source: Ambulatory Visit | Attending: Internal Medicine | Admitting: Internal Medicine

## 2012-12-12 DIAGNOSIS — I10 Essential (primary) hypertension: Secondary | ICD-10-CM | POA: Insufficient documentation

## 2012-12-12 DIAGNOSIS — Z87891 Personal history of nicotine dependence: Secondary | ICD-10-CM | POA: Insufficient documentation

## 2012-12-12 DIAGNOSIS — E663 Overweight: Secondary | ICD-10-CM | POA: Insufficient documentation

## 2012-12-12 DIAGNOSIS — R0989 Other specified symptoms and signs involving the circulatory and respiratory systems: Secondary | ICD-10-CM | POA: Insufficient documentation

## 2012-12-12 DIAGNOSIS — R42 Dizziness and giddiness: Secondary | ICD-10-CM | POA: Insufficient documentation

## 2012-12-12 DIAGNOSIS — I2510 Atherosclerotic heart disease of native coronary artery without angina pectoris: Secondary | ICD-10-CM

## 2012-12-12 DIAGNOSIS — R609 Edema, unspecified: Secondary | ICD-10-CM | POA: Insufficient documentation

## 2012-12-12 DIAGNOSIS — I6529 Occlusion and stenosis of unspecified carotid artery: Secondary | ICD-10-CM | POA: Insufficient documentation

## 2012-12-12 DIAGNOSIS — R0609 Other forms of dyspnea: Secondary | ICD-10-CM | POA: Insufficient documentation

## 2012-12-12 MED ORDER — TECHNETIUM TC 99M SESTAMIBI GENERIC - CARDIOLITE
10.0000 | Freq: Once | INTRAVENOUS | Status: AC | PRN
  Administered 2012-12-12: 10 via INTRAVENOUS

## 2012-12-12 MED ORDER — TECHNETIUM TC 99M SESTAMIBI GENERIC - CARDIOLITE
30.0000 | Freq: Once | INTRAVENOUS | Status: AC | PRN
  Administered 2012-12-12: 30 via INTRAVENOUS

## 2012-12-12 NOTE — Procedures (Addendum)
[facility] [address] [place] [address] [phone]  Cardiology Nuclear Med Study  Michael [name] is a 57 y.o. male     MRN : [id number]     DOB: 11/23/1955  Procedure Date: 12/12/2012  Nuclear Med Background Indication for Stress Test:  Graft Patency History:  CAD;CABG X4--1999 Cardiac Risk Factors: Carotid Disease, History of Smoking, Hypertension, Lipids, Overweight and BIL PEDAL EDEMA  Symptoms:  Dizziness, DOE and Light-Headedness   Nuclear Pre-Procedure Caffeine/Decaff Intake:  7:00pm NPO After: 5:00am   IV Site: R Hand  IV 0.9% NS with Angio Cath:  22g  Chest Size (in):  44"  IV Started by: Emmit Pomfret, RN  Height: 5\' 10"  (1.778 m)  Cup Size: n/a  BMI:  Body mass index is 32.57 kg/(m^2). Weight:  227 lb (102.967 kg)   Tech Comments:  N/A    Nuclear Med Study 1 or 2 day study: 1 day  Stress Test Type:  Stress  Order Authorizing Provider:  Andreas Ohm    Resting Radionuclide: Technetium 67m Sestamibi  Resting Radionuclide Dose: 10.2 mCi   Stress Radionuclide:  Technetium 52m Sestamibi  Stress Radionuclide Dose: 29.1 mCi           Stress Protocol Rest HR: 89 Stress HR: 153  Rest BP: 157/85 Stress BP: 186/117  Exercise Time (min): 8:59 METS: 10.2   Predicted Max HR: 162 bpm % Max HR: 94.44 bpm Rate Pressure Product: 78469  Dose of Adenosine (mg):  n/a Dose of Lexiscan: n/a mg  Dose of Atropine (mg): n/a Dose of Dobutamine: n/a mcg/kg/min (at max HR)  Stress Test Technologist: Esperanza Sheets, CCT Nuclear Technologist: Koren Shiver, CNMT   Rest Procedure:  Myocardial perfusion imaging was performed at rest 45 minutes following the intravenous administration of Technetium 22m Sestamibi. Stress Procedure:  The patient performed treadmill exercise using a Bruce  Protocol for 8:59 minutes. The patient stopped due to Fatigue and SOB and denied any chest pain.  There were significant ST-T wave  changes.  Technetium 19m Sestamibi was injected at peak exercise and myocardial perfusion imaging was performed after a brief delay.  Transient Ischemic Dilatation (Normal <1.22):  0.75 Lung/Heart Ratio (Normal <0.45):  0.26 QGS EDV:  61 ml QGS ESV:  12 ml LV Ejection Fraction: 80%  Signed by Koren Shiver, CNMT  PHYSICIAN INTERPRETATION  Rest ECG: NSR with non-specific ST-T wave changes and NSR-LVH  Stress ECG: Significant ST abnormalities consistent with ischemia. and ~68mm ST elevation in aVR, 0.5 mm ST in aVL; ~1.5 mm horizontal ST depression in II, III, aVF wtih more pronounced TWI in V5 & V6   QPS Raw Data Images:  There is significant tracer uptake in the liver & surrounding splanchnic viscera that partially obscures the Inferior/ inferoseptal wall. Stress Images:  There is decreased uptake in the inferior / Inferoseptal wall. There is a moderate sized, mild intensity mostly fixed perfusion defect in the mid to apical inferior & inferoseptal wall.   Rest Images:  There is decreased uptake in the inferior wall.  There is decreased uptake in the inferoseptum.   Subtraction (SDS):  There is a fixed inferior defect that is most consistent with diaphragmatic attenuation.  Gut attenuation obscures this region.  No reversibility is appreciated. No evidence of ischemia.  There is a mostly fixed defect with no associated wall motion abnormality.  Impression Exercise Capacity:  Good exercise capacity. BP Response: Baseline HTN with  Hypertensive blood  pressure response. DBP increased from 85 - 117 mmHg; SBP 157 - 193 mmHg Clinical Symptoms:  The exercise was limited by dyspnea & fatigue.  No anginal pain.. ECG Impression:  Significant ST abnormalities consistent with ischemia. Comparison with Prior Nuclear Study: No images to compare LV Wall Motion:  NL LV Function; NL Wall Motion  Overall Impression: Low to  Intermediate risk stress nuclear study Abnormal ECG portion of the Exercise  Myoview with notable ST depression concerning for ischemia along with PVCs in singlets & couplet that were more prominent .  There is no Scintigraphic finding to suggest ischemia, but the presence of significant splanchnic attenuation makes the study less interpretable.     [name], MD  12/12/2012 1:07 PM

## 2012-12-20 ENCOUNTER — Other Ambulatory Visit: Payer: Self-pay

## 2012-12-20 MED ORDER — CARVEDILOL 12.5 MG PO TABS: 12.5000 mg | ORAL_TABLET | Freq: Two times a day (BID) | ORAL | Status: DC

## 2013-01-02 HISTORY — PX: CARDIAC CATHETERIZATION: SHX172

## 2013-01-23 ENCOUNTER — Encounter: Payer: Self-pay | Admitting: Cardiology

## 2013-01-24 ENCOUNTER — Other Ambulatory Visit (INDEPENDENT_AMBULATORY_CARE_PROVIDER_SITE_OTHER): Payer: 59

## 2013-01-24 DIAGNOSIS — I10 Essential (primary) hypertension: Secondary | ICD-10-CM

## 2013-01-24 DIAGNOSIS — I2510 Atherosclerotic heart disease of native coronary artery without angina pectoris: Secondary | ICD-10-CM

## 2013-01-24 LAB — COMPREHENSIVE METABOLIC PANEL
ALT: 32 U/L (ref 0–53)
AST: 30 U/L (ref 0–37)
Albumin: 4 g/dL (ref 3.5–5.2)
Alkaline Phosphatase: 58 U/L (ref 39–117)
BUN: 16 mg/dL (ref 6–23)
CO2: 31 mEq/L (ref 19–32)
Calcium: 9.3 mg/dL (ref 8.4–10.5)
Chloride: 101 mEq/L (ref 96–112)
Creatinine, Ser: 1 mg/dL (ref 0.4–1.5)
GFR: 77.86 mL/min (ref >60.00)
Glucose, Bld: 106 mg/dL — ABNORMAL HIGH (ref 70–99)
Potassium: 3.9 mEq/L (ref 3.5–5.1)
Sodium: 139 mEq/L (ref 135–145)
Total Bilirubin: 0.5 mg/dL (ref 0.3–1.2)
Total Protein: 7.4 g/dL (ref 6.0–8.3)

## 2013-01-24 LAB — LIPID PANEL
Cholesterol: 161 mg/dL (ref 0–200)
HDL: 45.1 mg/dL (ref >39.00)
LDL Cholesterol: 86 mg/dL (ref 0–99)
Total CHOL/HDL Ratio: 4
Triglycerides: 148 mg/dL (ref 0.0–149.0)
VLDL: 29.6 mg/dL (ref 0.0–40.0)

## 2013-01-31 ENCOUNTER — Ambulatory Visit (INDEPENDENT_AMBULATORY_CARE_PROVIDER_SITE_OTHER): Payer: 59 | Admitting: Cardiology

## 2013-01-31 ENCOUNTER — Encounter: Payer: Self-pay | Admitting: Cardiology

## 2013-01-31 VITALS — BP 148/71 | HR 89 | Ht 70.0 in | Wt 231.0 lb

## 2013-01-31 DIAGNOSIS — Z951 Presence of aortocoronary bypass graft: Secondary | ICD-10-CM

## 2013-01-31 DIAGNOSIS — I2510 Atherosclerotic heart disease of native coronary artery without angina pectoris: Secondary | ICD-10-CM

## 2013-01-31 DIAGNOSIS — E785 Hyperlipidemia, unspecified: Secondary | ICD-10-CM

## 2013-01-31 DIAGNOSIS — I6529 Occlusion and stenosis of unspecified carotid artery: Secondary | ICD-10-CM

## 2013-01-31 DIAGNOSIS — E669 Obesity, unspecified: Secondary | ICD-10-CM

## 2013-01-31 DIAGNOSIS — I10 Essential (primary) hypertension: Secondary | ICD-10-CM

## 2013-01-31 NOTE — Progress Notes (Signed)
Patient ID: SLAYTER MOORHOUSE, male   DOB: 07-14-1955, 58 y.o.   MRN: [id number]    Patient Name: Michael [name] Date of Encounter: 01/31/2013  Primary Care Provider:  Rubbie Battiest, MD Primary Cardiologist:  Ena Dawley, H   Problem List   Past Medical History  Diagnosis Date  . Coronary atherosclerosis of artery bypass graft   . Other and unspecified hyperlipidemia     takes Simvastatin daily  . Gout     takes Allopurinol daily  . Allergy   . CAD (coronary artery disease)   . Peripheral vascular disease   . Malignant hyperthermia     Potential but not Confirmed  . Essential hypertension, benign     Takes Amlodipine and Losartan-HCTZ daily;takes Coreg daily  . Anginal pain     last time a yr ago  . Back pain   . Neck pain     yrs ago and saw a chiropractor occasionally  . GERD (gastroesophageal reflux disease)     was on Prilosec;is not currently on any meds  . Urinary urgency   . Bruises easily    Past Surgical History  Procedure Laterality Date  . Ankle fracture surgery Left 1981  . Colonoscopy    . Inguinal hernia repair  Aug. 2005    bilateral inguinal  . Coronary artery bypass graft  1999    x 4  . Fracture surgery  1980    screws placed and in 1981 screws removed(this is when was told about potential for MH  . Esophagogastroduodenoscopy    . Cystoscopy    . Endarterectomy Left 04/29/2012    Procedure: ENDARTERECTOMY CAROTID;  Surgeon: Rosetta Posner, MD;  Location: [facility] OR;  Service: Vascular;  Laterality: Left;  with primary closure of artery    Allergies  Allergies  Allergen Reactions  . Anesthetics, Amide Anaphylaxis  . Ramipril Cough    HPI  Michael [name] is a former patient of Dr Verl Blalock. He has h/o CAD, CABG in 1999, PAD, s/p left carotid endarterectomy in April 2014, hypertension and hyperlipidemia. This is his 1 year follow up. He denies any chest pain and feels SOB on strenuous exercise. He underwent carotid endarterectomy for symptoms of dizziness and neck  tingling that has resolved partially. He still feels dizzy sometimes. He denies palpitations, orthopnea, PND or syncope. He has gained weight, the last year he started to exercise daily, but with symptoms of dizziness and recent surgery he stopped. He is motivated to do so. He states that sometimes he develops feet edema.  One month follow up, denies chest pain, SOB, LE edema, palpitations or syncope.  Home Medications  Prior to Admission medications   Medication Sig Start Date End Date Taking? Authorizing Provider  allopurinol (ZYLOPRIM) 300 MG tablet Take 1 tablet (300 mg total) by mouth daily. 01/09/12  Yes Renella Cunas, MD  amLODipine (NORVASC) 10 MG tablet TAKE 1 TABLET DAILY 10/29/12  Yes Dorothy Spark, MD  aspirin 325 MG tablet Take 325 mg by mouth daily.   Yes Historical Provider, MD  carvedilol (COREG) 6.25 MG tablet Take 1 tablet (6.25 mg total) by mouth 2 (two) times daily with a meal. 02/07/12 02/06/13 Yes Renella Cunas, MD  losartan-hydrochlorothiazide [facility]) 100-12.5 MG per tablet TAKE 1 TABLET DAILY 09/20/12  Yes Thayer Headings, MD  Multiple Vitamin (MULTI-VITAMIN DAILY PO) Take 1 tablet by mouth daily.    Yes Historical Provider, MD  simvastatin (ZOCOR) 20 MG tablet TAKE 1  TABLET AT BEDTIME 10/29/12  Yes Dorothy Spark, MD    Family History  Family History  Problem Relation Age of Onset  . Kidney failure Mother   . Cancer Mother   . Arthritis Mother   . Kidney disease Mother   . Hypertension Father   . Healthy Father   . CVA Brother 33  . Hypertension Brother     Social History  History   Social History  . Marital Status: Divorced    Spouse Name: N/A    Number of Children: N/A  . Years of Education: N/A   Occupational History  . CONSTRUCTION/MAINT     [employer]   Social History Main Topics  . Smoking status: Former Smoker    Quit date: 05/14/1996  . Smokeless tobacco: Never Used     Comment: quit 1995  . Alcohol Use: 3.6 oz/week    6  Cans of beer per week  . Drug Use: No  . Sexual Activity: No   Other Topics Concern  . Not on file   Social History Narrative  . No narrative on file     Review of Systems, as per HPI, otherwise negative General:  No chills, fever, night sweats or weight changes.  Cardiovascular:  No chest pain, dyspnea on exertion, edema, orthopnea, palpitations, paroxysmal nocturnal dyspnea. Dermatological: No rash, lesions/masses Respiratory: No cough, dyspnea Urologic: No hematuria, dysuria Abdominal:   No nausea, vomiting, diarrhea, bright red blood per rectum, melena, or hematemesis Neurologic:  No visual changes, wkns, changes in mental status. All other systems reviewed and are otherwise negative except as noted above.  Physical Exam  Blood pressure 148/71, pulse 89, height 5\' 10"  (1.778 m), weight 231 lb (104.781 kg).  General: Pleasant, NAD Psych: Normal affect. Neuro: Alert and oriented X 3. Moves all extremities spontaneously. HEENT: Normal  Neck: Supple without bruits or JVD. Lungs:  Resp regular and unlabored, CTA. Heart: RRR no s3, s4, or murmurs. Abdomen: Soft, non-tender, non-distended, BS + x 4.  Extremities: No clubbing, cyanosis or edema. DP/PT/Radials 2+ and equal bilaterally.  Labs:  No results found for this basename: CKTOTAL, CKMB, TROPONINI,  in the last 72 hours Lab Results  Component Value Date   WBC 6.7 04/30/2012   HGB 11.6* 04/30/2012   HCT 32.3* 04/30/2012   MCV 89.2 04/30/2012   PLT 140* 04/30/2012   No results found for this basename: NA, K, CL, CO2, BUN, CREATININE, CALCIUM, LABALBU, PROT, BILITOT, ALKPHOS, ALT, AST, GLUCOSE,  in the last 168 hours Lab Results  Component Value Date   CHOL 161 01/24/2013   HDL 45.10 01/24/2013   LDLCALC 86 01/24/2013   TRIG 148.0 01/24/2013   No results found for this basename: DDIMER   No components found with this basename: POCBNP,   Accessory Clinical Findings  Echocardiogram 01/04/2012 Study Conclusions  - Left  ventricle: The cavity size was normal. Wall thickness was increased in a pattern of mild LVH. Systolic function was normal. The estimated ejection fraction was in the range of 60% to 65%. Wall motion was normal; there were no regional wall motion abnormalities. Left ventricular diastolic function parameters were normal.  ECG - normal sinus rhythm, 83 beats per minute, nonspecific T wave abnormality.  Exercise nuclear stress test 12/12/2013 Exercise Capacity: Good exercise capacity. BP Response: Baseline HTN with Hypertensive blood pressure  response. DBP increased from 85 - 117 mmHg; SBP 157 - 193 mmHg Clinical Symptoms: The exercise was limited by dyspnea &  fatigue. No anginal pain.. ECG Impression: Significant ST abnormalities consistent with  ischemia. Comparison with Prior Nuclear Study: No images to compare LV Wall Motion: NL LV Function; NL Wall Motion  Overall Impression: Low to Intermediate risk stress nuclear  study Abnormal ECG portion of the Exercise Myoview with notable  ST depression concerning for ischemia along with PVCs in singlets & couplet that were more prominent . There is no Scintigraphic  finding to suggest ischemia, but the presence of significant  splanchnic attenuation makes the study less interpretable.    Assessment & Plan  A very pleasant 58 year old male   1. CAD, s/p CABG in 1999, mild exertional SOB and some dizziness, an exercise nuclear stress test showed to assess for ischemia and exercise capacity. Echo shows good exercise capacity, but hypertensive response response with dyspnea. Imaging didn't show any ischemia. The patient is overweight and not physically active, he is consulted on proper exercise and frequency.   2. PAD, s/p carotid endarterectomy, stable on duplex carotids on 12/03/12  3. HTN - controlled on current regimen, refills given  4. Hyperlipidemia - at goal, repeat in 1 year  Follow up in 1 yer with lipids and CMP prior to the  visit    Dorothy Spark, MD, [facility] 01/31/2013, 3:44 PM

## 2013-01-31 NOTE — Patient Instructions (Signed)
Your physician wants you to follow-up in: 1 year with Dr. Johann Capers will receive a reminder letter in the mail two months in advance. If you don't receive a letter, please call our office to schedule the follow-up appointment.   Your physician recommends that you continue on your current medications as directed. Please refer to the Current Medication list given to you today.

## 2013-06-02 ENCOUNTER — Encounter: Payer: Self-pay | Admitting: Family

## 2013-06-03 ENCOUNTER — Ambulatory Visit (INDEPENDENT_AMBULATORY_CARE_PROVIDER_SITE_OTHER): Payer: 59 | Admitting: Family

## 2013-06-03 ENCOUNTER — Ambulatory Visit (HOSPITAL_COMMUNITY)
Admission: RE | Admit: 2013-06-03 | Discharge: 2013-06-03 | Disposition: A | Discharge status: Home / Self Care | Payer: 59 | Source: Ambulatory Visit | Attending: Family | Admitting: Family

## 2013-06-03 ENCOUNTER — Other Ambulatory Visit: Payer: Self-pay | Admitting: Vascular Surgery

## 2013-06-03 ENCOUNTER — Encounter: Payer: Self-pay | Admitting: Family

## 2013-06-03 VITALS — BP 129/83 | HR 71 | Resp 16 | Ht 70.0 in | Wt 229.0 lb

## 2013-06-03 DIAGNOSIS — Z9889 Other specified postprocedural states: Secondary | ICD-10-CM | POA: Insufficient documentation

## 2013-06-03 DIAGNOSIS — I6529 Occlusion and stenosis of unspecified carotid artery: Secondary | ICD-10-CM

## 2013-06-03 DIAGNOSIS — Z48812 Encounter for surgical aftercare following surgery on the circulatory system: Secondary | ICD-10-CM

## 2013-06-03 DIAGNOSIS — I658 Occlusion and stenosis of other precerebral arteries: Secondary | ICD-10-CM | POA: Insufficient documentation

## 2013-06-03 NOTE — Patient Instructions (Signed)

## 2013-06-03 NOTE — Progress Notes (Signed)
Established Carotid Patient   History of Present Illness  Michael [name] is a 58 y.o. male patient of Dr. Donnetta Hutching who is s/p left CEA on 04/29/12. The patient presents today for followup of his left carotid endarterectomy for symptomatic carotid disease. He has done well since his surgery and has had no new neurologic deficits. He does have the usual amount of peri-incisional numbness.  He has an appointment with his cardiologist in 3 days. He has no history of stroke or TIA. Pt denies claudication symptoms with walking, denies tingling, numbness, or pain in either arm.   Pt denies New Medical or Surgical History.  Pt Diabetic: No Pt smoker: former smoker, quit in 1995  Pt meds include: Statin : Yes ASA: Yes Other anticoagulants/antiplatelets: no   Past Medical History  Diagnosis Date  . Coronary atherosclerosis of artery bypass graft   . Other and unspecified hyperlipidemia     takes Simvastatin daily  . Gout     takes Allopurinol daily  . Allergy   . CAD (coronary artery disease)   . Peripheral vascular disease   . Malignant hyperthermia     Potential but not Confirmed  . Essential hypertension, benign     Takes Amlodipine and Losartan-HCTZ daily;takes Coreg daily  . Anginal pain     last time a yr ago  . Back pain   . Neck pain     yrs ago and saw a chiropractor occasionally  . GERD (gastroesophageal reflux disease)     was on Prilosec;is not currently on any meds  . Urinary urgency   . Bruises easily     Social History History  Substance Use Topics  . Smoking status: Former Smoker    Quit date: 05/14/1996  . Smokeless tobacco: Never Used     Comment: quit 1995  . Alcohol Use: 3.6 oz/week    6 Cans of beer per week    Family History Family History  Problem Relation Age of Onset  . Kidney failure Mother   . Cancer Mother   . Arthritis Mother   . Kidney disease Mother   . Hypertension Father   . Healthy Father   . CVA Brother 69  . Hypertension Brother      Surgical History Past Surgical History  Procedure Laterality Date  . Ankle fracture surgery Left 1981  . Colonoscopy    . Inguinal hernia repair  Aug. 2005    bilateral inguinal  . Coronary artery bypass graft  1999    x 4  . Fracture surgery  1980    screws placed and in 1981 screws removed(this is when was told about potential for MH  . Esophagogastroduodenoscopy    . Cystoscopy    . Endarterectomy Left 04/29/2012    Procedure: ENDARTERECTOMY CAROTID;  Surgeon: Rosetta Posner, MD;  Location: [place];  Service: Vascular;  Laterality: Left;  with primary closure of artery  . Carotid endarterectomy Left 04-29-12    cea    Allergies  Allergen Reactions  . Anesthetics, Amide Anaphylaxis  . Ramipril Cough    Current Outpatient Prescriptions  Medication Sig Dispense Refill  . allopurinol (ZYLOPRIM) 300 MG tablet Take 1 tablet (300 mg total) by mouth daily.  90 tablet  3  . amLODipine (NORVASC) 10 MG tablet TAKE 1 TABLET DAILY  90 tablet  2  . aspirin 325 MG tablet Take 325 mg by mouth daily.      . carvedilol (COREG) 12.5 MG tablet Take 1  tablet (12.5 mg total) by mouth 2 (two) times daily.  180 tablet  3  . losartan-hydrochlorothiazide (HYZAAR) 100-12.5 MG per tablet TAKE 1 TABLET DAILY  90 tablet  1  . Multiple Vitamin (MULTI-VITAMIN DAILY PO) Take 1 tablet by mouth daily.       . simvastatin (ZOCOR) 20 MG tablet TAKE 1 TABLET AT BEDTIME  90 tablet  2   No current facility-administered medications for this visit.    Review of Systems : See HPI for pertinent positives and negatives.  Physical Examination  Filed Vitals:   06/03/13 1224  BP: 129/83  Pulse: 71  Resp: 16   Filed Vitals:   06/03/13 1221 06/03/13 1224  BP: 119/79 129/83  Pulse: 71 71  Resp:  16  Height:  5\' 10"  (1.778 m)  Weight:  229 lb (103.874 kg)  SpO2:  96%   Body mass index is 32.86 kg/(m^2).   General: WDWN male in NAD GAIT: normal Eyes: PERRLA Pulmonary:  Non-labored, CTAB, Negative   Rales, Negative rhonchi, & Negative wheezing.  Cardiac: regular Rhythm ,  Negative detected murmur.  VASCULAR EXAM Carotid Bruits Left Right   Negative Negative    Radial pulses are 2+ palpable and equal.                                                                                                                        Gastrointestinal: soft, nontender, BS WNL, no r/g,  negative masses.  Musculoskeletal: Negative muscle atrophy/wasting. M/S 5/5 throughout, Extremities without ischemic changes.  Neurologic: A&O X 3; Appropriate Affect ; SENSATION ;normal;  Speech is normal CN 2-12 intact, Pain and light touch intact in extremities, Motor exam as listed above.   Non-Invasive Vascular Imaging CAROTID DUPLEX 06/03/2013   CEREBROVASCULAR DUPLEX EVALUATION    INDICATION: Carotid stenosis    PREVIOUS INTERVENTION(S): Left carotid endarterectomy 04/29/2012    DUPLEX EXAM:     RIGHT  LEFT  Peak Systolic Velocities (cm/s) End Diastolic Velocities (cm/s) Plaque LOCATION Peak Systolic Velocities (cm/s) End Diastolic Velocities (cm/s) Plaque  145 18 HT CCA PROXIMAL 140 25   126 32  CCA MID 130 33   112 33 HT CCA DISTAL 106 31   126 16  ECA 143 21   119 40 HT ICA PROXIMAL 97 40   97 30  ICA MID 98 40   76 24  ICA DISTAL 73 30     0.94 ICA / CCA Ratio (PSV) carotid endarterectomy  Antegrade Vertebral Flow Antegrade  267 Brachial Systolic Pressure (mmHg) 124  Triphasic Brachial Artery Waveforms Triphasic    Plaque Morphology:  HM = Homogeneous, HT = Heterogeneous, CP = Calcific Plaque, SP = Smooth Plaque, IP = Irregular Plaque     ADDITIONAL FINDINGS:     IMPRESSION: Right internal carotid artery stenosis present in the 40%-59% range. Left internal carotid artery is patent with history of carotid endarterectomy, elevated end diastolic velocities present however no plaque is visualized.  Compared to the previous exam:  Increased on the right and stable on the left since study  on 12/03/2012.    Assessment: Michael [name] is a 58 y.o. male who  s/p left CEA on 04/29/12 with no history of stroke or TIA. Right internal carotid artery stenosis present in the 40%-59% range. Left internal carotid artery is patent with history of carotid endarterectomy, elevated end diastolic velocities present however no plaque is visualized. Increased on the right and stable on the left since study on 12/03/2012.  Consider intensive statin therapy which is associated with a greater reduction in CVD risk and improved endothelial function , will defer to PCP.   Plan: Follow-up in 1 year with Carotid Duplex scan.   I discussed in depth with the patient the nature of atherosclerosis, and emphasized the importance of maximal medical management including strict control of blood pressure, blood glucose, and lipid levels, obtaining regular exercise, and continued cessation of smoking.  The patient is aware that without maximal medical management the underlying atherosclerotic disease process will progress, limiting the benefit of any interventions. The patient was given information about stroke prevention and what symptoms should prompt the patient to seek immediate medical care. Thank you for allowing [place] to participate in this patient's care.  Clemon Chambers, RN, MSN, FNP-C [facility] Office: [phone]  Clinic Physician: Kellie Simmering  06/03/2013 12:45 PM

## 2013-06-06 ENCOUNTER — Encounter: Payer: Self-pay | Admitting: Cardiology

## 2013-06-06 ENCOUNTER — Ambulatory Visit (INDEPENDENT_AMBULATORY_CARE_PROVIDER_SITE_OTHER): Payer: 59 | Admitting: Cardiology

## 2013-06-06 ENCOUNTER — Encounter: Payer: Self-pay | Admitting: *Deleted

## 2013-06-06 VITALS — BP 128/72 | HR 77 | Ht 70.0 in | Wt 229.0 lb

## 2013-06-06 DIAGNOSIS — I2510 Atherosclerotic heart disease of native coronary artery without angina pectoris: Secondary | ICD-10-CM

## 2013-06-06 DIAGNOSIS — Z48812 Encounter for surgical aftercare following surgery on the circulatory system: Secondary | ICD-10-CM

## 2013-06-06 DIAGNOSIS — E785 Hyperlipidemia, unspecified: Secondary | ICD-10-CM

## 2013-06-06 DIAGNOSIS — I10 Essential (primary) hypertension: Secondary | ICD-10-CM

## 2013-06-06 DIAGNOSIS — I6529 Occlusion and stenosis of unspecified carotid artery: Secondary | ICD-10-CM

## 2013-06-06 LAB — CBC WITH DIFFERENTIAL/PLATELET
Basophils Absolute: 0 10*3/uL (ref 0.0–0.1)
Basophils Relative: 0.1 % (ref 0.0–3.0)
Eosinophils Absolute: 0.3 10*3/uL (ref 0.0–0.7)
Eosinophils Relative: 4 % (ref 0.0–5.0)
HCT: 39.3 % (ref 39.0–52.0)
Hemoglobin: 13.4 g/dL (ref 13.0–17.0)
Lymphocytes Relative: 20.5 % (ref 12.0–46.0)
Lymphs Abs: 1.3 10*3/uL (ref 0.7–4.0)
MCHC: 34 g/dL (ref 30.0–36.0)
MCV: 92.8 fl (ref 78.0–100.0)
Monocytes Absolute: 0.8 10*3/uL (ref 0.1–1.0)
Monocytes Relative: 12.6 % — ABNORMAL HIGH (ref 3.0–12.0)
Neutro Abs: 4.1 10*3/uL (ref 1.4–7.7)
Neutrophils Relative %: 62.8 % (ref 43.0–77.0)
Platelets: 225 10*3/uL (ref 150.0–400.0)
RBC: 4.23 Mil/uL (ref 4.22–5.81)
RDW: 13.2 % (ref 11.5–15.5)
WBC: 6.6 10*3/uL (ref 4.0–10.5)

## 2013-06-06 LAB — BASIC METABOLIC PANEL
BUN: 17 mg/dL (ref 6–23)
CO2: 31 mEq/L (ref 19–32)
Calcium: 9.4 mg/dL (ref 8.4–10.5)
Chloride: 98 mEq/L (ref 96–112)
Creatinine, Ser: 1.1 mg/dL (ref 0.4–1.5)
GFR: 75.25 mL/min (ref >60.00)
Glucose, Bld: 91 mg/dL (ref 70–99)
Potassium: 3.8 mEq/L (ref 3.5–5.1)
Sodium: 138 mEq/L (ref 135–145)

## 2013-06-06 LAB — PROTIME-INR
INR: 1 ratio (ref 0.8–1.0)
Prothrombin Time: 11.2 s (ref 9.6–13.1)

## 2013-06-06 NOTE — Progress Notes (Signed)
Patient ID: Michael [name], male   DOB: January 11, 1955, 58 y.o.   MRN: [id number]    Patient Name: Michael [name] Date of Encounter: 06/06/2013  Primary Care Provider:  Rubbie Battiest, MD Primary Cardiologist:  Dorothy Spark   Problem List   Past Medical History  Diagnosis Date  . Coronary atherosclerosis of artery bypass graft   . Other and unspecified hyperlipidemia     takes Simvastatin daily  . Gout     takes Allopurinol daily  . Allergy   . CAD (coronary artery disease)   . Peripheral vascular disease   . Malignant hyperthermia     Potential but not Confirmed  . Essential hypertension, benign     Takes Amlodipine and Losartan-HCTZ daily;takes Coreg daily  . Anginal pain     last time a yr ago  . Back pain   . Neck pain     yrs ago and saw a chiropractor occasionally  . GERD (gastroesophageal reflux disease)     was on Prilosec;is not currently on any meds  . Urinary urgency   . Bruises easily    Past Surgical History  Procedure Laterality Date  . Ankle fracture surgery Left 1981  . Colonoscopy    . Inguinal hernia repair  Aug. 2005    bilateral inguinal  . Coronary artery bypass graft  1999    x 4  . Fracture surgery  1980    screws placed and in 1981 screws removed(this is when was told about potential for MH  . Esophagogastroduodenoscopy    . Cystoscopy    . Endarterectomy Left 04/29/2012    Procedure: ENDARTERECTOMY CAROTID;  Surgeon: Rosetta Posner, MD;  Location: [place];  Service: Vascular;  Laterality: Left;  with primary closure of artery  . Carotid endarterectomy Left 04-29-12    cea    Allergies  Allergies  Allergen Reactions  . Anesthetics, Amide Anaphylaxis  . Ramipril Cough    HPI  Mr [name] is a former patient of Dr Verl Blalock. He has h/o CAD, CABG in 1999, PAD, s/p left carotid endarterectomy in April 2014, hypertension and hyperlipidemia. This is his 1 year follow up. He denies any chest pain and feels SOB on strenuous exercise. He underwent carotid  endarterectomy for symptoms of dizziness and neck tingling that has resolved partially. He still feels dizzy sometimes. He denies palpitations, orthopnea, PND or syncope. He has gained weight, the last year he started to exercise daily, but with symptoms of dizziness and recent surgery he stopped. He is motivated to do so. He states that sometimes he develops feet edema.  The patient was scheduled for 1 year follow up but is coming after 6 months and is reporting dizziness especially after he stands up but can occur while walking for a while as well. No syncope. He is also experiencing retrosternal tightness that is not exertional, the last one a week ago that persisted for hours. No obvious exacerbating or alleviating factors. No palpitations, no LE edema, orthopnea or PND.   Home Medications  Prior to Admission medications   Medication Sig Start Date End Date Taking? Authorizing Provider  allopurinol (ZYLOPRIM) 300 MG tablet Take 1 tablet (300 mg total) by mouth daily. 01/09/12  Yes Renella Cunas, MD  amLODipine (NORVASC) 10 MG tablet TAKE 1 TABLET DAILY 10/29/12  Yes Dorothy Spark, MD  aspirin 325 MG tablet Take 325 mg by mouth daily.   Yes Historical Provider, MD  carvedilol (COREG) 6.25 MG  tablet Take 1 tablet (6.25 mg total) by mouth 2 (two) times daily with a meal. 02/07/12 02/06/13 Yes Renella Cunas, MD  losartan-hydrochlorothiazide [facility]) 100-12.5 MG per tablet TAKE 1 TABLET DAILY 09/20/12  Yes Thayer Headings, MD  Multiple Vitamin (MULTI-VITAMIN DAILY PO) Take 1 tablet by mouth daily.    Yes Historical Provider, MD  simvastatin (ZOCOR) 20 MG tablet TAKE 1 TABLET AT BEDTIME 10/29/12  Yes Dorothy Spark, MD    Family History  Family History  Problem Relation Age of Onset  . Kidney failure Mother   . Cancer Mother   . Arthritis Mother   . Kidney disease Mother   . Hypertension Father   . Healthy Father   . CVA Brother 25  . Hypertension Brother     Social History  History     Social History  . Marital Status: Divorced    Spouse Name: N/A    Number of Children: N/A  . Years of Education: N/A   Occupational History  . CONSTRUCTION/MAINT     [employer]   Social History Main Topics  . Smoking status: Former Smoker    Quit date: 05/14/1996  . Smokeless tobacco: Never Used     Comment: quit 1995  . Alcohol Use: 3.6 oz/week    6 Cans of beer per week  . Drug Use: No  . Sexual Activity: No   Other Topics Concern  . Not on file   Social History Narrative  . No narrative on file    Review of Systems, as per HPI, otherwise negative General:  No chills, fever, night sweats or weight changes.  Cardiovascular:  No chest pain, dyspnea on exertion, edema, orthopnea, palpitations, paroxysmal nocturnal dyspnea. Dermatological: No rash, lesions/masses Respiratory: No cough, dyspnea Urologic: No hematuria, dysuria Abdominal:   No nausea, vomiting, diarrhea, bright red blood per rectum, melena, or hematemesis Neurologic:  No visual changes, wkns, changes in mental status. All other systems reviewed and are otherwise negative except as noted above.  Physical Exam  Blood pressure 128/72, pulse 77, height 5\' 10"  (1.696 m), weight 229 lb (103.874 kg).  Orthostatics negative. General: Pleasant, NAD Psych: Normal affect. Neuro: Alert and oriented X 3. Moves all extremities spontaneously. HEENT: Normal  Neck: Supple without bruits or JVD. Lungs:  Resp regular and unlabored, CTA. Heart: RRR no s3, s4, or murmurs. Abdomen: Soft, non-tender, non-distended, BS + x 4.  Extremities: No clubbing, cyanosis or edema. DP/PT/Radials 2+ and equal bilaterally.  Labs:  No results found for this basename: CKTOTAL, CKMB, TROPONINI,  in the last 72 hours Lab Results  Component Value Date   WBC 6.7 04/30/2012   HGB 11.6* 04/30/2012   HCT 32.3* 04/30/2012   MCV 89.2 04/30/2012   PLT 140* 04/30/2012   No results found for this basename: NA, K, CL, CO2, BUN, CREATININE,  CALCIUM, LABALBU, PROT, BILITOT, ALKPHOS, ALT, AST, GLUCOSE,  in the last 168 hours Lab Results  Component Value Date   CHOL 161 01/24/2013   HDL 45.10 01/24/2013   LDLCALC 86 01/24/2013   TRIG 148.0 01/24/2013   No results found for this basename: DDIMER   No components found with this basename: POCBNP,   Accessory Clinical Findings  Echocardiogram 01/04/2012 Study Conclusions  - Left ventricle: The cavity size was normal. Wall thickness was increased in a pattern of mild LVH. Systolic function was normal. The estimated ejection fraction was in the range of 60% to 65%. Wall motion was normal; there  were no regional wall motion abnormalities. Left ventricular diastolic function parameters were normal.  ECG - normal sinus rhythm, 83 beats per minute, nonspecific T wave abnormality.  Exercise nuclear stress test 12/12/2013 Exercise Capacity: Good exercise capacity. BP Response: Baseline HTN with Hypertensive blood pressure  response. DBP increased from 85 - 117 mmHg; SBP 157 - 193 mmHg Clinical Symptoms: The exercise was limited by dyspnea &  fatigue. No anginal pain.. ECG Impression: Significant ST abnormalities consistent with  ischemia. Comparison with Prior Nuclear Study: No images to compare LV Wall Motion: NL LV Function; NL Wall Motion  Overall Impression: Low to Intermediate risk stress nuclear  study Abnormal ECG portion of the Exercise Myoview with notable  ST depression concerning for ischemia along with PVCs in singlets & couplet that were more prominent . There is no Scintigraphic  finding to suggest ischemia, but the presence of significant  splanchnic attenuation makes the study less interpretable.  ECG: SR, negative T waves in the inferolateral leads, new when compared to te ECG on 12/06/2012   Assessment & Plan  A very pleasant 58 year old male   1. CAD, s/p CABG in 1999, - the patient is presenting with exertional dizziness and atypical nonexertional chest  pain. There are new inferolateral negative T waves on his EKG, however some were present on the prior EKGs but were more likely nonspecific changes and now they are much more pronounced. The patient has a history of prior coronary artery bypass grafting 16 years ago and therefore we would like to proceed with left cardiac catheterization.  He also had an abnormal ECG portion of the Exercise Myoview in December 2014 with notable ST depression concerning for ischemia along with PVCs in singlets & couplet that were more prominent . Since there was no scintigraphic finding of ischemia we didn't schedule cath at that time.   2. PAD, s/p carotid endarterectomy, stable on duplex carotids on 12/03/12, just followed with vascular surgery and had stable post-endarterectomy findings on the left and 40-59% stenosis on the R ICA.  3. HTN - controlled on current regimen, refills given  4. Hyperlipidemia - at goal, repeat in 1 year  Follow up after the cath.    Dorothy Spark, MD, [facility] 06/06/2013, 12:36 PM

## 2013-06-06 NOTE — Patient Instructions (Addendum)
Your physician has requested that you have a cardiac catheterization. Cardiac catheterization is used to diagnose and/or treat various heart conditions. Doctors may recommend this procedure for a number of different reasons. The most common reason is to evaluate chest pain. Chest pain can be a symptom of coronary artery disease (CAD), and cardiac catheterization can show whether plaque is narrowing or blocking your heart's arteries. This procedure is also used to evaluate the valves, as well as measure the blood flow and oxygen levels in different parts of your heart. For further information please visit [email or web address]. Please follow instruction sheet, as given.  SCHEDULED DATE IS ON 06-10-13 Tuesday AT 7 AM WITH DR Martinique   Your physician recommends that you return for lab work in: TODAY (PT/INR, CBC WITH DIFF, BMP)   F/U PENDING TESTS

## 2013-06-09 NOTE — Addendum Note (Signed)
Addended by: Dorothy Spark on: 06/09/2013 01:40 PM   Modules accepted: Orders

## 2013-06-10 ENCOUNTER — Encounter (HOSPITAL_COMMUNITY)
Admission: RE | Disposition: A | Discharge status: Home / Self Care | Payer: Self-pay | Source: Ambulatory Visit | Attending: Cardiology

## 2013-06-10 ENCOUNTER — Ambulatory Visit (HOSPITAL_COMMUNITY)
Admission: RE | Admit: 2013-06-10 | Discharge: 2013-06-10 | Disposition: A | Discharge status: Home / Self Care | Payer: 59 | Source: Ambulatory Visit | Attending: Cardiology | Admitting: Cardiology

## 2013-06-10 DIAGNOSIS — I739 Peripheral vascular disease, unspecified: Secondary | ICD-10-CM | POA: Insufficient documentation

## 2013-06-10 DIAGNOSIS — Z951 Presence of aortocoronary bypass graft: Secondary | ICD-10-CM | POA: Insufficient documentation

## 2013-06-10 DIAGNOSIS — I2510 Atherosclerotic heart disease of native coronary artery without angina pectoris: Secondary | ICD-10-CM | POA: Insufficient documentation

## 2013-06-10 DIAGNOSIS — R3915 Urgency of urination: Secondary | ICD-10-CM | POA: Insufficient documentation

## 2013-06-10 DIAGNOSIS — Z7982 Long term (current) use of aspirin: Secondary | ICD-10-CM | POA: Insufficient documentation

## 2013-06-10 DIAGNOSIS — I2582 Chronic total occlusion of coronary artery: Secondary | ICD-10-CM | POA: Insufficient documentation

## 2013-06-10 DIAGNOSIS — K219 Gastro-esophageal reflux disease without esophagitis: Secondary | ICD-10-CM | POA: Insufficient documentation

## 2013-06-10 DIAGNOSIS — I10 Essential (primary) hypertension: Secondary | ICD-10-CM | POA: Insufficient documentation

## 2013-06-10 DIAGNOSIS — M109 Gout, unspecified: Secondary | ICD-10-CM | POA: Insufficient documentation

## 2013-06-10 DIAGNOSIS — Z87891 Personal history of nicotine dependence: Secondary | ICD-10-CM | POA: Insufficient documentation

## 2013-06-10 DIAGNOSIS — R0789 Other chest pain: Secondary | ICD-10-CM | POA: Insufficient documentation

## 2013-06-10 DIAGNOSIS — E785 Hyperlipidemia, unspecified: Secondary | ICD-10-CM | POA: Insufficient documentation

## 2013-06-10 HISTORY — PX: LEFT HEART CATHETERIZATION WITH CORONARY/GRAFT ANGIOGRAM: SHX5450

## 2013-06-10 SURGERY — LEFT HEART CATHETERIZATION WITH CORONARY/GRAFT ANGIOGRAM
Anesthesia: LOCAL

## 2013-06-10 MED ORDER — SODIUM CHLORIDE 0.9 % IV SOLN: 250.0000 mL | INTRAVENOUS | Status: DC | PRN

## 2013-06-10 MED ORDER — SODIUM CHLORIDE 0.9 % IJ SOLN: 3.0000 mL | Freq: Two times a day (BID) | INTRAMUSCULAR | Status: DC

## 2013-06-10 MED ORDER — SODIUM CHLORIDE 0.9 % IV SOLN: 1.0000 mL/kg/h | INTRAVENOUS | Status: DC

## 2013-06-10 MED ORDER — LIDOCAINE HCL (PF) 1 % IJ SOLN
INTRAMUSCULAR | Status: AC
  Filled 2013-06-10: qty 30

## 2013-06-10 MED ORDER — SODIUM CHLORIDE 0.9 % IV SOLN
INTRAVENOUS | Status: DC
  Administered 2013-06-10: 08:00:00 via INTRAVENOUS

## 2013-06-10 MED ORDER — NITROGLYCERIN 0.2 MG/ML ON CALL CATH LAB
INTRAVENOUS | Status: AC
  Filled 2013-06-10: qty 1

## 2013-06-10 MED ORDER — MIDAZOLAM HCL 2 MG/2ML IJ SOLN
INTRAMUSCULAR | Status: AC
  Filled 2013-06-10: qty 2

## 2013-06-10 MED ORDER — HEPARIN (PORCINE) IN NACL 2-0.9 UNIT/ML-% IJ SOLN
INTRAMUSCULAR | Status: AC
  Filled 2013-06-10: qty 1000

## 2013-06-10 MED ORDER — FENTANYL CITRATE 0.05 MG/ML IJ SOLN
INTRAMUSCULAR | Status: AC
Start: 2013-06-10 — End: 2013-06-10
  Filled 2013-06-10: qty 2

## 2013-06-10 MED ORDER — SODIUM CHLORIDE 0.9 % IJ SOLN: 3.0000 mL | INTRAMUSCULAR | Status: DC | PRN

## 2013-06-10 MED ORDER — ASPIRIN 81 MG PO CHEW: 81.0000 mg | CHEWABLE_TABLET | ORAL | Status: DC

## 2013-06-10 NOTE — CV Procedure (Signed)
   Cardiac Catheterization Procedure Note  Name: Michael [name] MRN: [id number] DOB: Apr 13, 1955  Procedure: Left Heart Cath, Selective Coronary Angiography, SVG angiography, bilateral IMA angiography, LV angiography  Indication: 58 yo WM with history of CAD s/p CABG in 1999 presents with symptoms of atypical chest pain and low to intermediate risk stress test.    Procedural details: The right groin was prepped, draped, and anesthetized with 1% lidocaine. Using modified Seldinger technique, a 5 French sheath was introduced into the right femoral artery. Standard Judkins catheters were used for coronary and graft angiography and left ventriculography. Catheter exchanges were performed over a guidewire. There were no immediate procedural complications. The patient was transferred to the post catheterization recovery area for further monitoring.  Procedural Findings: Hemodynamics:  AO 128/70 mean 92 mm Hg LV 122/17 mm Hg   Coronary angiography: Coronary dominance: right  Left mainstem: 30% ostial.   Left anterior descending (LAD): The LAD is 100% occluded after the first diagonal.   Left circumflex (LCx): The LCx gives off a small OM 1. The OM 2 is occluded. The LCx has a 95% stenosis in the mid vessel prior to OM 2 then continues in the AV groove.   Right coronary artery (RCA): 100% occlusion proximally.   SVG to the second diagonal is widely patent. The diagonal is a large branch with 50% disease in the mid vessel.   SVG to OM 2 is patent. There is eccentric 30-40% narrowing in the mid graft. This may represent a valve and is not flow limiting.   The RIMA to the RCA is a very large graft and is widely patent. This fills the entire RCA except the PDA which is occluded. The PDA fills by left to right collaterals from the LIMA graft.  The LIMA to the LAD is widely patent.   Left ventriculography: Left ventricular systolic function is normal, LVEF is estimated at 55-65%, there is no  significant mitral regurgitation   Final Conclusions:   1. Severe 3 vessel obstructive CAD 2. All grafts are patent including SVG to diagonal 2, SVG to OM 2, RIMA to RCA, and LIMA to LAD.  3. Normal LV function.   Recommendations: Recommend continued medical therapy. His grafts are functioning very well. The PDA is occluded but fills by collaterals. The LCx in the AV groove has severe disease but does not supply much myocardium.   Peter M Martinique, [place] 06/10/2013, 11:36 AM

## 2013-06-10 NOTE — Discharge Instructions (Signed)

## 2013-06-10 NOTE — Interval H&P Note (Signed)
History and Physical Interval Note:  06/10/2013 10:59 AM  Michael [name]  has presented today for surgery, with the diagnosis of cp  The various methods of treatment have been discussed with the patient and family. After consideration of risks, benefits and other options for treatment, the patient has consented to  Procedure(s): LEFT HEART CATHETERIZATION WITH CORONARY/GRAFT ANGIOGRAM (N/A) as a surgical intervention .  The patient's history has been reviewed, patient examined, no change in status, stable for surgery.  I have reviewed the patient's chart and labs.  Questions were answered to the patient's satisfaction.   Cath Lab Visit (complete for each Cath Lab visit)  Clinical Evaluation Leading to the Procedure:   ACS: no  Non-ACS:    Anginal Classification: CCS II  Anti-ischemic medical therapy: Maximal Therapy (2 or more classes of medications)  Non-Invasive Test Results: Intermediate-risk stress test findings: cardiac mortality 1-3%/year  Prior CABG: Previous CABG        [name] [facility] 06/10/2013 10:59 AM

## 2013-06-10 NOTE — H&P (View-Only) (Signed)
Patient ID: Michael [name], male   DOB: 1955-02-09, 58 y.o.   MRN: [id number]    Patient Name: Michael [name] Date of Encounter: 06/06/2013  Primary Care Provider:  Rubbie Battiest, MD Primary Cardiologist:  Dorothy Spark   Problem List   Past Medical History  Diagnosis Date  . Coronary atherosclerosis of artery bypass graft   . Other and unspecified hyperlipidemia     takes Simvastatin daily  . Gout     takes Allopurinol daily  . Allergy   . CAD (coronary artery disease)   . Peripheral vascular disease   . Malignant hyperthermia     Potential but not Confirmed  . Essential hypertension, benign     Takes Amlodipine and Losartan-HCTZ daily;takes Coreg daily  . Anginal pain     last time a yr ago  . Back pain   . Neck pain     yrs ago and saw a chiropractor occasionally  . GERD (gastroesophageal reflux disease)     was on Prilosec;is not currently on any meds  . Urinary urgency   . Bruises easily    Past Surgical History  Procedure Laterality Date  . Ankle fracture surgery Left 1981  . Colonoscopy    . Inguinal hernia repair  Aug. 2005    bilateral inguinal  . Coronary artery bypass graft  1999    x 4  . Fracture surgery  1980    screws placed and in 1981 screws removed(this is when was told about potential for MH  . Esophagogastroduodenoscopy    . Cystoscopy    . Endarterectomy Left 04/29/2012    Procedure: ENDARTERECTOMY CAROTID;  Surgeon: Rosetta Posner, MD;  Location: [place];  Service: Vascular;  Laterality: Left;  with primary closure of artery  . Carotid endarterectomy Left 04-29-12    cea    Allergies  Allergies  Allergen Reactions  . Anesthetics, Amide Anaphylaxis  . Ramipril Cough    HPI  [name] is a former patient of Dr Verl Blalock. He has h/o CAD, CABG in 1999, PAD, s/p left carotid endarterectomy in April 2014, hypertension and hyperlipidemia. This is his 1 year follow up. He denies any chest pain and feels SOB on strenuous exercise. He underwent carotid  endarterectomy for symptoms of dizziness and neck tingling that has resolved partially. He still feels dizzy sometimes. He denies palpitations, orthopnea, PND or syncope. He has gained weight, the last year he started to exercise daily, but with symptoms of dizziness and recent surgery he stopped. He is motivated to do so. He states that sometimes he develops feet edema.  The patient was scheduled for 1 year follow up but is coming after 6 months and is reporting dizziness especially after he stands up but can occur while walking for a while as well. No syncope. He is also experiencing retrosternal tightness that is not exertional, the last one a week ago that persisted for hours. No obvious exacerbating or alleviating factors. No palpitations, no LE edema, orthopnea or PND.   Home Medications  Prior to Admission medications   Medication Sig Start Date End Date Taking? Authorizing Provider  allopurinol (ZYLOPRIM) 300 MG tablet Take 1 tablet (300 mg total) by mouth daily. 01/09/12  Yes Renella Cunas, MD  amLODipine (NORVASC) 10 MG tablet TAKE 1 TABLET DAILY 10/29/12  Yes Dorothy Spark, MD  aspirin 325 MG tablet Take 325 mg by mouth daily.   Yes Historical Provider, MD  carvedilol (COREG) 6.25 MG  tablet Take 1 tablet (6.25 mg total) by mouth 2 (two) times daily with a meal. 02/07/12 02/06/13 Yes Renella Cunas, MD  losartan-hydrochlorothiazide [facility]) 100-12.5 MG per tablet TAKE 1 TABLET DAILY 09/20/12  Yes Thayer Headings, MD  Multiple Vitamin (MULTI-VITAMIN DAILY PO) Take 1 tablet by mouth daily.    Yes Historical Provider, MD  simvastatin (ZOCOR) 20 MG tablet TAKE 1 TABLET AT BEDTIME 10/29/12  Yes Dorothy Spark, MD    Family History  Family History  Problem Relation Age of Onset  . Kidney failure Mother   . Cancer Mother   . Arthritis Mother   . Kidney disease Mother   . Hypertension Father   . Healthy Father   . CVA Brother 3  . Hypertension Brother     Social History  History     Social History  . Marital Status: Divorced    Spouse Name: N/A    Number of Children: N/A  . Years of Education: N/A   Occupational History  . CONSTRUCTION/MAINT     [employer]   Social History Main Topics  . Smoking status: Former Smoker    Quit date: 05/14/1996  . Smokeless tobacco: Never Used     Comment: quit 1995  . Alcohol Use: 3.6 oz/week    6 Cans of beer per week  . Drug Use: No  . Sexual Activity: No   Other Topics Concern  . Not on file   Social History Narrative  . No narrative on file    Review of Systems, as per HPI, otherwise negative General:  No chills, fever, night sweats or weight changes.  Cardiovascular:  No chest pain, dyspnea on exertion, edema, orthopnea, palpitations, paroxysmal nocturnal dyspnea. Dermatological: No rash, lesions/masses Respiratory: No cough, dyspnea Urologic: No hematuria, dysuria Abdominal:   No nausea, vomiting, diarrhea, bright red blood per rectum, melena, or hematemesis Neurologic:  No visual changes, wkns, changes in mental status. All other systems reviewed and are otherwise negative except as noted above.  Physical Exam  Blood pressure 128/72, pulse 77, height 5\' 10"  (1.778 m), weight 229 lb (103.874 kg).  Orthostatics negative. General: Pleasant, NAD Psych: Normal affect. Neuro: Alert and oriented X 3. Moves all extremities spontaneously. HEENT: Normal  Neck: Supple without bruits or JVD. Lungs:  Resp regular and unlabored, CTA. Heart: RRR no s3, s4, or murmurs. Abdomen: Soft, non-tender, non-distended, BS + x 4.  Extremities: No clubbing, cyanosis or edema. DP/PT/Radials 2+ and equal bilaterally.  Labs:  No results found for this basename: CKTOTAL, CKMB, TROPONINI,  in the last 72 hours Lab Results  Component Value Date   WBC 6.7 04/30/2012   HGB 11.6* 04/30/2012   HCT 32.3* 04/30/2012   MCV 89.2 04/30/2012   PLT 140* 04/30/2012   No results found for this basename: NA, K, CL, CO2, BUN, CREATININE,  CALCIUM, LABALBU, PROT, BILITOT, ALKPHOS, ALT, AST, GLUCOSE,  in the last 168 hours Lab Results  Component Value Date   CHOL 161 01/24/2013   HDL 45.10 01/24/2013   LDLCALC 86 01/24/2013   TRIG 148.0 01/24/2013   No results found for this basename: DDIMER   No components found with this basename: POCBNP,   Accessory Clinical Findings  Echocardiogram 01/04/2012 Study Conclusions  - Left ventricle: The cavity size was normal. Wall thickness was increased in a pattern of mild LVH. Systolic function was normal. The estimated ejection fraction was in the range of 60% to 65%. Wall motion was normal; there  were no regional wall motion abnormalities. Left ventricular diastolic function parameters were normal.  ECG - normal sinus rhythm, 83 beats per minute, nonspecific T wave abnormality.  Exercise nuclear stress test 12/12/2013 Exercise Capacity: Good exercise capacity. BP Response: Baseline HTN with Hypertensive blood pressure  response. DBP increased from 85 - 117 mmHg; SBP 157 - 193 mmHg Clinical Symptoms: The exercise was limited by dyspnea &  fatigue. No anginal pain.. ECG Impression: Significant ST abnormalities consistent with  ischemia. Comparison with Prior Nuclear Study: No images to compare LV Wall Motion: NL LV Function; NL Wall Motion  Overall Impression: Low to Intermediate risk stress nuclear  study Abnormal ECG portion of the Exercise Myoview with notable  ST depression concerning for ischemia along with PVCs in singlets & couplet that were more prominent . There is no Scintigraphic  finding to suggest ischemia, but the presence of significant  splanchnic attenuation makes the study less interpretable.  ECG: SR, negative T waves in the inferolateral leads, new when compared to te ECG on 12/06/2012   Assessment & Plan  A very pleasant 58 year old male   1. CAD, s/p CABG in 1999, - the patient is presenting with exertional dizziness and atypical nonexertional chest  pain. There are new inferolateral negative T waves on his EKG, however some were present on the prior EKGs but were more likely nonspecific changes and now they are much more pronounced. The patient has a history of prior coronary artery bypass grafting 16 years ago and therefore we would like to proceed with left cardiac catheterization.  He also had an abnormal ECG portion of the Exercise Myoview in December 2014 with notable ST depression concerning for ischemia along with PVCs in singlets & couplet that were more prominent . Since there was no scintigraphic finding of ischemia we didn't schedule cath at that time.   2. PAD, s/p carotid endarterectomy, stable on duplex carotids on 12/03/12, just followed with vascular surgery and had stable post-endarterectomy findings on the left and 40-59% stenosis on the R ICA.  3. HTN - controlled on current regimen, refills given  4. Hyperlipidemia - at goal, repeat in 1 year  Follow up after the cath.    Dorothy Spark, MD, [facility] 06/06/2013, 12:36 PM

## 2013-08-08 ENCOUNTER — Other Ambulatory Visit: Payer: Self-pay | Admitting: Cardiology

## 2013-10-06 ENCOUNTER — Encounter: Payer: Self-pay | Admitting: Family Medicine

## 2013-10-08 ENCOUNTER — Ambulatory Visit (INDEPENDENT_AMBULATORY_CARE_PROVIDER_SITE_OTHER): Payer: 59 | Admitting: Family Medicine

## 2013-10-08 ENCOUNTER — Encounter: Payer: Self-pay | Admitting: Family Medicine

## 2013-10-08 ENCOUNTER — Telehealth: Payer: Self-pay

## 2013-10-08 VITALS — BP 138/84 | Ht 69.5 in | Wt 225.4 lb

## 2013-10-08 DIAGNOSIS — Z125 Encounter for screening for malignant neoplasm of prostate: Secondary | ICD-10-CM

## 2013-10-08 DIAGNOSIS — M109 Gout, unspecified: Secondary | ICD-10-CM

## 2013-10-08 DIAGNOSIS — E785 Hyperlipidemia, unspecified: Secondary | ICD-10-CM

## 2013-10-08 DIAGNOSIS — Z0000 Encounter for general adult medical examination without abnormal findings: Secondary | ICD-10-CM

## 2013-10-08 DIAGNOSIS — Z79899 Other long term (current) drug therapy: Secondary | ICD-10-CM

## 2013-10-08 LAB — BASIC METABOLIC PANEL
BUN: 13 mg/dL (ref 6–23)
CO2: 30 mEq/L (ref 19–32)
CREATININE: 1.08 mg/dL (ref 0.50–1.35)
Calcium: 9.7 mg/dL (ref 8.4–10.5)
Chloride: 99 mEq/L (ref 96–112)
Glucose, Bld: 108 mg/dL — ABNORMAL HIGH (ref 70–99)
Potassium: 4.4 mEq/L (ref 3.5–5.3)
Sodium: 139 mEq/L (ref 135–145)

## 2013-10-08 LAB — LIPID PANEL
Cholesterol: 144 mg/dL (ref 0–200)
HDL: 49 mg/dL (ref >39)
LDL Cholesterol: 68 mg/dL (ref 0–99)
Total CHOL/HDL Ratio: 2.9 Ratio
Triglycerides: 134 mg/dL (ref <150)
VLDL: 27 mg/dL (ref 0–40)

## 2013-10-08 LAB — HEPATIC FUNCTION PANEL
ALK PHOS: 61 U/L (ref 39–117)
ALT: 27 U/L (ref 0–53)
AST: 24 U/L (ref 0–37)
Albumin: 4.4 g/dL (ref 3.5–5.2)
Bilirubin, Direct: 0.1 mg/dL (ref 0.0–0.3)
Indirect Bilirubin: 0.5 mg/dL (ref 0.2–1.2)
Total Bilirubin: 0.6 mg/dL (ref 0.2–1.2)
Total Protein: 7.3 g/dL (ref 6.0–8.3)

## 2013-10-08 LAB — URIC ACID: URIC ACID, SERUM: 5.6 mg/dL (ref 4.0–7.8)

## 2013-10-08 NOTE — Telephone Encounter (Signed)
PATIENT CAME INTO OFFICE STATING DR Linzie Collin HIM TO HAVE COLONOSCOPY   [phone]      PLEASE CALL

## 2013-10-08 NOTE — Progress Notes (Signed)
   Subjective:    Patient ID: Michael [name], male    DOB: 04/07/1955, 58 y.o.   MRN: [id number]  HPI The patient comes in today for a wellness visit.    A review of their health history was completed.  A review of medications was also completed.  Any needed refills: none  Eating habits: not good  Falls/  MVA accidents in past few months: none  Regular exercise: not good  Specialist pt sees on regular basis: cardiology, vein specialist  Preventative health issues were discussed.   Additional concerns: Patient states he has some left elbow pain that has been present for about 1 month now.  Patient would like to discuss preventative screening tests he should have done also.   Carotid endarterectomt of left yr ago,  Exercising not any these days, was walking regularly  Not eating the best more a meat and potatoes guy,  Sugar slightly elevated  Colonoscopy done at age 41, likely has been before 68 and so therefore ten yrs.  Pt has not had screening p e recently    Review of Systems  Constitutional: Negative for fever, activity change and appetite change.  HENT: Negative for congestion and rhinorrhea.   Eyes: Negative for discharge.  Respiratory: Negative for cough and wheezing.   Cardiovascular: Negative for chest pain.  Gastrointestinal: Negative for vomiting, abdominal pain and blood in stool.  Genitourinary: Negative for frequency and difficulty urinating.  Musculoskeletal: Negative for neck pain.       Musculoskeletal concerns with pain in various areas. To be clarified further at next visit.  Skin: Negative for rash.  Allergic/Immunologic: Negative for environmental allergies and food allergies.  Neurological: Negative for weakness and headaches.  Psychiatric/Behavioral: Negative for agitation.  All other systems reviewed and are negative.      Objective:   Physical Exam  Vitals reviewed. Constitutional: He appears well-developed and well-nourished.    HENT:  Head: Normocephalic and atraumatic.  Right Ear: External ear normal.  Left Ear: External ear normal.  Nose: Nose normal.  Mouth/Throat: Oropharynx is clear and moist.  Eyes: EOM are normal. Pupils are equal, round, and reactive to light.  Neck: Normal range of motion. Neck supple. No thyromegaly present.  Cardiovascular: Normal rate, regular rhythm and normal heart sounds.   No murmur heard. Pulmonary/Chest: Effort normal and breath sounds normal. No respiratory distress. He has no wheezes.  Abdominal: Soft. Bowel sounds are normal. He exhibits no distension and no mass. There is no tenderness.  Genitourinary: Penis normal.  Prostate within normal limits  Musculoskeletal: Normal range of motion. He exhibits no edema.  Lymphadenopathy:    He has no cervical adenopathy.  Neurological: He is alert. He exhibits normal muscle tone.  Skin: Skin is warm and dry. No erythema.  Psychiatric: He has a normal mood and affect. His behavior is normal. Judgment normal.          Assessment & Plan:  Impression 1 well adult exam #2 multiple chronic concerns to be approached at followup visit. #3 no colonoscopy for 10 years. #4 gout patient unsure of having attacks #5 coronary artery disease. #6 recent carotid endarterectomy plan appropriate blood work. Diet exercise discussed in encourage. Followup as scheduled. WSL

## 2013-10-09 LAB — PSA: PSA: 1.04 ng/mL (ref <=4.00)

## 2013-10-14 ENCOUNTER — Encounter: Payer: Self-pay | Admitting: Family Medicine

## 2013-10-20 ENCOUNTER — Telehealth: Payer: Self-pay

## 2013-10-20 NOTE — Telephone Encounter (Signed)
LMOM to call to schedule colonoscopy. Referred by Dr. Wolfgang Phoenix.

## 2013-10-20 NOTE — Telephone Encounter (Signed)
See note of 10/20/2013.

## 2013-10-23 ENCOUNTER — Ambulatory Visit (INDEPENDENT_AMBULATORY_CARE_PROVIDER_SITE_OTHER): Payer: 59 | Admitting: Family Medicine

## 2013-10-23 ENCOUNTER — Encounter: Payer: Self-pay | Admitting: Family Medicine

## 2013-10-23 VITALS — BP 136/84 | Ht 69.5 in | Wt 230.0 lb

## 2013-10-23 DIAGNOSIS — T783XXA Angioneurotic edema, initial encounter: Secondary | ICD-10-CM

## 2013-10-23 NOTE — Progress Notes (Signed)
Subjective:    Patient ID: Beather Arbour, male    DOB: 02-11-1955, 58 y.o.   MRN: [id number]  HPI Patient here today for a follow-up visit concerning lab work. Patient has questions about what is involved in having a physical exam?  Results for orders placed in visit on 10/08/13  PSA      Result Value Ref Range   PSA 1.04  <=4.00 ng/mL  LIPID PANEL      Result Value Ref Range   Cholesterol 144  0 - 200 mg/dL   Triglycerides 134  <150 mg/dL   HDL 49  >39 mg/dL   Total CHOL/HDL Ratio 2.9     VLDL 27  0 - 40 mg/dL   LDL Cholesterol 68  0 - 99 mg/dL  HEPATIC FUNCTION PANEL      Result Value Ref Range   Total Bilirubin 0.6  0.2 - 1.2 mg/dL   Bilirubin, Direct 0.1  0.0 - 0.3 mg/dL   Indirect Bilirubin 0.5  0.2 - 1.2 mg/dL   Alkaline Phosphatase 61  39 - 117 U/L   AST 24  0 - 37 U/L   ALT 27  0 - 53 U/L   Total Protein 7.3  6.0 - 8.3 g/dL   Albumin 4.4  3.5 - 5.2 g/dL  BASIC METABOLIC PANEL      Result Value Ref Range   Sodium 139  135 - 145 mEq/L   Potassium 4.4  3.5 - 5.3 mEq/L   Chloride 99  96 - 112 mEq/L   CO2 30  19 - 32 mEq/L   Glucose, Bld 108 (*) 70 - 99 mg/dL   BUN 13  6 - 23 mg/dL   Creat 1.08  0.50 - 1.35 mg/dL   Calcium 9.7  8.4 - 10.5 mg/dL  URIC ACID      Result Value Ref Range   Uric Acid, Serum 5.6  4.0 - 7.8 mg/dL   Swelling off and on, comes and goes  Intermittent in the face  Occurred last flare just a week after last visit, swelling almost bad enough to cause to stay home from work  Later in the week right hand swelled up.  Left elbow acts up at time  Lateral epicondyle intermittenly, right hand ed   Intermittent splotchy rash, off and on, itches, crops up on the trunk off and on,, comes and goes,  Intermittent tender sore crop up in the now  Intermittently left lateral elbow.  No true gout symptoms  Review of Systems No headache no chest pain no back pain no abdominal pain no change in bowel habits    Objective:   Physical Exam  Alert  vitals stable. No acute distress HEENT normal. No angioedema present at this time. Lungs clear. Heart regular rate and rhythm. Ankles without edema.      Assessment & Plan:  Impression #1 intermittent angioedema.  I advised the patient this rarely can be associated with losartan. Of note patient developed ACE inhibitor cough. I offered him one or 2 choices we could set him up with an allergy specialist or he could discuss possible change in blood pressure therapy with his cardiologist. #2 left epicondylitis management discussed #3 hypertension currently good control #4 gout symptoms and joints not associated. Uric acid excellent. plan Will cc a copy of this to patient's cardiologist. I advised the patient he will likely need to go in and discuss this face-to-face, but he asked that we send this along with  notifying the cardiologist of our potential concerns. Form strap for epicondyle. Maintain other medications. WSL

## 2013-10-23 NOTE — Patient Instructions (Signed)
This is called angioedema   Often associated with losartan  Get a velcro forearm strap for forearm

## 2013-10-24 ENCOUNTER — Encounter: Payer: Self-pay | Admitting: Cardiology

## 2013-10-24 DIAGNOSIS — T783XXA Angioneurotic edema, initial encounter: Secondary | ICD-10-CM | POA: Insufficient documentation

## 2013-10-24 MED ORDER — HYDROCHLOROTHIAZIDE 25 MG PO TABS: 25.0000 mg | ORAL_TABLET | Freq: Every day | ORAL | Status: DC

## 2013-10-24 NOTE — Telephone Encounter (Signed)
Informed the pt that per Dr Meda Coffee he should should not use losartan/HCTZ now or in the future. Informed the pt that Dr Meda Coffee wants him to continue taking his Carvedilol 12.5 mg po BID and add HCTZ 25 mg po daily.  Also informed the pt that per Dr Meda Coffee he should follow for a nurse visit for BP control in 2-3 weeks. Confirmed the pharmacy of choice with the pt.  Scheduled the pt for a nurse visit for 11/14/13 at 9:00 am for BP check.  Pt confirmed this date.  Pt verbalized understanding and agrees with this plan.

## 2013-10-29 ENCOUNTER — Telehealth: Payer: Self-pay | Admitting: *Deleted

## 2013-10-29 NOTE — Telephone Encounter (Signed)
Left message on 10/29/13 @ 2:22pm. Asked Patient to call back & ask for [employer].

## 2013-10-29 NOTE — Telephone Encounter (Signed)
See phone call of 10/29/2013.

## 2013-11-02 ENCOUNTER — Other Ambulatory Visit: Payer: Self-pay

## 2013-11-02 MED ORDER — HYDROCHLOROTHIAZIDE 25 MG PO TABS: 25.0000 mg | ORAL_TABLET | Freq: Every day | ORAL | Status: DC

## 2013-11-03 ENCOUNTER — Telehealth: Payer: Self-pay

## 2013-11-03 ENCOUNTER — Other Ambulatory Visit: Payer: Self-pay

## 2013-11-03 DIAGNOSIS — Z1211 Encounter for screening for malignant neoplasm of colon: Secondary | ICD-10-CM

## 2013-11-03 NOTE — Telephone Encounter (Signed)
NOTE: PT SAID HE HAS REGULAR DAILY BM'S, BUT DOES NOT ALWAYS FEEL LIKE HE GETS RID OF ALL OF THE STOOL. HE DECLINED AN OV, SINCE HE WANTS TO GET THIS OVER BEFORE THE FIRST OF THE YEAR.

## 2013-11-03 NOTE — Telephone Encounter (Signed)
Gastroenterology Pre-Procedure Review  Request Date: 11/03/2013 Requesting Physician: Dr. Wolfgang Phoenix  PATIENT REVIEW QUESTIONS: The patient responded to the following health history questions as indicated:    Pt's first colonoscopy was 5/5/20015 by Dr. Earle Gell at [facility]  1. Diabetes Melitis: no 2. Joint replacements in the past 12 months: no 3. Major health problems in the past 3 months: no 4. Has an artificial valve or MVP: no 5. Has a defibrillator: no 6. Has been advised in past to take antibiotics in advance of a procedure like teeth cleaning: no    MEDICATIONS & ALLERGIES:    Patient reports the following regarding taking any blood thinners:   Plavix? no  Aspirin? YES Coumadin? no  Patient confirms/reports the following medications:  Current Outpatient Prescriptions  Medication Sig Dispense Refill  . allopurinol (ZYLOPRIM) 300 MG tablet Take 1 tablet (300 mg total) by mouth daily. 90 tablet 3  . amLODipine (NORVASC) 10 MG tablet Take 10 mg by mouth at bedtime.    Marland Kitchen aspirin 325 MG tablet Take 325 mg by mouth daily.    . carvedilol (COREG) 12.5 MG tablet Take 1 tablet (12.5 mg total) by mouth 2 (two) times daily. 180 tablet 3  . Fexofenadine HCl (ALLEGRA PO) Take 1 tablet by mouth daily as needed (allergies).    . hydrochlorothiazide (HYDRODIURIL) 25 MG tablet Take 1 tablet (25 mg total) by mouth daily. 90 tablet 3  . indomethacin (INDOCIN) 25 MG capsule Take 25 mg by mouth 2 (two) times daily as needed (Gout pain).    Marland Kitchen losartan-hydrochlorothiazide (HYZAAR) 100-12.5 MG per tablet TAKE 1 TABLET DAILY 90 tablet 0  . Multiple Vitamin (MULTI-VITAMIN DAILY PO) Take 1 tablet by mouth daily.     Marland Kitchen SALINE NASAL MIST NA Place 1 spray into both nostrils daily as needed (allergies).    . simvastatin (ZOCOR) 20 MG tablet Take 20 mg by mouth daily.     No current facility-administered medications for this visit.    Patient confirms/reports the following allergies:  Allergies   Allergen Reactions  . Anesthetics, Amide Anaphylaxis  . Ramipril Cough    No orders of the defined types were placed in this encounter.    AUTHORIZATION INFORMATION Primary Insurance:   ID #:  Group #:  Pre-Cert / Auth required:  Pre-Cert / Auth #:   Secondary Insurance:   ID #:  Group #:  Pre-Cert / Auth required: Pre-Cert / Auth #:   SCHEDULE INFORMATION: Procedure has been scheduled as follows:  Date: 11/21/2013              Time:  1:15 Location: [facility] Short Stay  This Gastroenterology Pre-Precedure Review Form is being routed to the following provider(s): Barney Drain, MD

## 2013-11-03 NOTE — Telephone Encounter (Signed)
Pt was calling to speak with DS. Erline Levine did a phone note on him at 126pm and DS is aware that patient is on the phone and she will get to him shortly.\

## 2013-11-03 NOTE — Telephone Encounter (Signed)
PATIENT RETURNED CALL TO SCHEDULE COLONOSCOPY.    PLEASE CALL PATIENT ON CELL PHONE [phone]

## 2013-11-03 NOTE — Telephone Encounter (Signed)
See separate triage.  

## 2013-11-04 NOTE — Telephone Encounter (Signed)
See separate triage.  

## 2013-11-04 NOTE — Telephone Encounter (Signed)
PREPOPIK-DRINK WATER TO KEEP URINE LIGHT YELLOW.  PT SHOULD DROP OFF RX 3 DAYS PRIOR TO PROCEDURE.  

## 2013-11-05 MED ORDER — SOD PICOSULFATE-MAG OX-CIT ACD 10-3.5-12 MG-GM-GM PO PACK: 1.0000 | PACK | ORAL | Status: DC

## 2013-11-05 NOTE — Addendum Note (Signed)
Addended by: Everardo All on: 11/05/2013 08:49 AM   Modules accepted: Orders

## 2013-11-06 NOTE — Telephone Encounter (Signed)
Rx sent to the pharmacy and instructions mailed to pt.  

## 2013-11-13 ENCOUNTER — Telehealth: Payer: Self-pay

## 2013-11-13 NOTE — Telephone Encounter (Signed)
I spoke to Michael [name] who said that a PA is required for a screening colonoscopy at an outpatient  Facility. She gave a case # [id number] and said it could take up to 20 days. However, she said it will be approved, just a process.  She said they will call if additional info is needed.

## 2013-11-14 ENCOUNTER — Ambulatory Visit (INDEPENDENT_AMBULATORY_CARE_PROVIDER_SITE_OTHER): Payer: 59 | Admitting: *Deleted

## 2013-11-14 VITALS — BP 138/78 | HR 78 | Wt 223.0 lb

## 2013-11-14 DIAGNOSIS — I10 Essential (primary) hypertension: Secondary | ICD-10-CM

## 2013-11-14 NOTE — Progress Notes (Signed)
Pt here for blood pressure check. Losartan recently stopped due to angioedema. He reports he is feeling well. Does not check blood pressure at home on regular basis.  Has no home readings to report.   Blood pressure today is 138/78 and pulse 78.  He will continue current medication and follow up with Dr. Meda Coffee as scheduled in January.

## 2013-11-21 ENCOUNTER — Encounter (HOSPITAL_COMMUNITY): Payer: Self-pay | Admitting: *Deleted

## 2013-11-21 ENCOUNTER — Ambulatory Visit (HOSPITAL_COMMUNITY)
Admission: RE | Admit: 2013-11-21 | Discharge: 2013-11-21 | Disposition: A | Discharge status: Home / Self Care | Payer: 59 | Source: Ambulatory Visit | Attending: Gastroenterology | Admitting: Gastroenterology

## 2013-11-21 ENCOUNTER — Encounter (HOSPITAL_COMMUNITY)
Admission: RE | Disposition: A | Discharge status: Home / Self Care | Payer: Self-pay | Source: Ambulatory Visit | Attending: Gastroenterology

## 2013-11-21 DIAGNOSIS — K648 Other hemorrhoids: Secondary | ICD-10-CM | POA: Insufficient documentation

## 2013-11-21 DIAGNOSIS — I10 Essential (primary) hypertension: Secondary | ICD-10-CM | POA: Diagnosis not present

## 2013-11-21 DIAGNOSIS — Z1211 Encounter for screening for malignant neoplasm of colon: Secondary | ICD-10-CM | POA: Insufficient documentation

## 2013-11-21 DIAGNOSIS — K5730 Diverticulosis of large intestine without perforation or abscess without bleeding: Secondary | ICD-10-CM | POA: Insufficient documentation

## 2013-11-21 DIAGNOSIS — Z79899 Other long term (current) drug therapy: Secondary | ICD-10-CM | POA: Diagnosis not present

## 2013-11-21 DIAGNOSIS — Z951 Presence of aortocoronary bypass graft: Secondary | ICD-10-CM | POA: Diagnosis not present

## 2013-11-21 DIAGNOSIS — K635 Polyp of colon: Secondary | ICD-10-CM

## 2013-11-21 DIAGNOSIS — E785 Hyperlipidemia, unspecified: Secondary | ICD-10-CM | POA: Diagnosis not present

## 2013-11-21 DIAGNOSIS — I2510 Atherosclerotic heart disease of native coronary artery without angina pectoris: Secondary | ICD-10-CM | POA: Diagnosis not present

## 2013-11-21 DIAGNOSIS — Q438 Other specified congenital malformations of intestine: Secondary | ICD-10-CM | POA: Diagnosis not present

## 2013-11-21 DIAGNOSIS — K621 Rectal polyp: Secondary | ICD-10-CM

## 2013-11-21 DIAGNOSIS — Z809 Family history of malignant neoplasm, unspecified: Secondary | ICD-10-CM | POA: Diagnosis not present

## 2013-11-21 DIAGNOSIS — Z87891 Personal history of nicotine dependence: Secondary | ICD-10-CM | POA: Insufficient documentation

## 2013-11-21 DIAGNOSIS — D125 Benign neoplasm of sigmoid colon: Secondary | ICD-10-CM | POA: Insufficient documentation

## 2013-11-21 HISTORY — PX: COLONOSCOPY: SHX5424

## 2013-11-21 SURGERY — COLONOSCOPY
Anesthesia: Moderate Sedation

## 2013-11-21 MED ORDER — MIDAZOLAM HCL 5 MG/5ML IJ SOLN
INTRAMUSCULAR | Status: AC
  Filled 2013-11-21: qty 10

## 2013-11-21 MED ORDER — MIDAZOLAM HCL 5 MG/5ML IJ SOLN
INTRAMUSCULAR | Status: DC | PRN
  Administered 2013-11-21 (×2): 2 mg via INTRAVENOUS
  Administered 2013-11-21 (×2): 1 mg via INTRAVENOUS

## 2013-11-21 MED ORDER — SODIUM CHLORIDE 0.9 % IV SOLN
INTRAVENOUS | Status: DC
  Administered 2013-11-21: 13:00:00 via INTRAVENOUS

## 2013-11-21 MED ORDER — MEPERIDINE HCL 100 MG/ML IJ SOLN
INTRAMUSCULAR | Status: AC
  Filled 2013-11-21: qty 2

## 2013-11-21 MED ORDER — MEPERIDINE HCL 100 MG/ML IJ SOLN
INTRAMUSCULAR | Status: DC | PRN
  Administered 2013-11-21 (×4): 25 mg

## 2013-11-21 NOTE — Discharge Instructions (Signed)
You had 3 small polyps removed. You have SMALL internal hemorrhoids AND DIVERTICULOSIS IN YOUR LEFT COLON.   FOLLOW A HIGH FIBER DIET. AVOID ITEMS THAT CAUSE BLOATING & GAS. SEE INFO BELOW.  YOUR BIOPSY RESULTS SHOULD BE BACK IN 14 DAYS.  Next colonoscopy in 5-10 years.    Colonoscopy Care After Read the instructions outlined below and refer to this sheet in the next week. These discharge instructions provide you with general information on caring for yourself after you leave the hospital. While your treatment has been planned according to the most current medical practices available, unavoidable complications occasionally occur. If you have any problems or questions after discharge, call DR. Ansley Mangiapane, [phone].  ACTIVITY  You may resume your regular activity, but move at a slower pace for the next 24 hours.   Take frequent rest periods for the next 24 hours.   Walking will help get rid of the air and reduce the bloated feeling in your belly (abdomen).   No driving for 24 hours (because of the medicine (anesthesia) used during the test).   You may shower.   Do not sign any important legal documents or operate any machinery for 24 hours (because of the anesthesia used during the test).    NUTRITION  Drink plenty of fluids.   You may resume your normal diet as instructed by your doctor.   Begin with a light meal and progress to your normal diet. Heavy or fried foods are harder to digest and may make you feel sick to your stomach (nauseated).   Avoid alcoholic beverages for 24 hours or as instructed.    MEDICATIONS  You may resume your normal medications.   WHAT YOU CAN EXPECT TODAY  Some feelings of bloating in the abdomen.   Passage of more gas than usual.   Spotting of blood in your stool or on the toilet paper  .  IF YOU HAD POLYPS REMOVED DURING THE COLONOSCOPY:  Eat a soft diet IF YOU HAVE NAUSEA, BLOATING, ABDOMINAL PAIN, OR VOMITING.    FINDING OUT  THE RESULTS OF YOUR TEST Not all test results are available during your visit. DR. Oneida Alar WILL CALL YOU WITHIN 14 DAYS OF YOUR PROCEDUE WITH YOUR RESULTS. Do not assume everything is normal if you have not heard from DR. Ivelise Castillo IN 14 DAYS, CALL HER OFFICE AT [phone].  SEEK IMMEDIATE MEDICAL ATTENTION AND CALL THE OFFICE: [phone] IF:  You have more than a spotting of blood in your stool.   Your belly is swollen (abdominal distention).   You are nauseated or vomiting.   You have a temperature over 101F.   You have abdominal pain or discomfort that is severe or gets worse throughout the day.   High-Fiber Diet A high-fiber diet changes your normal diet to include more whole grains, legumes, fruits, and vegetables. Changes in the diet involve replacing refined carbohydrates with unrefined foods. The calorie level of the diet is essentially unchanged. The Dietary Reference Intake (recommended amount) for adult males is 38 grams per day. For adult females, it is 25 grams per day. Pregnant and lactating women should consume 28 grams of fiber per day. Fiber is the intact part of a plant that is not broken down during digestion. Functional fiber is fiber that has been isolated from the plant to provide a beneficial effect in the body. PURPOSE  Increase stool bulk.   Ease and regulate bowel movements.   Lower cholesterol.  INDICATIONS THAT YOU NEED MORE FIBER  Constipation and hemorrhoids.   Uncomplicated diverticulosis (intestine condition) and irritable bowel syndrome.   Weight management.   As a protective measure against hardening of the arteries (atherosclerosis), diabetes, and cancer.   GUIDELINES FOR INCREASING FIBER IN THE DIET  Start adding fiber to the diet slowly. A gradual increase of about 5 more grams (2 slices of whole-wheat bread, 2 servings of most fruits or vegetables, or 1 bowl of high-fiber cereal) per day is best. Too rapid an increase in fiber may result in  constipation, flatulence, and bloating.   Drink enough water and fluids to keep your urine clear or pale yellow. Water, juice, or caffeine-free drinks are recommended. Not drinking enough fluid may cause constipation.   Eat a variety of high-fiber foods rather than one type of fiber.   Try to increase your intake of fiber through using high-fiber foods rather than fiber pills or supplements that contain small amounts of fiber.   The goal is to change the types of food eaten. Do not supplement your present diet with high-fiber foods, but replace foods in your present diet.  INCLUDE A VARIETY OF FIBER SOURCES  Replace refined and processed grains with whole grains, canned fruits with fresh fruits, and incorporate other fiber sources. White rice, white breads, and most bakery goods contain little or no fiber.   Brown whole-grain rice, buckwheat oats, and many fruits and vegetables are all good sources of fiber. These include: broccoli, Brussels sprouts, cabbage, cauliflower, beets, sweet potatoes, white potatoes (skin on), carrots, tomatoes, eggplant, squash, berries, fresh fruits, and dried fruits.   Cereals appear to be the richest source of fiber. Cereal fiber is found in whole grains and bran. Bran is the fiber-rich outer coat of cereal grain, which is largely removed in refining. In whole-grain cereals, the bran remains. In breakfast cereals, the largest amount of fiber is found in those with "bran" in their names. The fiber content is sometimes indicated on the label.   You may need to include additional fruits and vegetables each day.   In baking, for 1 cup white flour, you may use the following substitutions:   1 cup whole-wheat flour minus 2 tablespoons.   1/2 cup white flour plus 1/2 cup whole-wheat flour.   Polyps, Colon  A polyp is extra tissue that grows inside your body. Colon polyps grow in the large intestine. The large intestine, also called the colon, is part of your  digestive system. It is a long, hollow tube at the end of your digestive tract where your body makes and stores stool. Most polyps are not dangerous. They are benign. This means they are not cancerous. But over time, some types of polyps can turn into cancer. Polyps that are smaller than a pea are usually not harmful. But larger polyps could someday become or may already be cancerous. To be safe, doctors remove all polyps and test them.   WHO GETS POLYPS? Anyone can get polyps, but certain people are more likely than others. You may have a greater chance of getting polyps if:  You are over 50.   You have had polyps before.   Someone in your family has had polyps.   Someone in your family has had cancer of the large intestine.   Find out if someone in your family has had polyps. You may also be more likely to get polyps if you:   Eat a lot of fatty foods   Smoke   Drink alcohol   Do  not exercise  Eat too much   TREATMENT  The caregiver will remove the polyp during sigmoidoscopy or colonoscopy.    PREVENTION There is not one sure way to prevent polyps. You might be able to lower your risk of getting them if you:  Eat more fruits and vegetables and less fatty food.   Do not smoke.   Avoid alcohol.   Exercise every day.   Lose weight if you are overweight.   Eating more calcium and folate can also lower your risk of getting polyps. Some foods that are rich in calcium are milk, cheese, and broccoli. Some foods that are rich in folate are chickpeas, kidney beans, and spinach.   Hemorrhoids Hemorrhoids are dilated (enlarged) veins around the rectum. Sometimes clots will form in the veins. This makes them swollen and painful. These are called thrombosed hemorrhoids. Causes of hemorrhoids include:  Constipation.   Straining to have a bowel movement.   HEAVY LIFTING HOME CARE INSTRUCTIONS  Eat a well balanced diet and drink 6 to 8 glasses of water every day to avoid  constipation. You may also use a bulk laxative.   Avoid straining to have bowel movements.   Keep anal area dry and clean.   Do not use a donut shaped pillow or sit on the toilet for long periods. This increases blood pooling and pain.   Move your bowels when your body has the urge; this will require less straining and will decrease pain and pressure.

## 2013-11-21 NOTE — H&P (Signed)
Primary Care Physician:  Rubbie Battiest, MD Primary Gastroenterologist:  Dr. Oneida Alar  Pre-Procedure History & Physical: HPI:  Michael [name] is a 58 y.o. male here for COLON CANCER SCREENING.   Past Medical History  Diagnosis Date  . Coronary atherosclerosis of artery bypass graft   . Other and unspecified hyperlipidemia     takes Simvastatin daily  . Gout     takes Allopurinol daily  . Allergy   . CAD (coronary artery disease)   . Peripheral vascular disease   . Malignant hyperthermia     Potential but not Confirmed  . Essential hypertension, benign     Takes Amlodipine and Losartan-HCTZ daily;takes Coreg daily  . Anginal pain     last time a yr ago  . Back pain   . Neck pain     yrs ago and saw a chiropractor occasionally  . GERD (gastroesophageal reflux disease)     was on Prilosec;is not currently on any meds  . Urinary urgency   . Bruises easily     Past Surgical History  Procedure Laterality Date  . Ankle fracture surgery Left 1981  . Colonoscopy    . Inguinal hernia repair  Aug. 2005    bilateral inguinal  . Coronary artery bypass graft  1999    x 4  . Fracture surgery  1980    screws placed and in 1981 screws removed(this is when was told about potential for MH  . Esophagogastroduodenoscopy    . Cystoscopy    . Endarterectomy Left 04/29/2012    Procedure: ENDARTERECTOMY CAROTID;  Surgeon: Rosetta Posner, MD;  Location: [facility];  Service: Vascular;  Laterality: Left;  with primary closure of artery  . Carotid endarterectomy Left 04-29-12    cea  . Cardiac catheterization  2015    Prior to Admission medications   Medication Sig Start Date End Date Taking? Authorizing Provider  allopurinol (ZYLOPRIM) 300 MG tablet Take 1 tablet (300 mg total) by mouth daily. 12/06/12  Yes Dorothy Spark, MD  amLODipine (NORVASC) 10 MG tablet Take 10 mg by mouth at bedtime.   Yes Historical Provider, MD  aspirin 325 MG tablet Take 325 mg by mouth daily.   Yes Historical Provider, MD   carvedilol (COREG) 12.5 MG tablet Take 1 tablet (12.5 mg total) by mouth 2 (two) times daily. 12/20/12  Yes Dorothy Spark, MD  hydrochlorothiazide (HYDRODIURIL) 25 MG tablet Take 1 tablet (25 mg total) by mouth daily. 11/02/13  Yes Dorothy Spark, MD  Multiple Vitamin (MULTI-VITAMIN DAILY PO) Take 1 tablet by mouth daily.    Yes Historical Provider, MD  simvastatin (ZOCOR) 20 MG tablet Take 20 mg by mouth daily.   Yes Historical Provider, MD  Sod Picosulfate-Mag Ox-Cit Acd 10-3.5-12 MG-GM-GM PACK Take 1 Container by mouth as directed. 11/05/13  Yes Danie Binder, MD  indomethacin (INDOCIN) 25 MG capsule Take 25 mg by mouth 2 (two) times daily as needed (Gout pain).    Historical Provider, MD  SALINE NASAL MIST NA Place 1 spray into both nostrils daily as needed (allergies).    Historical Provider, MD    Allergies as of 11/03/2013 - Review Complete 11/03/2013  Allergen Reaction Noted  . Anesthetics, amide Anaphylaxis 03/22/2012  . Ramipril Cough     Family History  Problem Relation Age of Onset  . Kidney failure Mother   . Cancer Mother   . Arthritis Mother   . Kidney disease Mother   . Hypertension  Father   . Healthy Father   . CVA Brother 34  . Hypertension Brother     History   Social History  . Marital Status: Divorced    Spouse Name: N/A    Number of Children: N/A  . Years of Education: N/A   Occupational History  . CONSTRUCTION/MAINT     [employer]   Social History Main Topics  . Smoking status: Former Smoker -- 1.00 packs/day for 20 years    Types: Cigarettes    Quit date: 05/14/1996  . Smokeless tobacco: Never Used     Comment: quit 1995  . Alcohol Use: 3.6 oz/week    6 Cans of beer per week  . Drug Use: No  . Sexual Activity: No   Other Topics Concern  . Not on file   Social History Narrative    Review of Systems: See HPI, otherwise negative ROS   Physical Exam: BP 168/98 mmHg  Temp(Src) 97.5 F (36.4 C) (Oral)  Resp 22  Ht 5'  9.5" (1.765 m)  Wt 223 lb (101.152 kg)  BMI 32.47 kg/m2  SpO2 97% General:   Alert,  pleasant and cooperative in NAD Head:  Normocephalic and atraumatic. Neck:  Supple; Lungs:  Clear throughout to auscultation.    Heart:  Regular rate and rhythm. Abdomen:  Soft, nontender and nondistended. Normal bowel sounds, without guarding, and without rebound.   Neurologic:  Alert and  oriented x4;  grossly normal neurologically.  Impression/Plan:     SCREENING  Plan:  1. TCS TODAY

## 2013-11-22 NOTE — Op Note (Addendum)
[facility] [address] [place], [address]   COLONOSCOPY PROCEDURE REPORT  PATIENT: Michael [name], Michael [name]  MR#: [id number] BIRTHDATE: 1955-08-19 , 36  yrs. old GENDER: male ENDOSCOPIST: Barney Drain, MD REFERRED IF:OYDXAJO Wolfgang Phoenix, M.D. PROCEDURE DATE:  11/21/2013 PROCEDURE:   Colonoscopy with snare polypectomy and Colonoscopy with cold biopsy polypectomy INDICATIONS:average risk for colon cancer. MEDICATIONS: Demerol 100 mg IV and Versed 6 mg IV  DESCRIPTION OF PROCEDURE:    Physical exam was performed.  Informed consent was obtained from the patient after explaining the benefits, risks, and alternatives to procedure.  The patient was connected to monitor and placed in left lateral position. Continuous oxygen was provided by nasal cannula and IV medicine administered through an indwelling cannula.  After administration of sedation and rectal exam, the patients rectum was intubated and the EC-[id number] ([id number])  colonoscope was advanced under direct visualization to the cecum.  The scope was removed slowly by carefully examining the color, texture, anatomy, and integrity mucosa on the way out.  The patient was recovered in endoscopy and discharged home in satisfactory condition.    COLON FINDINGS: Two sessile polyps ranging from 2 to 59mm in size were found in the rectum and sigmoid colon.  A polypectomy was performed with cold forceps.  , A sessile polyp measuring 6 mm in size was found in the rectum.  A polypectomy was performed using snare cautery.  , There was moderate diverticulosis noted in the sigmoid colon and descending colon with associated tortuosity and muscular hypertrophy.  , The colon was redundant.  Manual abdominal counter-pressure was used to reach the cecum, and Small internal hemorrhoids were found.  PREP QUALITY: good.CECAL W/D TIME: 18 MINS          COMPLICATIONS: None  ENDOSCOPIC IMPRESSION: 1.   The left colon is  redundant 2.   Small  internal hemorrhoids 3.   Moderate diverticulosis noted in the sigmoid colon and descending colon 4.   THREE COLON polyps removedre cautery  RECOMMENDATIONS: AWAIT BIOPSY HIGH FIBER DIET TCS IN 5-10 YRS.  CONSIDER OVERTUBE.    _______________________________ eSignedBarney Drain, MD 2013-12-02 6:00 AM Revised: 12-02-13 6:00 AM   CPT CODES: ICD CODES:  The ICD and CPT codes recommended by this software are interpretations from the data that the clinical staff has captured with the software.  The verification of the translation of this report to the ICD and CPT codes and modifiers is the sole responsibility of the health care institution and practicing physician where this report was generated.  [employer] will not be held responsible for the validity of the ICD and CPT codes included on this report.  AMA assumes no liability for data contained or not contained herein. CPT is a Designer, television/film set of the [employer].

## 2013-11-26 ENCOUNTER — Encounter (HOSPITAL_COMMUNITY): Payer: Self-pay | Admitting: Gastroenterology

## 2013-12-08 ENCOUNTER — Other Ambulatory Visit: Payer: Self-pay | Admitting: Cardiology

## 2013-12-11 ENCOUNTER — Encounter (HOSPITAL_COMMUNITY): Payer: Self-pay | Admitting: Cardiology

## 2013-12-31 ENCOUNTER — Telehealth: Payer: Self-pay | Admitting: Gastroenterology

## 2013-12-31 NOTE — Telephone Encounter (Signed)
Please call pt. HE had BENIGN polypoid lesion removed BUT ONE POLYP WAS NOT RETRIEVED. FOLLOW A High fiber diet. TCS in 5 years WITH AN OVTERUBE.

## 2013-12-31 NOTE — Telephone Encounter (Signed)
Pt is aware of results. 

## 2014-01-01 NOTE — Telephone Encounter (Signed)
Reminder in epic °

## 2014-01-15 ENCOUNTER — Encounter: Payer: Self-pay | Admitting: Cardiology

## 2014-01-15 NOTE — Telephone Encounter (Signed)
Informed the pt through mychart that per Dr Meda Coffee she did not order for you to have fasting labs tomorrow, but its always a good idea to come fasting to early morning appointments fasting, in case she wants to order fasting labs at the visit.  Informed the pt that since his appointment is scheduled early in the morning, I would come fasting, but you are not required to.

## 2014-01-16 ENCOUNTER — Encounter: Payer: Self-pay | Admitting: Cardiology

## 2014-01-16 ENCOUNTER — Ambulatory Visit (INDEPENDENT_AMBULATORY_CARE_PROVIDER_SITE_OTHER): Payer: 59 | Admitting: Cardiology

## 2014-01-16 VITALS — BP 146/88 | HR 77 | Ht 70.0 in | Wt 230.1 lb

## 2014-01-16 DIAGNOSIS — I739 Peripheral vascular disease, unspecified: Secondary | ICD-10-CM

## 2014-01-16 DIAGNOSIS — I10 Essential (primary) hypertension: Secondary | ICD-10-CM

## 2014-01-16 DIAGNOSIS — E785 Hyperlipidemia, unspecified: Secondary | ICD-10-CM

## 2014-01-16 DIAGNOSIS — I25709 Atherosclerosis of coronary artery bypass graft(s), unspecified, with unspecified angina pectoris: Secondary | ICD-10-CM

## 2014-01-16 MED ORDER — CARVEDILOL 25 MG PO TABS: 25.0000 mg | ORAL_TABLET | Freq: Two times a day (BID) | ORAL | Status: DC

## 2014-01-16 NOTE — Progress Notes (Signed)
Patient ID: Michael [name], male   DOB: [birth year], 59 y.o.   MRN: [id number] Patient ID: Michael [name], male   DOB: [birth year], 59 y.o.   MRN: [id number]    Patient Name: Michael [name] Date of Encounter: 01/16/2014  Primary Care Provider:  Rubbie Battiest, MD Primary Cardiologist:  Dorothy Spark   Problem List   Past Medical History  Diagnosis Date  . Coronary atherosclerosis of artery bypass graft   . Other and unspecified hyperlipidemia     takes Simvastatin daily  . Gout     takes Allopurinol daily  . Allergy   . CAD (coronary artery disease)   . Peripheral vascular disease   . Malignant hyperthermia     Potential but not Confirmed  . Essential hypertension, benign     Takes Amlodipine and Losartan-HCTZ daily;takes Coreg daily  . Anginal pain     last time a yr ago  . Back pain   . Neck pain     yrs ago and saw a chiropractor occasionally  . GERD (gastroesophageal reflux disease)     was on Prilosec;is not currently on any meds  . Urinary urgency   . Bruises easily    Past Surgical History  Procedure Laterality Date  . Ankle fracture surgery Left 1981  . Colonoscopy    . Inguinal hernia repair  Aug. 2005    bilateral inguinal  . Coronary artery bypass graft  1999    x 4  . Fracture surgery  1980    screws placed and in 1981 screws removed(this is when was told about potential for MH  . Esophagogastroduodenoscopy    . Cystoscopy    . Endarterectomy Left 04/29/2012    Procedure: ENDARTERECTOMY CAROTID;  Surgeon: Rosetta Posner, MD;  Location: [facility];  Service: Vascular;  Laterality: Left;  with primary closure of artery  . Carotid endarterectomy Left 04-29-12    cea  . Cardiac catheterization  2015  . Colonoscopy N/A 11/21/2013    Procedure: COLONOSCOPY;  Surgeon: Danie Binder, MD;  Location: [facility];  Service: Endoscopy;  Laterality: N/A;  1:15 PM  . Left heart catheterization with coronary/graft angiogram N/A 06/10/2013    Procedure: LEFT HEART CATHETERIZATION  WITH Beatrix Fetters;  Surgeon: Peter M Martinique, MD;  Location: [facility] C[facility];  Service: Cardiovascular;  Laterality: N/A;    Allergies  Allergies  Allergen Reactions  . Anesthetics, Amide Anaphylaxis  . Ramipril Cough  . Losartan     HPI  Mr Michael [name] is a former patient of Dr Verl Blalock. He has h/o CAD, CABG in 1999, PAD, s/p left carotid endarterectomy in April 2014, hypertension and hyperlipidemia. This is his 1 year follow up. He denies any chest pain and feels SOB on strenuous exercise. He underwent carotid endarterectomy for symptoms of dizziness and neck tingling that has resolved partially. He still feels dizzy sometimes. He denies palpitations, orthopnea, PND or syncope. He has gained weight, the last year he started to exercise daily, but with symptoms of dizziness and recent surgery he stopped. He is motivated to do so. He states that sometimes he develops feet edema.  The patient was scheduled for 1 year follow up but is coming after 6 months and is reporting dizziness especially after he stands up but can occur while walking for a while as well. No syncope. He is also experiencing retrosternal tightness that is not exertional, the last one a week ago that persisted for hours. No obvious  exacerbating or alleviating factors. No palpitations, no LE edema, orthopnea or PND.   01/16/14 - the patient is coming after 6 months, he underwent left cardiac cath in June 2015 for abnormal stress test. The cath showed severe native 3VD and all bypass vessels patent.   He denies CP, SOB, has some dizziness, no palpitations or syncope. No muscle pain, no claudications. He is currently not exercising but bought a stationary bike that he is planning to use.    Home Medications  Current outpatient prescriptions:  .  allopurinol (ZYLOPRIM) 300 MG tablet, TAKE 1 TABLET DAILY, Disp: 90 tablet, Rfl: 2 .  amLODipine (NORVASC) 10 MG tablet, TAKE 1 TABLET DAILY, Disp: 90 tablet, Rfl: 1 .  aspirin 325  MG tablet, Take 325 mg by mouth daily., Disp: , Rfl:  .  carvedilol (COREG) 12.5 MG tablet, TAKE 1 TABLET TWICE A DAY, Disp: 180 tablet, Rfl: 2 .  hydrochlorothiazide (HYDRODIURIL) 25 MG tablet, Take 1 tablet (25 mg total) by mouth daily., Disp: 90 tablet, Rfl: 3 .  indomethacin (INDOCIN) 25 MG capsule, Take 25 mg by mouth 2 (two) times daily as needed (Gout pain)., Disp: , Rfl:  .  Multiple Vitamin (MULTI-VITAMIN DAILY PO), Take 1 tablet by mouth daily. , Disp: , Rfl:  .  SALINE NASAL MIST NA, Place 1 spray into both nostrils daily as needed (allergies)., Disp: , Rfl:  .  simvastatin (ZOCOR) 20 MG tablet, TAKE 1 TABLET AT BEDTIME, Disp: 90 tablet, Rfl: 1  Family History  Family History  Problem Relation Age of Onset  . Kidney failure Mother   . Cancer Mother   . Arthritis Mother   . Kidney disease Mother   . Hypertension Father   . Healthy Father   . CVA Brother 75  . Hypertension Brother     Social History  History   Social History  . Marital Status: Divorced    Spouse Name: N/A    Number of Children: N/A  . Years of Education: N/A   Occupational History  . CONSTRUCTION/MAINT     [employer]   Social History Main Topics  . Smoking status: Former Smoker -- 1.00 packs/day for 20 years    Types: Cigarettes    Quit date: 05/14/1996  . Smokeless tobacco: Never Used     Comment: quit 1995  . Alcohol Use: 3.6 oz/week    6 Cans of beer per week  . Drug Use: No  . Sexual Activity: No   Other Topics Concern  . Not on file   Social History Narrative    Review of Systems, as per HPI, otherwise negative General:  No chills, fever, night sweats or weight changes.  Cardiovascular:  No chest pain, dyspnea on exertion, edema, orthopnea, palpitations, paroxysmal nocturnal dyspnea. Dermatological: No rash, lesions/masses Respiratory: No cough, dyspnea Urologic: No hematuria, dysuria Abdominal:   No nausea, vomiting, diarrhea, bright red blood per rectum, melena, or  hematemesis Neurologic:  No visual changes, wkns, changes in mental status. All other systems reviewed and are otherwise negative except as noted above.  Physical Exam  Blood pressure 146/88, pulse 77, height 5\' 10"  (1.778 m), weight 230 lb 1.9 oz (104.382 kg).  Orthostatics negative. General: Pleasant, NAD Psych: Normal affect. Neuro: Alert and oriented X 3. Moves all extremities spontaneously. HEENT: Normal  Neck: Supple without bruits or JVD. Lungs:  Resp regular and unlabored, CTA. Heart: RRR no s3, s4, or murmurs. Abdomen: Soft, non-tender, non-distended, BS + x 4.  Extremities: No clubbing, cyanosis or edema. DP/PT/Radials 2+ and equal bilaterally.  Labs:  No results for input(s): CKTOTAL, CKMB, TROPONINI in the last 72 hours. Lab Results  Component Value Date   WBC 6.6 06/06/2013   HGB 13.4 06/06/2013   HCT 39.3 06/06/2013   MCV 92.8 06/06/2013   PLT 225.0 06/06/2013   No results for input(s): NA, K, CL, CO2, BUN, CREATININE, CALCIUM, PROT, BILITOT, ALKPHOS, ALT, AST, GLUCOSE in the last 168 hours.  Invalid input(s): LABALBU Lab Results  Component Value Date   CHOL 144 10/08/2013   HDL 49 10/08/2013   LDLCALC 68 10/08/2013   TRIG 134 10/08/2013   No results found for: DDIMER Invalid input(s): POCBNP  Accessory Clinical Findings  Echocardiogram 01/04/2012 Study Conclusions  - Left ventricle: The cavity size was normal. Wall thickness was increased in a pattern of mild LVH. Systolic function was normal. The estimated ejection fraction was in the range of 60% to 65%. Wall motion was normal; there were no regional wall motion abnormalities. Left ventricular diastolic function parameters were normal.  ECG - normal sinus rhythm, 83 beats per minute, nonspecific T wave abnormality.  Exercise nuclear stress test 12/12/2013 Exercise Capacity: Good exercise capacity. BP Response: Baseline HTN with Hypertensive blood pressure  response. DBP increased from 85 - 117  mmHg; SBP 157 - 193 mmHg Clinical Symptoms: The exercise was limited by dyspnea &  fatigue. No anginal pain.. ECG Impression: Significant ST abnormalities consistent with  ischemia. Comparison with Prior Nuclear Study: No images to compare LV Wall Motion: NL LV Function; NL Wall Motion  Overall Impression: Low to Intermediate risk stress nuclear  study Abnormal ECG portion of the Exercise Myoview with notable  ST depression concerning for ischemia along with PVCs in singlets & couplet that were more prominent . There is no Scintigraphic  finding to suggest ischemia, but the presence of significant  splanchnic attenuation makes the study less interpretable.  ECG: SR, negative T waves in the inferolateral leads, new when compared to te ECG on 12/06/2012  Cardiac cath 06/10/2013 Left ventriculography: Left ventricular systolic function is normal, LVEF is estimated at 55-65%, there is no significant mitral regurgitation   Final Conclusions:  1. Severe 3 vessel obstructive CAD 2. All grafts are patent including SVG to diagonal 2, SVG to OM 2, RIMA to RCA, and LIMA to LAD.  3. Normal LV function.   Recommendations: Recommend continued medical therapy. His grafts are functioning very well. The PDA is occluded but fills by collaterals. The LCx in the AV groove has severe disease but does not supply much myocardium.   Peter M Martinique, [place]   Assessment & Plan  A very pleasant 59 year old male   84. CAD, s/p CABG in 1999, - the patient is presenting with exertional dizziness and atypical nonexertional chest pain. There are new inferolateral negative T waves on his EKG, however some were present on the prior EKGs but were more likely nonspecific changes and now they are much more pronounced. The patient has a history of prior coronary artery bypass grafting 16 years ago, left cardiac cath in 06/2013 that showed all grafts are patent including SVG to diagonal 2, SVG to OM 2, RIMA to RCA, and  LIMA to LAD.  We will contiue aggressive medical management with stress on proper diet, exercise (educated about type and frequency) and BP management.    2. PAD, s/p carotid endarterectomy, stable on duplex carotids on 12/03/12, just followed with vascular surgery and  had stable post-endarterectomy findings on the left and 40-59% stenosis on the R ICA.  3. HTN - uncontrolled on current regimen, angioedema post ACEI and ARB, increase carvedilol to 25 mg po BID.   4. Hyperlipidemia - at goal, repeat in 1 year  Follow up in 1 year.   Dorothy Spark, MD, [facility] 01/16/2014, 8:22 AM

## 2014-01-16 NOTE — Patient Instructions (Signed)
Your physician has recommended you make the following change in your medication:   START TAKING CARVEDILOL 25 MG TWICE DAILY    Your physician recommends that you return for lab work in: [place] ( CMET AND LIPIDS) --PLEASE COME FASTING FOR THIS APPOINTMENT   Your physician wants you to follow-up in: [place] will receive a reminder letter in the mail two months in advance. If you don't receive a letter, please call our office to schedule the follow-up appointment.

## 2014-05-03 ENCOUNTER — Encounter: Payer: Self-pay | Admitting: Cardiology

## 2014-05-04 ENCOUNTER — Other Ambulatory Visit: Payer: Self-pay | Admitting: *Deleted

## 2014-05-04 ENCOUNTER — Telehealth: Payer: Self-pay | Admitting: *Deleted

## 2014-05-04 MED ORDER — SIMVASTATIN 20 MG PO TABS: 20.0000 mg | ORAL_TABLET | Freq: Every day | ORAL | Status: DC

## 2014-05-04 NOTE — Telephone Encounter (Signed)
Refill of Simvastatin completed.

## 2014-05-04 NOTE — Telephone Encounter (Signed)
Opened to complete Refill status from Print to E-order.

## 2014-05-04 NOTE — Telephone Encounter (Signed)
Refill rx printed out, so reordering so that it will go electronically via system.

## 2014-05-04 NOTE — Telephone Encounter (Signed)
Opened to confirm refill completed.

## 2014-05-11 ENCOUNTER — Encounter: Payer: Self-pay | Admitting: Family Medicine

## 2014-05-11 ENCOUNTER — Ambulatory Visit (INDEPENDENT_AMBULATORY_CARE_PROVIDER_SITE_OTHER): Payer: 59 | Admitting: Family Medicine

## 2014-05-11 VITALS — BP 130/82 | Temp 98.5°F | Ht 69.5 in | Wt 227.2 lb

## 2014-05-11 DIAGNOSIS — M546 Pain in thoracic spine: Secondary | ICD-10-CM | POA: Diagnosis not present

## 2014-05-11 DIAGNOSIS — T783XXA Angioneurotic edema, initial encounter: Secondary | ICD-10-CM

## 2014-05-11 DIAGNOSIS — I25709 Atherosclerosis of coronary artery bypass graft(s), unspecified, with unspecified angina pectoris: Secondary | ICD-10-CM | POA: Diagnosis not present

## 2014-05-11 MED ORDER — CHLORZOXAZONE 500 MG PO TABS: 500.0000 mg | ORAL_TABLET | Freq: Three times a day (TID) | ORAL | Status: DC | PRN

## 2014-05-11 MED ORDER — HYDROCODONE-ACETAMINOPHEN 5-325 MG PO TABS: 1.0000 | ORAL_TABLET | Freq: Four times a day (QID) | ORAL | Status: DC | PRN

## 2014-05-11 MED ORDER — PREDNISONE 20 MG PO TABS: ORAL_TABLET | ORAL | Status: DC

## 2014-05-11 NOTE — Progress Notes (Signed)
   Subjective:    Patient ID: Michael [name], male    DOB: 30-Aug-1955, 59 y.o.   MRN: [id number] Patient arrives office with several concerns Back Pain This is a new problem. The current episode started in the past 7 days. The problem occurs constantly. The problem is unchanged. The pain is present in the thoracic spine. The quality of the pain is described as aching. Radiates to: left arm  The pain is moderate. The pain is the same all the time. Stiffness is present all day. He has tried NSAIDs for the symptoms. The treatment provided no relief.   Patient states that he wants to follow up with his allergy issue while he is here also.   Pain mid back and spine and tend er and worse with motion  Trouble finding particular comfortable position    Doing a lot physically, but recalls no partic injury  Allergy having ongoingchallenges. See prior notes. We were wondering if this was related to losartan. This was change. Patient continues to experience impressive hive-like eruptions. Sometimes occurring mouth and throat. Affect speech and breathing.   Known history of coronary artery disease. Patient very anxious because the pain has radiated to left arm. Wonders if he needs to get back to his cardiologist. No true chest pain no nausea no diaphoresis.  Pain does radiate into the left arm.  Definitely worse with certain motions were suddenly grabs hard. Using over-the-counter anti-inflammatories with minimal success.  Review of Systems  Musculoskeletal: Positive for back pain.   no headache no chest pain no abdominal pain no change in bowel habits     Objective:   Physical Exam  Alert vitals stable mild malaise HET normal neck supple lungs clear heart rare rhythm positive paraspinal tenderness left greater than right midthoracic region definite sudden pain with motion and range of motion left shoulder. Chest wall nontender.   Left medial arm distinctly urticarial patch itchy just came up  within recent minutes      Assessment & Plan:  Impression 1 back pain musculoskeletal likely muscle and ligaments discussed her 2 coronary artery disease with patient concern regarding symptoms. Also wondering if he may have a pinched nerve. #3 intermittent urticaria time for referral discussed plan allergy referral. Anti-inflammatory medication via prednisone. Muscle spasms medicine. Pain medicine. Work excuse written local measures discussed WSL

## 2014-05-22 ENCOUNTER — Encounter (HOSPITAL_COMMUNITY): Payer: Self-pay | Admitting: Emergency Medicine

## 2014-05-22 ENCOUNTER — Emergency Department (HOSPITAL_COMMUNITY)
Admission: EM | Admit: 2014-05-22 | Discharge: 2014-05-22 | Disposition: A | Discharge status: Home / Self Care | Payer: 59 | Attending: Emergency Medicine | Admitting: Emergency Medicine

## 2014-05-22 DIAGNOSIS — M109 Gout, unspecified: Secondary | ICD-10-CM | POA: Insufficient documentation

## 2014-05-22 DIAGNOSIS — Z9889 Other specified postprocedural states: Secondary | ICD-10-CM | POA: Diagnosis not present

## 2014-05-22 DIAGNOSIS — I25119 Atherosclerotic heart disease of native coronary artery with unspecified angina pectoris: Secondary | ICD-10-CM | POA: Diagnosis not present

## 2014-05-22 DIAGNOSIS — T783XXA Angioneurotic edema, initial encounter: Secondary | ICD-10-CM | POA: Diagnosis not present

## 2014-05-22 DIAGNOSIS — I10 Essential (primary) hypertension: Secondary | ICD-10-CM | POA: Diagnosis not present

## 2014-05-22 DIAGNOSIS — Z87891 Personal history of nicotine dependence: Secondary | ICD-10-CM | POA: Insufficient documentation

## 2014-05-22 DIAGNOSIS — Z951 Presence of aortocoronary bypass graft: Secondary | ICD-10-CM | POA: Insufficient documentation

## 2014-05-22 DIAGNOSIS — Z79899 Other long term (current) drug therapy: Secondary | ICD-10-CM | POA: Insufficient documentation

## 2014-05-22 DIAGNOSIS — Z7982 Long term (current) use of aspirin: Secondary | ICD-10-CM | POA: Insufficient documentation

## 2014-05-22 DIAGNOSIS — E785 Hyperlipidemia, unspecified: Secondary | ICD-10-CM | POA: Insufficient documentation

## 2014-05-22 DIAGNOSIS — K219 Gastro-esophageal reflux disease without esophagitis: Secondary | ICD-10-CM | POA: Insufficient documentation

## 2014-05-22 DIAGNOSIS — R220 Localized swelling, mass and lump, head: Secondary | ICD-10-CM | POA: Diagnosis present

## 2014-05-22 LAB — CBC WITH DIFFERENTIAL/PLATELET
BASOS ABS: 0 10*3/uL (ref 0.0–0.1)
BASOS PCT: 0 % (ref 0–1)
Eosinophils Absolute: 0.2 10*3/uL (ref 0.0–0.7)
Eosinophils Relative: 3 % (ref 0–5)
HEMATOCRIT: 43.1 % (ref 39.0–52.0)
Hemoglobin: 14.7 g/dL (ref 13.0–17.0)
LYMPHS ABS: 1.4 10*3/uL (ref 0.7–4.0)
LYMPHS PCT: 18 % (ref 12–46)
MCH: 31.3 pg (ref 26.0–34.0)
MCHC: 34.1 g/dL (ref 30.0–36.0)
MCV: 91.9 fL (ref 78.0–100.0)
MONOS PCT: 12 % (ref 3–12)
Monocytes Absolute: 0.9 10*3/uL (ref 0.1–1.0)
Neutro Abs: 5.1 10*3/uL (ref 1.7–7.7)
Neutrophils Relative %: 67 % (ref 43–77)
Platelets: 204 10*3/uL (ref 150–400)
RBC: 4.69 MIL/uL (ref 4.22–5.81)
RDW: 13.2 % (ref 11.5–15.5)
WBC: 7.6 10*3/uL (ref 4.0–10.5)

## 2014-05-22 LAB — BASIC METABOLIC PANEL
Anion gap: 9 (ref 5–15)
BUN: 16 mg/dL (ref 6–20)
CO2: 29 mmol/L (ref 22–32)
Calcium: 9 mg/dL (ref 8.9–10.3)
Chloride: 99 mmol/L — ABNORMAL LOW (ref 101–111)
Creatinine, Ser: 1.06 mg/dL (ref 0.61–1.24)
GFR calc non Af Amer: >60 mL/min (ref >60)
Glucose, Bld: 104 mg/dL — ABNORMAL HIGH (ref 65–99)
POTASSIUM: 3.6 mmol/L (ref 3.5–5.1)
Sodium: 137 mmol/L (ref 135–145)

## 2014-05-22 MED ORDER — PREDNISONE 10 MG PO TABS: 40.0000 mg | ORAL_TABLET | Freq: Every day | ORAL | Status: DC

## 2014-05-22 MED ORDER — FAMOTIDINE IN NACL 20-0.9 MG/50ML-% IV SOLN
20.0000 mg | Freq: Once | INTRAVENOUS | Status: AC
  Administered 2014-05-22: 20 mg via INTRAVENOUS
  Filled 2014-05-22: qty 50

## 2014-05-22 MED ORDER — SODIUM CHLORIDE 0.9 % IV BOLUS (SEPSIS)
250.0000 mL | Freq: Once | INTRAVENOUS | Status: AC
  Administered 2014-05-22: 250 mL via INTRAVENOUS

## 2014-05-22 MED ORDER — DIPHENHYDRAMINE HCL 50 MG/ML IJ SOLN
25.0000 mg | Freq: Once | INTRAMUSCULAR | Status: AC
  Administered 2014-05-22: 25 mg via INTRAVENOUS
  Filled 2014-05-22: qty 1

## 2014-05-22 MED ORDER — FAMOTIDINE 20 MG PO TABS: 20.0000 mg | ORAL_TABLET | Freq: Two times a day (BID) | ORAL | Status: DC

## 2014-05-22 MED ORDER — METHYLPREDNISOLONE SODIUM SUCC 125 MG IJ SOLR
125.0000 mg | Freq: Once | INTRAMUSCULAR | Status: AC
  Administered 2014-05-22: 125 mg via INTRAVENOUS
  Filled 2014-05-22: qty 2

## 2014-05-22 MED ORDER — DIPHENHYDRAMINE HCL 25 MG PO TABS: 25.0000 mg | ORAL_TABLET | Freq: Four times a day (QID) | ORAL | Status: DC

## 2014-05-22 MED ORDER — SODIUM CHLORIDE 0.9 % IV SOLN
INTRAVENOUS | Status: DC
  Administered 2014-05-22: 16:00:00 via INTRAVENOUS

## 2014-05-22 NOTE — ED Notes (Signed)
ED Provider at patient bedside for assessment/update.   

## 2014-05-22 NOTE — ED Provider Notes (Addendum)
CSN: [id number]     Arrival date & time 05/22/14  1435 History   First MD Initiated Contact with Patient 05/22/14 1439     Chief Complaint  Patient presents with  . Angioedema     (Consider location/radiation/quality/duration/timing/severity/associated sxs/prior Treatment) The history is provided by the patient.   59 year old male with history of angioedema in the past. Had some swelling starting yesterday predominantly to the lower lip and a little bit the tongue. Got worse today. Patient referred in by primary care office. Patient not on any ACE inhibitor's. The exact cause of this has not been clear. His primary care doctors are working on referral for additional allergy workup. In the past Benadryl has helped this. Patient is alert but has lots of slurred speech due to the marked tongue swelling. No lip swelling. Also has complaint of a rash to the left eczema and upper arm area. This is typical to what happened in the past. Otherwise patient had been feeling fine earlier in the day. There is no known food allergies or environmental allergies. Patient was not stung or bitten by anything recently. No new medications.  Past Medical History  Diagnosis Date  . Coronary atherosclerosis of artery bypass graft   . Other and unspecified hyperlipidemia     takes Simvastatin daily  . Gout     takes Allopurinol daily  . Allergy   . CAD (coronary artery disease)   . Peripheral vascular disease   . Malignant hyperthermia     Potential but not Confirmed  . Essential hypertension, benign     Takes Amlodipine and Losartan-HCTZ daily;takes Coreg daily  . Anginal pain     last time a yr ago  . Back pain   . Neck pain     yrs ago and saw a chiropractor occasionally  . GERD (gastroesophageal reflux disease)     was on Prilosec;is not currently on any meds  . Urinary urgency   . Bruises easily    Past Surgical History  Procedure Laterality Date  . Ankle fracture surgery Left 1981  .  Colonoscopy    . Inguinal hernia repair  Aug. 2005    bilateral inguinal  . Coronary artery bypass graft  1999    x 4  . Fracture surgery  1980    screws placed and in 1981 screws removed(this is when was told about potential for MH  . Esophagogastroduodenoscopy    . Cystoscopy    . Endarterectomy Left 04/29/2012    Procedure: ENDARTERECTOMY CAROTID;  Surgeon: Rosetta Posner, MD;  Location: [place];  Service: Vascular;  Laterality: Left;  with primary closure of artery  . Carotid endarterectomy Left 04-29-12    cea  . Cardiac catheterization  2015  . Colonoscopy N/A 11/21/2013    Procedure: COLONOSCOPY;  Surgeon: Danie Binder, MD;  [facility];  Service: Endoscopy;  Laterality: N/A;  1:15 PM  . Left heart catheterization with coronary/graft angiogram N/A 06/10/2013    Procedure: LEFT HEART CATHETERIZATION WITH Beatrix Fetters;  Surgeon: Peter M Martinique, MD;  Location: [facility] CATH LAB;  Service: Cardiovascular;  Laterality: N/A;   Family History  Problem Relation Age of Onset  . Kidney failure Mother   . Cancer Mother   . Arthritis Mother   . Kidney disease Mother   . Hypertension Father   . Healthy Father   . CVA Brother 10  . Hypertension Brother    History  Substance Use Topics  . Smoking  status: Former Smoker -- 1.00 packs/day for 20 years    Types: Cigarettes    Quit date: 05/14/1996  . Smokeless tobacco: Never Used     Comment: quit 1995  . Alcohol Use: 3.6 oz/week    6 Cans of beer per week    Review of Systems  Constitutional: Negative for fever.  HENT: Positive for facial swelling and trouble swallowing. Negative for congestion.   Eyes: Negative for visual disturbance.  Respiratory: Negative for shortness of breath.   Cardiovascular: Negative for chest pain.  Gastrointestinal: Negative for nausea, vomiting and abdominal pain.  Genitourinary: Negative for dysuria.  Musculoskeletal: Negative for back pain.  Skin: Positive for rash.   Allergic/Immunologic: Negative for environmental allergies and food allergies.  Neurological: Negative for headaches.  Hematological: Does not bruise/bleed easily.  Psychiatric/Behavioral: Negative for confusion.      Allergies  Anesthetics, amide; Ramipril; and Losartan  Home Medications   Prior to Admission medications   Medication Sig Start Date End Date Taking? Authorizing Provider  allopurinol (ZYLOPRIM) 300 MG tablet TAKE 1 TABLET DAILY 12/09/13  Yes Dorothy Spark, MD  amLODipine (NORVASC) 10 MG tablet TAKE 1 TABLET DAILY 12/09/13  Yes Dorothy Spark, MD  aspirin 325 MG tablet Take 325 mg by mouth daily.   Yes Historical Provider, MD  carvedilol (COREG) 25 MG tablet Take 1 tablet (25 mg total) by mouth 2 (two) times daily with a meal. 01/16/14  Yes Dorothy Spark, MD  chlorzoxazone (PARAFON FORTE DSC) 500 MG tablet Take 1 tablet (500 mg total) by mouth 3 (three) times daily as needed for muscle spasms. 05/11/14  Yes Mikey Kirschner, MD  diphenhydrAMINE (BENADRYL) 25 MG tablet Take 25-50 mg by mouth daily as needed for allergies.   Yes Historical Provider, MD  hydrochlorothiazide (HYDRODIURIL) 25 MG tablet Take 1 tablet (25 mg total) by mouth daily. 11/02/13  Yes Dorothy Spark, MD  HYDROcodone-acetaminophen (NORCO/VICODIN) 5-325 MG per tablet Take 1 tablet by mouth every 6 (six) hours as needed. Patient taking differently: Take 1 tablet by mouth every 6 (six) hours as needed for moderate pain.  05/11/14  Yes Mikey Kirschner, MD  indomethacin (INDOCIN) 25 MG capsule Take 25 mg by mouth 2 (two) times daily as needed (Gout pain).   Yes Historical Provider, MD  Multiple Vitamin (MULTI-VITAMIN DAILY PO) Take 1 tablet by mouth daily.    Yes Historical Provider, MD  SALINE NASAL MIST NA Place 1 spray into both nostrils daily as needed (allergies).   Yes Historical Provider, MD  simvastatin (ZOCOR) 20 MG tablet Take 1 tablet (20 mg total) by mouth at bedtime. Patient taking  differently: Take 20 mg by mouth every morning.  05/04/14  Yes Dorothy Spark, MD  diphenhydrAMINE (BENADRYL) 25 MG tablet Take 1 tablet (25 mg total) by mouth every 6 (six) hours. 05/22/14   Fredia Sorrow, MD  famotidine (PEPCID) 20 MG tablet Take 1 tablet (20 mg total) by mouth 2 (two) times daily. 05/22/14   Fredia Sorrow, MD  predniSONE (DELTASONE) 10 MG tablet Take 4 tablets (40 mg total) by mouth daily. 05/22/14   Fredia Sorrow, MD  predniSONE (DELTASONE) 20 MG tablet 3 tablets a day for 3 days, then 2 tablets a day for 3 days and the one tablet a day for 2 days Patient not taking: Reported on 05/22/2014 05/11/14   Mikey Kirschner, MD   BP 140/94 mmHg  Pulse 85  Temp(Src) 98.1 F (36.7 C) (Oral)  Resp 15  Ht 5\' 10"  (1.778 m)  Wt 225 lb (102.059 kg)  BMI 32.28 kg/m2  SpO2 96% Physical Exam  Constitutional: He is oriented to person, place, and time. He appears well-developed and well-nourished. He appears distressed.  HENT:  Head: Normocephalic and atraumatic.  Were swelling of the tongue. No swelling of the lips. Patient still able to verbalize some but speech is very slurred.  Eyes: Conjunctivae are normal. Pupils are equal, round, and reactive to light.  Neck: Neck supple.  Cardiovascular: Normal rate, regular rhythm and normal heart sounds.   No murmur heard. Pulmonary/Chest: Effort normal and breath sounds normal. No respiratory distress. He has no wheezes. He has no rales.  Abdominal: Soft. Bowel sounds are normal. There is no tenderness.  Musculoskeletal: Normal range of motion. He exhibits no edema.  Neurological: He is alert and oriented to person, place, and time. No cranial nerve deficit. He exhibits normal muscle tone. Coordination normal.  Skin: Skin is warm. Rash noted.  Hive-like rash to the left upper arm and axilla area.  Nursing note and vitals reviewed.   ED Course  Procedures (including critical care time) Labs Review Labs Reviewed  BASIC METABOLIC  PANEL - Abnormal; Notable for the following:    Chloride 99 (*)    Glucose, Bld 104 (*)    All other components within normal limits  CBC WITH DIFFERENTIAL/PLATELET   Results for orders placed or performed during the hospital encounter of 05/22/14  CBC with Differential/Platelet  Result Value Ref Range   WBC 7.6 4.0 - 10.5 K/uL   RBC 4.69 4.22 - 5.81 MIL/uL   Hemoglobin 14.7 13.0 - 17.0 g/dL   HCT 43.1 39.0 - 52.0 %   MCV 91.9 78.0 - 100.0 fL   MCH 31.3 26.0 - 34.0 pg   MCHC 34.1 30.0 - 36.0 g/dL   RDW 13.2 11.5 - 15.5 %   Platelets 204 150 - 400 K/uL   Neutrophils Relative % 67 43 - 77 %   Neutro Abs 5.1 1.7 - 7.7 K/uL   Lymphocytes Relative 18 12 - 46 %   Lymphs Abs 1.4 0.7 - 4.0 K/uL   Monocytes Relative 12 3 - 12 %   Monocytes Absolute 0.9 0.1 - 1.0 K/uL   Eosinophils Relative 3 0 - 5 %   Eosinophils Absolute 0.2 0.0 - 0.7 K/uL   Basophils Relative 0 0 - 1 %   Basophils Absolute 0.0 0.0 - 0.1 K/uL  Basic metabolic panel  Result Value Ref Range   Sodium 137 135 - 145 mmol/L   Potassium 3.6 3.5 - 5.1 mmol/L   Chloride 99 (L) 101 - 111 mmol/L   CO2 29 22 - 32 mmol/L   Glucose, Bld 104 (H) 65 - 99 mg/dL   BUN 16 6 - 20 mg/dL   Creatinine, Ser 1.06 0.61 - 1.24 mg/dL   Calcium 9.0 8.9 - 10.3 mg/dL   GFR calc non Af Amer >60 >60 mL/min   GFR calc Af Amer >60 >60 mL/min   Anion gap 9 5 - 15    Imaging Review No results found.   EKG Interpretation None        CRITICAL CARE Performed by: Fredia Sorrow Total critical care time: 30 Critical care time was exclusive of separately billable procedures and treating other patients. Critical care was necessary to treat or prevent imminent or life-threatening deterioration. Critical care was time spent personally by me on the following activities: development of treatment plan  with patient and/or surrogate as well as nursing, discussions with consultants, evaluation of patient's response to treatment, examination of  patient, obtaining history from patient or surrogate, ordering and performing treatments and interventions, ordering and review of laboratory studies, ordering and review of radiographic studies, pulse oximetry and re-evaluation of patient's condition.  MDM   Final diagnoses:  Angioedema, initial encounter    Patient with a history of recurrent angioedema. This current episode started yesterday, worse today. Referred in from primary care office. Patient's main does symptoms now is swelling of the tongue had a little bit of lower lip swelling yesterday but that resolved. A few hives to the left arm area. Patient still able to talk upon arrival. But there was significant swelling of the tongue.  Patient treated here with soluMedrol Pepcid and Benadryl for approximately 6 hours. Markedly decrease in the tongue swelling. Patient now talking back to normal. No further swelling of the lips. Only minimal tongue swelling still present. Patient will be discharged home on prednisone for 5 days Pepcid for 7 days and Benadryl for the next 2 days.   Fredia Sorrow, MD 05/22/14 1857  Fredia Sorrow, MD 05/22/14 Drema Halon

## 2014-05-22 NOTE — Discharge Instructions (Signed)
Angioedema Angioedema is sudden puffiness (swelling), often of the skin. It can happen:  On your face or privates (genitals).  In your belly (abdomen) or other body parts. It usually happens quickly and gets better in 1 or 2 days. It often starts at night and is found when you wake up. You may get red, itchy patches of skin (hives). Attacks can be dangerous if your breathing passages get puffy. The condition may happen only once, or it can come back at random times. It may happen for several years before it goes away for good. HOME CARE  Only take medicines as told by your doctor.  Always carry your emergency allergy medicines with you.  Wear a medical bracelet as told by your doctor.  Avoid things that you know will cause attacks (triggers). GET HELP IF:  You have another attack.  Your attacks happen more often or get worse.  The condition was passed to you by your parents and you want to have children. GET HELP RIGHT AWAY IF:   Your mouth, tongue, or lips are very puffy.  You have trouble breathing.  You have trouble swallowing.  You pass out (faint). MAKE SURE YOU:   Understand these instructions.  Will watch your condition.  Will get help right away if you are not doing well or get worse. Document Released: 12/07/2008 Document Revised: 10/09/2012 Document Reviewed: 08/12/2012 [facility] Patient Information 2015 [place], [place]. This information is not intended to replace advice given to you by your health care provider. Make sure you discuss any questions you have with your health care provider.   Continue prednisone for the next 5 days. Continue the Pepcid for the next 7 days. Take Benadryl every 6 hours at least for the next 2 days. Return for any new or worse symptoms. Make sure you follow-up with your regular Dr. for additional evaluation of why this is occurring.

## 2014-05-22 NOTE — ED Notes (Addendum)
Patient c/o swelling to tongue. Per patient difficulty swallowing and talking but denies any difficulty with breathing. Significant swelling noted. Per patient has had intermittent angioedema and urticaria for past year. Patient states his lips were swollen yesterday, swelling was gone this morning, and then tongue started swelling this afternoon. Per patient PCP is unsure of what is causing reaction, has changed medications but is now to see a specialist.

## 2014-05-25 ENCOUNTER — Encounter: Payer: Self-pay | Admitting: Family Medicine

## 2014-06-08 ENCOUNTER — Encounter: Payer: Self-pay | Admitting: Family

## 2014-06-09 ENCOUNTER — Encounter: Payer: Self-pay | Admitting: Family

## 2014-06-09 ENCOUNTER — Other Ambulatory Visit (HOSPITAL_COMMUNITY): Payer: 59

## 2014-06-09 ENCOUNTER — Ambulatory Visit: Payer: 59 | Admitting: Family

## 2014-06-10 ENCOUNTER — Encounter: Payer: Self-pay | Admitting: Family

## 2014-06-10 ENCOUNTER — Ambulatory Visit (INDEPENDENT_AMBULATORY_CARE_PROVIDER_SITE_OTHER): Payer: 59 | Admitting: Family

## 2014-06-10 ENCOUNTER — Ambulatory Visit (HOSPITAL_COMMUNITY)
Admission: RE | Admit: 2014-06-10 | Discharge: 2014-06-10 | Disposition: A | Discharge status: Home / Self Care | Payer: 59 | Source: Ambulatory Visit | Attending: Family | Admitting: Family

## 2014-06-10 VITALS — BP 142/88 | HR 67 | Resp 16 | Ht 70.0 in | Wt 226.0 lb

## 2014-06-10 DIAGNOSIS — Z48812 Encounter for surgical aftercare following surgery on the circulatory system: Secondary | ICD-10-CM | POA: Diagnosis not present

## 2014-06-10 DIAGNOSIS — Z87891 Personal history of nicotine dependence: Secondary | ICD-10-CM

## 2014-06-10 DIAGNOSIS — Z9889 Other specified postprocedural states: Secondary | ICD-10-CM

## 2014-06-10 DIAGNOSIS — I6523 Occlusion and stenosis of bilateral carotid arteries: Secondary | ICD-10-CM | POA: Diagnosis not present

## 2014-06-10 DIAGNOSIS — I6521 Occlusion and stenosis of right carotid artery: Secondary | ICD-10-CM | POA: Diagnosis not present

## 2014-06-10 NOTE — Progress Notes (Signed)
Filed Vitals:   06/10/14 1318 06/10/14 1321 06/10/14 1331 06/10/14 1333  BP: 143/90 142/86 141/82 142/88  Pulse: 69 67 68 67  Resp:  16    Height:  5\' 10"  (1.778 m)    Weight:  226 lb (102.513 kg)    SpO2:  95%

## 2014-06-10 NOTE — Progress Notes (Signed)
Established Carotid Patient   History of Present Illness  Michael [name] is a 59 y.o. male patient of Dr. Donnetta Hutching who is s/p left CEA on 04/29/12. He returns today for routine follow up. He has no history of stroke or TIA.  Pt denies claudication symptoms with walking, denies tingling, numbness, or pain in either arm.  Pt denies New Medical or Surgical History.  Pt Diabetic: No Pt smoker: former smoker, quit in 1995  Pt meds include: Statin : Yes ASA: Yes Other anticoagulants/antiplatelets: no  Past Medical History  Diagnosis Date  . Coronary atherosclerosis of artery bypass graft   . Other and unspecified hyperlipidemia     takes Simvastatin daily  . Gout     takes Allopurinol daily  . Allergy   . CAD (coronary artery disease)   . Peripheral vascular disease   . Malignant hyperthermia     Potential but not Confirmed  . Essential hypertension, benign     Takes Amlodipine and Losartan-HCTZ daily;takes Coreg daily  . Anginal pain     last time a yr ago  . Back pain   . Neck pain     yrs ago and saw a chiropractor occasionally  . GERD (gastroesophageal reflux disease)     was on Prilosec;is not currently on any meds  . Urinary urgency   . Bruises easily     Social History History  Substance Use Topics  . Smoking status: Former Smoker -- 1.00 packs/day for 20 years    Types: Cigarettes    Quit date: 05/14/1996  . Smokeless tobacco: Never Used     Comment: quit 1995  . Alcohol Use: 3.6 oz/week    6 Cans of beer per week    Family History Family History  Problem Relation Age of Onset  . Kidney failure Mother   . Cancer Mother   . Arthritis Mother   . Kidney disease Mother   . Hypertension Father   . Healthy Father   . CVA Brother 62  . Hypertension Brother     Surgical History Past Surgical History  Procedure Laterality Date  . Ankle fracture surgery Left 1981  . Colonoscopy    . Inguinal hernia repair  Aug. 2005    bilateral inguinal  . Coronary  artery bypass graft  1999    x 4  . Fracture surgery  1980    screws placed and in 1981 screws removed(this is when was told about potential for MH  . Esophagogastroduodenoscopy    . Cystoscopy    . Endarterectomy Left 04/29/2012    Procedure: ENDARTERECTOMY CAROTID;  Surgeon: Rosetta Posner, MD;  Location: [place];  Service: Vascular;  Laterality: Left;  with primary closure of artery  . Cardiac catheterization  2015  . Colonoscopy N/A 11/21/2013    Procedure: COLONOSCOPY;  Surgeon: Danie Binder, MD;  [facility];  Service: Endoscopy;  Laterality: N/A;  1:15 PM  . Left heart catheterization with coronary/graft angiogram N/A 06/10/2013    Procedure: LEFT HEART CATHETERIZATION WITH Beatrix Fetters;  Surgeon: Peter M Martinique, MD;  Location[facility] CATH LAB;  Service: Cardiovascular;  Laterality: N/A;  . Carotid endarterectomy Left 04-29-12    cea    Allergies  Allergen Reactions  . Anesthetics, Amide Anaphylaxis  . Ramipril Cough  . Losartan     Current Outpatient Prescriptions  Medication Sig Dispense Refill  . allopurinol (ZYLOPRIM) 300 MG tablet TAKE 1 TABLET DAILY 90 tablet 2  . amLODipine (  NORVASC) 10 MG tablet TAKE 1 TABLET DAILY 90 tablet 1  . aspirin 325 MG tablet Take 325 mg by mouth daily.    . carvedilol (COREG) 25 MG tablet Take 1 tablet (25 mg total) by mouth 2 (two) times daily with a meal. 90 tablet 12  . chlorzoxazone (PARAFON FORTE DSC) 500 MG tablet Take 1 tablet (500 mg total) by mouth 3 (three) times daily as needed for muscle spasms. (Patient not taking: Reported on 06/10/2014) 30 tablet 0  . diphenhydrAMINE (BENADRYL) 25 MG tablet Take 25-50 mg by mouth daily as needed for allergies.    . diphenhydrAMINE (BENADRYL) 25 MG tablet Take 1 tablet (25 mg total) by mouth every 6 (six) hours. (Patient not taking: Reported on 06/10/2014) 20 tablet 0  . EPIPEN 2-PAK 0.3 MG/0.3ML SOAJ injection     . famotidine (PEPCID) 20 MG tablet Take 1 tablet (20 mg total) by  mouth 2 (two) times daily. 30 tablet 0  . hydrochlorothiazide (HYDRODIURIL) 25 MG tablet Take 1 tablet (25 mg total) by mouth daily. 90 tablet 3  . HYDROcodone-acetaminophen (NORCO/VICODIN) 5-325 MG per tablet Take 1 tablet by mouth every 6 (six) hours as needed. (Patient taking differently: Take 1 tablet by mouth every 6 (six) hours as needed for moderate pain. ) 28 tablet 0  . indomethacin (INDOCIN) 25 MG capsule Take 25 mg by mouth 2 (two) times daily as needed (Gout pain).    . montelukast (SINGULAIR) 10 MG tablet     . Multiple Vitamin (MULTI-VITAMIN DAILY PO) Take 1 tablet by mouth daily.     . predniSONE (DELTASONE) 10 MG tablet Take 4 tablets (40 mg total) by mouth daily. 20 tablet 0  . predniSONE (DELTASONE) 20 MG tablet 3 tablets a day for 3 days, then 2 tablets a day for 3 days and the one tablet a day for 2 days (Patient not taking: Reported on 05/22/2014) 17 tablet 0  . SALINE NASAL MIST NA Place 1 spray into both nostrils daily as needed (allergies).    . simvastatin (ZOCOR) 20 MG tablet Take 1 tablet (20 mg total) by mouth at bedtime. (Patient taking differently: Take 20 mg by mouth every morning. ) 90 tablet 1   No current facility-administered medications for this visit.    Review of Systems : See HPI for pertinent positives and negatives.  Physical Examination  Filed Vitals:   06/10/14 1318 06/10/14 1321  BP: 143/90 142/86  Pulse: 69 67  Resp:  16  Height:  5\' 10"  (1.778 m)  Weight:  226 lb (102.513 kg)  SpO2:  95%   Body mass index is 32.43 kg/(m^2).  General: WDWN male in NAD GAIT: normal Eyes: PERRLA Pulmonary: Non-labored, CTAB, Negative Rales, Negative rhonchi, & Negative wheezing.  Cardiac: regular Rhythm , Negative detected murmur.  VASCULAR EXAM Carotid Bruits Left Right   Negative Negative   Radial pulses are 2+ palpable and equal.       Gastrointestinal: soft, nontender, BS WNL, no r/g, negative masses.  Musculoskeletal: Negative muscle atrophy/wasting. M/S 5/5 throughout, Extremities without ischemic changes.  Neurologic: A&O X 3; Appropriate Affect ; SENSATION ;normal;  Speech is normal CN 2-12 intact, Pain and light touch intact in extremities, Motor exam as listed above.         Non-Invasive Vascular Imaging CAROTID DUPLEX 06/10/2014   CEREBROVASCULAR DUPLEX EVALUATION    INDICATION: Carotid artery disease    PREVIOUS INTERVENTION(S): Left carotid endarterectomy 04/29/2012    DUPLEX EXAM: Carotid duplex  RIGHT  LEFT  Peak Systolic Velocities (cm/s) End Diastolic Velocities (cm/s) Plaque LOCATION Peak Systolic Velocities (cm/s) End Diastolic Velocities (cm/s) Plaque  109 23 - CCA PROXIMAL 136 31 -  111 28 - CCA MID 110 31 -  102 27 - CCA DISTAL 88 31 HM  104 18 - ECA 101 21 -  108 30 HT ICA PROXIMAL 82 24 -  89 31 - ICA MID 87 38 -  67 27 - ICA DISTAL 93 35 -    .97 ICA / CCA Ratio (PSV) N/A  Antegrade Vertebral Flow Antegrade  655 Brachial Systolic Pressure (mmHg) 374  Triphasic Brachial Artery Waveforms Triphasic    Plaque Morphology:  HM = Homogeneous, HT = Heterogeneous, CP = Calcific Plaque, SP = Smooth Plaque, IP = Irregular Plaque  ADDITIONAL FINDINGS:     IMPRESSION: 1. 1 - 49% right internal carotid artery stenosis, lower end of range. 2. Patent left endarterectomy site with no evidence for restenosis.    Compared to the previous exam:  No significant change since prior exam.      Assessment: Michael [name] is a 59 y.o. male who is s/p left CEA on 04/29/12 with no history of stroke or TIA. Today's carotid Duplex suggests Dminimal right ICA stenosis and a patent left endarterectomy site with no evidence for restenosis. No significant change sine 06/03/13 Duplex.   Plan: Follow-up in  1 year with Carotid Duplex.   I discussed in depth with the patient the nature of atherosclerosis, and emphasized the importance of maximal medical management including strict control of blood pressure, blood glucose, and lipid levels, obtaining regular exercise, and continued cessation of smoking.  The patient is aware that without maximal medical management the underlying atherosclerotic disease process will progress, limiting the benefit of any interventions. The patient was given information about stroke prevention and what symptoms should prompt the patient to seek immediate medical care. Thank you for allowing [place] to participate in this patient's care.  Clemon Chambers, RN, MSN, FNP-C [facility] Office: [facility] Physician: Oneida Alar  06/10/2014 1:26 PM

## 2014-06-11 NOTE — Addendum Note (Signed)
Addended by: Dorthula Rue L on: 06/11/2014 05:06 PM   Modules accepted: Orders

## 2014-07-29 ENCOUNTER — Other Ambulatory Visit: Payer: Self-pay | Admitting: Cardiology

## 2014-10-08 ENCOUNTER — Telehealth: Payer: Self-pay | Admitting: Family Medicine

## 2014-10-12 NOTE — Telephone Encounter (Signed)
error 

## 2014-10-21 ENCOUNTER — Telehealth: Payer: Self-pay | Admitting: Family Medicine

## 2014-10-21 ENCOUNTER — Other Ambulatory Visit: Payer: Self-pay

## 2014-10-21 MED ORDER — PREDNISONE 20 MG PO TABS: ORAL_TABLET | ORAL | Status: DC

## 2014-10-21 NOTE — Telephone Encounter (Signed)
Patient notified

## 2014-10-21 NOTE — Telephone Encounter (Signed)
May do prednisone 20 mg,#18, 3qd for 3d then 2qd for 3d then 1qd for 3d

## 2014-10-21 NOTE — Telephone Encounter (Signed)
[facility] 10/21/2014

## 2014-10-21 NOTE — Telephone Encounter (Signed)
Pt states his back has a strain, got up this morning an could hardly walk.  Lower back muscular pain   He states he got a prednisone pack last time that help tremendously   [employer]

## 2014-11-05 ENCOUNTER — Telehealth: Payer: Self-pay | Admitting: Family Medicine

## 2014-11-05 DIAGNOSIS — Z125 Encounter for screening for malignant neoplasm of prostate: Secondary | ICD-10-CM

## 2014-11-05 DIAGNOSIS — I10 Essential (primary) hypertension: Secondary | ICD-10-CM

## 2014-11-05 DIAGNOSIS — M109 Gout, unspecified: Secondary | ICD-10-CM

## 2014-11-05 DIAGNOSIS — Z79899 Other long term (current) drug therapy: Secondary | ICD-10-CM

## 2014-11-05 DIAGNOSIS — E785 Hyperlipidemia, unspecified: Secondary | ICD-10-CM

## 2014-11-05 NOTE — Telephone Encounter (Signed)
Rep same 

## 2014-11-05 NOTE — Telephone Encounter (Signed)
Orders ready. Pt notified on vm.

## 2014-11-05 NOTE — Telephone Encounter (Signed)
Pt is requesting lab orders to be sent over. Pt would like to go in the morning to have these done if possible. Last labs per epic were: psa,lipid,hepatic,bmp and uric acid on 10/08/13

## 2014-11-07 LAB — HEPATIC FUNCTION PANEL
ALT: 36 IU/L (ref 0–44)
AST: 27 IU/L (ref 0–40)
Albumin: 4.2 g/dL (ref 3.6–4.8)
Alkaline Phosphatase: 77 IU/L (ref 39–117)
Bilirubin Total: 0.4 mg/dL (ref 0.0–1.2)
Bilirubin, Direct: 0.13 mg/dL (ref 0.00–0.40)
Total Protein: 6.8 g/dL (ref 6.0–8.5)

## 2014-11-07 LAB — LIPID PANEL
CHOLESTEROL TOTAL: 182 mg/dL (ref 100–199)
Chol/HDL Ratio: 4.3 ratio units (ref 0.0–5.0)
HDL: 42 mg/dL (ref >39)
LDL CALC: 86 mg/dL (ref 0–99)
TRIGLYCERIDES: 269 mg/dL — AB (ref 0–149)
VLDL CHOLESTEROL CAL: 54 mg/dL — AB (ref 5–40)

## 2014-11-07 LAB — PSA: Prostate Specific Ag, Serum: 1.1 ng/mL (ref 0.0–4.0)

## 2014-11-07 LAB — BASIC METABOLIC PANEL
BUN/Creatinine Ratio: 13 (ref 10–22)
BUN: 14 mg/dL (ref 8–27)
CALCIUM: 9.3 mg/dL (ref 8.6–10.2)
CHLORIDE: 99 mmol/L (ref 97–106)
CO2: 27 mmol/L (ref 18–29)
Creatinine, Ser: 1.04 mg/dL (ref 0.76–1.27)
GFR calc Af Amer: 90 mL/min/{1.73_m2} (ref >59)
GFR calc non Af Amer: 78 mL/min/{1.73_m2} (ref >59)
GLUCOSE: 111 mg/dL — AB (ref 65–99)
POTASSIUM: 4.1 mmol/L (ref 3.5–5.2)
SODIUM: 143 mmol/L (ref 136–144)

## 2014-11-07 LAB — URIC ACID: URIC ACID: 6 mg/dL (ref 3.7–8.6)

## 2014-11-12 ENCOUNTER — Encounter: Payer: Self-pay | Admitting: Cardiology

## 2014-11-12 ENCOUNTER — Other Ambulatory Visit: Payer: Self-pay

## 2014-11-12 MED ORDER — HYDROCHLOROTHIAZIDE 25 MG PO TABS: 25.0000 mg | ORAL_TABLET | Freq: Every day | ORAL | Status: DC

## 2014-11-12 MED ORDER — SIMVASTATIN 20 MG PO TABS: 20.0000 mg | ORAL_TABLET | Freq: Every day | ORAL | Status: DC

## 2014-11-12 NOTE — Telephone Encounter (Signed)
Sent in refill of medication at patient's request to [facility].

## 2014-11-13 ENCOUNTER — Encounter: Payer: Self-pay | Admitting: Family Medicine

## 2014-11-13 ENCOUNTER — Other Ambulatory Visit: Payer: Self-pay

## 2014-11-13 ENCOUNTER — Ambulatory Visit (INDEPENDENT_AMBULATORY_CARE_PROVIDER_SITE_OTHER): Payer: 59 | Admitting: Family Medicine

## 2014-11-13 VITALS — BP 130/84 | Ht 69.5 in | Wt 233.0 lb

## 2014-11-13 DIAGNOSIS — N400 Enlarged prostate without lower urinary tract symptoms: Secondary | ICD-10-CM

## 2014-11-13 DIAGNOSIS — Z0000 Encounter for general adult medical examination without abnormal findings: Secondary | ICD-10-CM | POA: Diagnosis not present

## 2014-11-13 MED ORDER — TAMSULOSIN HCL 0.4 MG PO CAPS: ORAL_CAPSULE | ORAL | Status: DC

## 2014-11-13 MED ORDER — SIMVASTATIN 20 MG PO TABS: 20.0000 mg | ORAL_TABLET | Freq: Every day | ORAL | Status: DC

## 2014-11-13 NOTE — Progress Notes (Signed)
Subjective:    Patient ID: Michael [name], male    DOB: [birth year], 59 y.o.   MRN: [id number]  HPI The patient comes in today for a wellness visit.  Not exercising much these days  A review of their health history was completed.  A review of medications was also completed.  Any needed refills; None  Eating habits: Poor, Patient states that he eats a lot and does not eat well.  Falls/  MVA accidents in past few months: None  Regular exercise: Patient states does not really exercise due to back pain.  Specialist pt sees on regular basis: Allergist  Preventative health issues were discussed.   Additional concerns: Patient states no concerns this visit. Results for orders placed or performed in visit on 11/05/14  Lipid panel  Result Value Ref Range   Cholesterol, Total 182 100 - 199 mg/dL   Triglycerides 269 (H) 0 - 149 mg/dL   HDL 42 >39 mg/dL   VLDL Cholesterol Cal 54 (H) 5 - 40 mg/dL   LDL Calculated 86 0 - 99 mg/dL   Chol/HDL Ratio 4.3 0.0 - 5.0 ratio units  Hepatic function panel  Result Value Ref Range   Total Protein 6.8 6.0 - 8.5 g/dL   Albumin 4.2 3.6 - 4.8 g/dL   Bilirubin Total 0.4 0.0 - 1.2 mg/dL   Bilirubin, Direct 0.13 0.00 - 0.40 mg/dL   Alkaline Phosphatase 77 39 - 117 IU/L   AST 27 0 - 40 IU/L   ALT 36 0 - 44 IU/L  PSA  Result Value Ref Range   Prostate Specific Ag, Serum 1.1 0.0 - 4.0 ng/mL  Basic metabolic panel  Result Value Ref Range   Glucose 111 (H) 65 - 99 mg/dL   BUN 14 8 - 27 mg/dL   Creatinine, Ser 1.04 0.76 - 1.27 mg/dL   GFR calc non Af Amer 78 >59 mL/min/1.73   GFR calc Af Amer 90 >59 mL/min/1.73   BUN/Creatinine Ratio 13 10 - 22   Sodium 143 136 - 144 mmol/L   Potassium 4.1 3.5 - 5.2 mmol/L   Chloride 99 97 - 106 mmol/L   CO2 27 18 - 29 mmol/L   Calcium 9.3 8.6 - 10.2 mg/dL  Uric acid  Result Value Ref Range   Uric Acid 6.0 3.7 - 8.6 mg/dL   Joints okay,  No major injuries of joint s  Patient gets up at nighttime to urinate  frequently. At least twicd maybe three. Frequent urination during the day. Worsening the last couple years and impaction on quality of health Last colon done 2015 Review of Systems  Constitutional: Negative for fever, activity change and appetite change.  HENT: Negative for congestion and rhinorrhea.   Eyes: Negative for discharge.  Respiratory: Negative for cough and wheezing.   Cardiovascular: Negative for chest pain.  Gastrointestinal: Negative for vomiting, abdominal pain and blood in stool.  Genitourinary: Negative for frequency and difficulty urinating.  Musculoskeletal: Negative for neck pain.  Skin: Negative for rash.  Allergic/Immunologic: Negative for environmental allergies and food allergies.  Neurological: Negative for weakness and headaches.  Psychiatric/Behavioral: Negative for agitation.  All other systems reviewed and are negative.      Objective:   Physical Exam  Constitutional: He appears well-developed and well-nourished.  HENT:  Head: Normocephalic and atraumatic.  Right Ear: External ear normal.  Left Ear: External ear normal.  Nose: Nose normal.  Mouth/Throat: Oropharynx is clear and moist.  Eyes: EOM are normal. Pupils  are equal, round, and reactive to light.  Neck: Normal range of motion. Neck supple. No thyromegaly present.  Cardiovascular: Normal rate, regular rhythm and normal heart sounds.   No murmur heard. Pulmonary/Chest: Effort normal and breath sounds normal. No respiratory distress. He has no wheezes.  Abdominal: Soft. Bowel sounds are normal. He exhibits no distension and no mass. There is no tenderness.  Genitourinary: Penis normal.  Prostate gland diffuse enlargement  Musculoskeletal: Normal range of motion. He exhibits no edema.  Lymphadenopathy:    He has no cervical adenopathy.  Neurological: He is alert. He exhibits normal muscle tone.  Skin: Skin is warm and dry. No erythema.  Psychiatric: He has a normal mood and affect. His  behavior is normal. Judgment normal.  Vitals reviewed.         Assessment & Plan:  Impression 1 wellness exam #2 coronary artery disease with hypertension followed by specialist #3 progressive prostate hypertrophy quite symptomatic discussion held risks and benefits of medicine patient like to try plan add Flomax diet exercise discuss, patient up-to-date on colonoscopy [facility]

## 2014-11-15 DIAGNOSIS — N400 Enlarged prostate without lower urinary tract symptoms: Secondary | ICD-10-CM | POA: Insufficient documentation

## 2014-12-08 ENCOUNTER — Ambulatory Visit (INDEPENDENT_AMBULATORY_CARE_PROVIDER_SITE_OTHER): Payer: 59 | Admitting: Allergy and Immunology

## 2014-12-08 ENCOUNTER — Encounter: Payer: Self-pay | Admitting: Allergy and Immunology

## 2014-12-08 VITALS — BP 150/82 | HR 80 | Resp 16

## 2014-12-08 DIAGNOSIS — T7840XD Allergy, unspecified, subsequent encounter: Secondary | ICD-10-CM

## 2014-12-08 DIAGNOSIS — J3089 Other allergic rhinitis: Secondary | ICD-10-CM | POA: Diagnosis not present

## 2014-12-08 DIAGNOSIS — T7840XA Allergy, unspecified, initial encounter: Secondary | ICD-10-CM | POA: Insufficient documentation

## 2014-12-08 NOTE — Patient Instructions (Signed)
  1. Attempt to discontinue montelukast  2. Continue Zyrtec 10 mg and Pepcid 10 mg daily  3. Continue Rhinocort one spray each nostril 3-7 times per week  4. Continue EpiPen if needed  5. Return in 6 months or earlier if problem

## 2014-12-08 NOTE — Progress Notes (Signed)
[name]  Follow-[facility] Note  Refering Provider: Mikey Kirschner, MD Primary Provider: Mickie Hillier, MD  Subjective:   Michael [name] is a 59 y.o. male who [facility]-evaluation of the following:  HPI Comments:  Michael [name] returns to this clinic on 12/08/2014 in reevaluation of his recurrent allergic reactions and history of allergic rhinitis. He has not had any allergic reactions over the course of the past 6 months. He's been consistently using Zyrtec and Pepcid and montelukast on a daily basis. He has now restarted eating mammal at least 3 times per week and has had no problem whatsoever. He's not been having any issues with his nose. He does use Rhinocort intermittently which she thinks resulted in very good response regarding his allergic rhinitis.   Outpatient Encounter Prescriptions as of 12/08/2014  Medication Sig  . allopurinol (ZYLOPRIM) 300 MG tablet TAKE 1 TABLET DAILY  . amLODipine (NORVASC) 10 MG tablet TAKE 1 TABLET DAILY  . aspirin 325 MG tablet Take 325 mg by mouth daily.  . budesonide (RHINOCORT AQUA) 32 MCG/ACT nasal spray Place 1 spray into both nostrils daily.  . carvedilol (COREG) 25 MG tablet Take 1 tablet (25 mg total) by mouth 2 (two) times daily with a meal.  . cetirizine (ZYRTEC) 10 MG tablet Take 10 mg by mouth daily.  . chlorzoxazone (PARAFON FORTE DSC) 500 MG tablet Take 1 tablet (500 mg total) by mouth 3 (three) times daily as needed for muscle spasms.  . diphenhydrAMINE (BENADRYL) 25 MG tablet Take 25-50 mg by mouth daily as needed for allergies.  . diphenhydrAMINE (BENADRYL) 25 MG tablet Take 1 tablet (25 mg total) by mouth every 6 (six) hours.  Marland Kitchen EPIPEN 2-PAK 0.3 MG/0.3ML SOAJ injection   . famotidine (PEPCID) 20 MG tablet Take 1 tablet (20 mg total) by mouth 2 (two) times daily. (Patient taking differently: Take 10 mg by mouth 2 (two) times daily. )  .  hydrochlorothiazide (HYDRODIURIL) 25 MG tablet Take 1 tablet (25 mg total) by mouth daily.  Marland Kitchen HYDROcodone-acetaminophen (NORCO/VICODIN) 5-325 MG per tablet Take 1 tablet by mouth every 6 (six) hours as needed.  . indomethacin (INDOCIN) 25 MG capsule Take 25 mg by mouth 2 (two) times daily as needed (Gout pain).  . montelukast (SINGULAIR) 10 MG tablet Take 10 mg by mouth daily.   . Multiple Vitamin (MULTI-VITAMIN DAILY PO) Take 1 tablet by mouth daily.   . predniSONE (DELTASONE) 20 MG tablet Take 3 tablets by mouth for 3 days, then take 2 tablets for 3 days, then take 1 tablet for 3 days.  Marland Kitchen SALINE NASAL MIST NA Place 1 spray into both nostrils daily as needed (allergies).  . simvastatin (ZOCOR) 20 MG tablet Take 1 tablet (20 mg total) by mouth at bedtime.  . tamsulosin (FLOMAX) 0.4 MG CAPS capsule Take 1 tablet daily at bedtime.   No facility-administered encounter medications on file as of 12/08/2014.    No orders of the defined types were placed in this encounter.    Past Medical History  Diagnosis Date  . Coronary atherosclerosis of artery bypass graft   . Other and unspecified hyperlipidemia     takes Simvastatin daily  . Gout     takes Allopurinol daily  . Allergy   . CAD (coronary artery disease)   . Peripheral vascular disease ([place])   . Malignant hyperthermia     Potential but not  Confirmed  . Essential hypertension, benign     Takes Amlodipine and Losartan-HCTZ daily;takes Coreg daily  . Anginal pain ([place])     last time a yr ago  . Back pain   . Neck pain     yrs ago and saw a chiropractor occasionally  . GERD (gastroesophageal reflux disease)     was on Prilosec;is not currently on any meds  . Urinary urgency   . Bruises easily     Past Surgical History  Procedure Laterality Date  . Ankle fracture surgery Left 1981  . Colonoscopy    . Inguinal hernia repair  Aug. 2005    bilateral inguinal  . Coronary artery bypass graft  1999    x 4  . Fracture surgery  1980     screws placed and in 1981 screws removed(this is when was told about potential for MH  . Esophagogastroduodenoscopy    . Cystoscopy    . Endarterectomy Left 04/29/2012    Procedure: ENDARTERECTOMY CAROTID;  Surgeon: Rosetta Posner, MD;  Location: [place];  Service: Vascular;  Laterality: Left;  with primary closure of artery  . Cardiac catheterization  2015  . Colonoscopy N/A 11/21/2013    Procedure: COLONOSCOPY;  Surgeon: Danie Binder, MD;  Location[facility];  Service: Endoscopy;  Laterality: N/A;  1:15 PM  . Left heart catheterization with coronary/graft angiogram N/A 06/10/2013    Procedure: LEFT HEART CATHETERIZATION WITH Beatrix Fetters;  Surgeon: Peter M Martinique, MD;  Location: [facility] C[facility];  Service: Cardiovascular;  Laterality: N/A;  . Carotid endarterectomy Left 04-29-12    cea    Allergies  Allergen Reactions  . Anesthetics, [place] Anaphylaxis  . Ramipril Cough  . Losartan     Review of Systems  Constitutional: Negative for fever, chills and fatigue.  HENT: Negative for congestion, ear pain, facial swelling, hearing loss, nosebleeds, postnasal drip, rhinorrhea, sinus pressure, sneezing, sore throat, tinnitus, trouble swallowing and voice change.   Eyes: Negative for pain, discharge, redness and itching.  Respiratory: Negative for cough, chest tightness, shortness of breath and wheezing.   Cardiovascular: Negative for chest pain and leg swelling.  Gastrointestinal: Negative for nausea, vomiting and abdominal pain.  Musculoskeletal: Negative for myalgias and arthralgias.  Skin: Negative for rash.  Allergic/Immunologic: Negative.   Neurological: Negative for dizziness and headaches.  Hematological: Negative for adenopathy.     Objective:   Filed Vitals:   12/08/14 1622  BP: 150/82  Pulse: 80  Resp: 16          Physical Exam  Constitutional: He appears well-developed and well-nourished. No distress.  HENT:  Head: Normocephalic and atraumatic. Head is  without right periorbital erythema and without left periorbital erythema.  Right Ear: Tympanic membrane, external ear and ear canal normal. No drainage or tenderness. No foreign bodies. Tympanic membrane is not injected, not scarred, not perforated, not erythematous, not retracted and not bulging. No middle ear effusion.  Left Ear: Tympanic membrane, external ear and ear canal normal. No drainage or tenderness. No foreign bodies. Tympanic membrane is not injected, not scarred, not perforated, not erythematous, not retracted and not bulging.  No middle ear effusion.  Nose: Nose normal. No mucosal edema, rhinorrhea, nose lacerations or sinus tenderness.  No foreign bodies.  Mouth/Throat: Oropharynx is clear and moist. No oropharyngeal exudate, posterior oropharyngeal edema, posterior oropharyngeal erythema or tonsillar abscesses.  Eyes: Lids are normal. Right eye exhibits no chemosis, no discharge and no exudate. No foreign body present  in the right eye. Left eye exhibits no chemosis, no discharge and no exudate. No foreign body present in the left eye. Right conjunctiva is not injected. Left conjunctiva is not injected.  Neck: Neck supple. No tracheal tenderness present. No tracheal deviation and no edema present. No thyroid mass and no thyromegaly present.  Cardiovascular: Normal rate, regular rhythm, S1 normal and S2 normal.  Exam reveals no gallop.   No murmur heard. Pulmonary/Chest: No accessory muscle usage or stridor. No respiratory distress. He has no wheezes. He has no rhonchi. He has no rales.  Abdominal: Soft.  Lymphadenopathy:       Head (right side): No tonsillar adenopathy present.       Head (left side): No tonsillar adenopathy present.    He has no cervical adenopathy.  Neurological: He is alert.  Skin: No rash noted. He is not diaphoretic.  Psychiatric: He has a normal mood and affect. His behavior is normal.    Diagnostics: None  Assessment and Plan:   1. Allergic reaction,  subsequent encounter   2. Other allergic rhinitis      1. Attempt to discontinue montelukast  2. Continue Zyrtec 10 mg and Pepcid 10 mg daily  3. Continue Rhinocort one spray each nostril 3-7 times per week  4. Continue EpiPen if needed  5. Return in 6 months or earlier if problem  I will assume that as we start to taper down Chandra's medications he will continue to do well and not have recurrent allergic reactions. We'll start off by eliminating his montelukast. He appears to be able to tolerate mammal consumption at this point in time and we will remove his labile of alpha gal syndrome. I'll see him back in this clinic in 6 months or earlier if there is a problem.   Allena Katz, MD [place]

## 2015-01-05 ENCOUNTER — Other Ambulatory Visit: Payer: 59 | Admitting: *Deleted

## 2015-01-05 ENCOUNTER — Other Ambulatory Visit: Payer: Self-pay | Admitting: Cardiology

## 2015-01-05 DIAGNOSIS — I10 Essential (primary) hypertension: Secondary | ICD-10-CM

## 2015-01-05 DIAGNOSIS — E785 Hyperlipidemia, unspecified: Secondary | ICD-10-CM

## 2015-01-05 LAB — COMPREHENSIVE METABOLIC PANEL
ALT: 41 U/L (ref 9–46)
AST: 34 U/L (ref 10–35)
Albumin: 4.2 g/dL (ref 3.6–5.1)
Alkaline Phosphatase: 65 U/L (ref 40–115)
BUN: 15 mg/dL (ref 7–25)
CO2: 30 mmol/L (ref 20–31)
Calcium: 9.6 mg/dL (ref 8.6–10.3)
Chloride: 98 mmol/L (ref 98–110)
Creat: 1.04 mg/dL (ref 0.70–1.25)
Glucose, Bld: 109 mg/dL — ABNORMAL HIGH (ref 65–99)
Potassium: 4 mmol/L (ref 3.5–5.3)
Sodium: 139 mmol/L (ref 135–146)
Total Bilirubin: 0.5 mg/dL (ref 0.2–1.2)
Total Protein: 7 g/dL (ref 6.1–8.1)

## 2015-01-05 LAB — LIPID PANEL
Cholesterol: 163 mg/dL (ref 125–200)
HDL: 41 mg/dL (ref >=40)
LDL Cholesterol: 94 mg/dL (ref <130)
Total CHOL/HDL Ratio: 4 Ratio (ref <=5.0)
Triglycerides: 140 mg/dL (ref <150)
VLDL: 28 mg/dL (ref <30)

## 2015-02-02 ENCOUNTER — Encounter: Payer: Self-pay | Admitting: *Deleted

## 2015-02-05 ENCOUNTER — Ambulatory Visit: Payer: 59 | Admitting: Cardiology

## 2015-02-12 ENCOUNTER — Ambulatory Visit (INDEPENDENT_AMBULATORY_CARE_PROVIDER_SITE_OTHER): Payer: 59 | Admitting: Cardiology

## 2015-02-12 ENCOUNTER — Encounter: Payer: Self-pay | Admitting: Cardiology

## 2015-02-12 VITALS — BP 160/72 | HR 73 | Ht 69.5 in | Wt 234.0 lb

## 2015-02-12 DIAGNOSIS — I779 Disorder of arteries and arterioles, unspecified: Secondary | ICD-10-CM | POA: Diagnosis not present

## 2015-02-12 DIAGNOSIS — E785 Hyperlipidemia, unspecified: Secondary | ICD-10-CM

## 2015-02-12 DIAGNOSIS — I2510 Atherosclerotic heart disease of native coronary artery without angina pectoris: Secondary | ICD-10-CM | POA: Diagnosis not present

## 2015-02-12 DIAGNOSIS — I10 Essential (primary) hypertension: Secondary | ICD-10-CM | POA: Diagnosis not present

## 2015-02-12 DIAGNOSIS — I2583 Coronary atherosclerosis due to lipid rich plaque: Secondary | ICD-10-CM

## 2015-02-12 DIAGNOSIS — I739 Peripheral vascular disease, unspecified: Secondary | ICD-10-CM

## 2015-02-12 DIAGNOSIS — R9431 Abnormal electrocardiogram [ECG] [EKG]: Secondary | ICD-10-CM

## 2015-02-12 MED ORDER — CARVEDILOL 25 MG PO TABS: 25.0000 mg | ORAL_TABLET | Freq: Two times a day (BID) | ORAL | Status: DC

## 2015-02-12 MED ORDER — SIMVASTATIN 20 MG PO TABS: 20.0000 mg | ORAL_TABLET | Freq: Every day | ORAL | Status: DC

## 2015-02-12 MED ORDER — ALLOPURINOL 300 MG PO TABS: 300.0000 mg | ORAL_TABLET | Freq: Every day | ORAL | Status: DC

## 2015-02-12 MED ORDER — AMLODIPINE BESYLATE 10 MG PO TABS: 10.0000 mg | ORAL_TABLET | Freq: Every day | ORAL | Status: DC

## 2015-02-12 MED ORDER — ISOSORBIDE MONONITRATE ER 30 MG PO TB24: 30.0000 mg | ORAL_TABLET | Freq: Every day | ORAL | Status: DC

## 2015-02-12 MED ORDER — HYDROCHLOROTHIAZIDE 25 MG PO TABS: 25.0000 mg | ORAL_TABLET | Freq: Every day | ORAL | Status: DC

## 2015-02-12 NOTE — Patient Instructions (Addendum)
Medication Instructions:  Your physician has recommended you make the following change in your medication:  1. Start Imdur ( 30 mg ) daily, Allopurinol was also filled at visit today 2. [facility]  Labwork: -None  Testing/Procedures: -None  Follow-Up: Your physician recommends that you keep scheduled a follow-up appointment with Dr. Meda Coffee   Any Other Special Instructions Will Be Listed Below (If Applicable).     If you need a refill on your cardiac medications before your next appointment, please call your pharmacy.

## 2015-02-12 NOTE — Progress Notes (Signed)
Patient ID: MARQUITA SISKIND, male   DOB: [birth year], 60 y.o.   MRN: [id number]    Patient Name: Michael [name] Date of Encounter: 02/12/2015  Primary Care Provider:  Mickie Hillier, MD Primary Cardiologist:  Michael [name]   Problem List   Past Medical History  Diagnosis Date  . Coronary atherosclerosis of artery bypass graft   . Other and unspecified hyperlipidemia     takes Simvastatin daily  . Gout     takes Allopurinol daily  . Allergy   . CAD (coronary artery disease)   . Peripheral vascular disease ([place])   . Malignant hyperthermia     Potential but not Confirmed  . Essential hypertension, benign     Takes Amlodipine and Losartan-HCTZ daily;takes Coreg daily  . Anginal pain ([place])     last time a yr ago  . Back pain   . Neck pain     yrs ago and saw a chiropractor occasionally  . GERD (gastroesophageal reflux disease)     was on Prilosec;is not currently on any meds  . Urinary urgency   . Bruises easily    Past Surgical History  Procedure Laterality Date  . Ankle fracture surgery Left 1981  . Colonoscopy    . Inguinal hernia repair Bilateral Aug. 2005  . Coronary artery bypass graft  1999    x 4  . Fracture surgery  1980    screws placed and in 1981 screws removed(this is when was told about potential for MH  . Esophagogastroduodenoscopy    . Cystoscopy    . Endarterectomy Left 04/29/2012    Procedure: ENDARTERECTOMY CAROTID;  Surgeon: Michael Posner, MD;  Location: [facility];  Service: Vascular;  Laterality: Left;  with primary closure of artery  . Cardiac catheterization  2015  . Colonoscopy N/A 11/21/2013    Procedure: COLONOSCOPY;  Surgeon: Michael Binder, MD;  [facility];  Service: Endoscopy;  Laterality: N/A;  1:15 PM  . Left heart catheterization with coronary/graft angiogram N/A 06/10/2013    Procedure: LEFT HEART CATHETERIZATION WITH Beatrix Fetters;  Surgeon: Michael M Martinique, MD;  Location: [facility] CATH LAB;  Service: Cardiovascular;  Laterality:  N/A;  . Carotid endarterectomy Left 04-29-12    cea    Allergies  Allergies  Allergen Reactions  . Anesthetics, Amide Anaphylaxis  . Ramipril Cough  . Losartan     HPI  Michael [name] is a former patient of Dr Michael [name]. He has h/o CAD, CABG in 1999, PAD, s/p left carotid endarterectomy in April 2014, hypertension and hyperlipidemia. This is his 1 year follow up. He denies any chest pain and feels SOB on strenuous exercise. He underwent carotid endarterectomy for symptoms of dizziness and neck tingling that has resolved partially. He still feels dizzy sometimes. He denies palpitations, orthopnea, PND or syncope. He has gained weight, the last year he started to exercise daily, but with symptoms of dizziness and recent surgery he stopped. He is motivated to do so. He states that sometimes he develops feet edema.  The patient was scheduled for 1 year follow up but is coming after 6 months and is reporting dizziness especially after he stands up but can occur while walking for a while as well. No syncope. He is also experiencing retrosternal tightness that is not exertional, the last one a week ago that persisted for hours. No obvious exacerbating or alleviating factors. No palpitations, no LE edema, orthopnea or PND.   01/16/14 - the patient is  coming after 6 months, he underwent left cardiac cath in June 2015 for abnormal stress test. The cath showed severe native 3VD and all bypass vessels patent.   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 Since the last week he is he has bellow intermittent facial swelling and tongue swelling and underwent extensive workup, and seems to be responding to antihistamines. He has gained weight, and has impaired glucose tolerance now.   Home Medications  Current outpatient prescriptions:  .  allopurinol  (ZYLOPRIM) 300 MG tablet, TAKE 1 TABLET DAILY, Disp: 90 tablet, Rfl: 1 .  amLODipine (NORVASC) 10 MG tablet, TAKE 1 TABLET DAILY, Disp: 90 tablet, Rfl: 1 .  aspirin 325 MG tablet, Take 325 mg by mouth daily., Disp: , Rfl:  .  budesonide (RHINOCORT AQUA) 32 MCG/ACT nasal spray, Place 1 spray into both nostrils daily as needed. , Disp: , Rfl:  .  carvedilol (COREG) 25 MG tablet, Take 1 tablet (25 mg total) by mouth 2 (two) times daily with a meal., Disp: 90 tablet, Rfl: 12 .  cetirizine (ZYRTEC) 10 MG tablet, Take 10 mg by mouth daily., Disp: , Rfl:  .  EPIPEN 2-PAK 0.3 MG/0.3ML SOAJ injection, , Disp: , Rfl:  .  famotidine (PEPCID) 20 MG tablet, Take 1 tablet (20 mg total) by mouth 2 (two) times daily., Disp: 30 tablet, Rfl: 0 .  hydrochlorothiazide (HYDRODIURIL) 25 MG tablet, Take 1 tablet (25 mg total) by mouth daily., Disp: 90 tablet, Rfl: 1 .  indomethacin (INDOCIN) 25 MG capsule, Take 25 mg by mouth 2 (two) times daily as needed (Gout pain)., Disp: , Rfl:  .  Multiple Vitamin (MULTI-VITAMIN DAILY PO), Take 1 tablet by mouth daily. , Disp: , Rfl:  .  predniSONE (DELTASONE) 20 MG tablet, Take 3 tablets by mouth for 3 days, then take 2 tablets for 3 days, then take 1 tablet for 3 days., Disp: 18 tablet, Rfl: 0 .  SALINE NASAL MIST NA, Place 1 spray into both nostrils daily as needed (allergies)., Disp: , Rfl:  .  simvastatin (ZOCOR) 20 MG tablet, Take 1 tablet (20 mg total) by mouth at bedtime., Disp: 90 tablet, Rfl: 3 .  tamsulosin (FLOMAX) 0.4 MG CAPS capsule, Take 1 tablet daily at bedtime., Disp: 30 capsule, Rfl: 11 .  chlorzoxazone (PARAFON FORTE DSC) 500 MG tablet, Take 1 tablet (500 mg total) by mouth 3 (three) times daily as needed for muscle spasms. (Patient not taking: Reported on 02/12/2015), Disp: 30 tablet, Rfl: 0 .  diphenhydrAMINE (BENADRYL) 25 MG tablet, Take 25-50 mg by mouth daily as needed for allergies. Reported on 02/12/2015, Disp: , Rfl:   Family History  Family History    Problem Relation Age of Onset  . Kidney failure Mother   . Ovarian cancer Mother   . Arthritis Mother   . Kidney disease Mother   . Hypertension Mother   . Breast cancer Mother   . Hypertension Father   . CVA Brother 55  . Hypertension Brother     Social History  Social History   Social History  . Marital Status: Divorced    Spouse Name: N/A  . Number of Children: N/A  . Years of Education: N/A   Occupational History  . CONSTRUCTION/MAINT     [employer]   Social History Main Topics  .  Smoking status: Former Smoker -- 1.00 packs/day for 20 years    Types: Cigarettes    Quit date: 05/14/1996  . Smokeless tobacco: Never Used     Comment: quit 1995  . Alcohol Use: 3.6 oz/week    6 Cans of beer per week  . Drug Use: No  . Sexual Activity: No   Other Topics Concern  . Not on file   Social History Narrative    Review of Systems, as per HPI, otherwise negative General:  No chills, fever, night sweats or weight changes.  Cardiovascular:  No chest pain, dyspnea on exertion, edema, orthopnea, palpitations, paroxysmal nocturnal dyspnea. Dermatological: No rash, lesions/masses Respiratory: No cough, dyspnea Urologic: No hematuria, dysuria Abdominal:   No nausea, vomiting, diarrhea, bright red blood per rectum, melena, or hematemesis Neurologic:  No visual changes, wkns, changes in mental status. All other systems reviewed and are otherwise negative except as noted above.  Physical Exam  Blood pressure 160/72, pulse 73, height 5' 9.5" (1.765 m), weight 234 lb (106.142 kg).  Orthostatics negative. General: Pleasant, NAD Psych: Normal affect. Neuro: Alert and oriented X 3. Moves all extremities spontaneously. HEENT: Normal  Neck: Supple without bruits or JVD. Lungs:  Resp regular and unlabored, CTA. Heart: RRR no s3, s4, or murmurs. Abdomen: Soft, non-tender, non-distended, BS + x 4.  Extremities: No clubbing, cyanosis or edema. DP/PT/Radials 2+ and equal  bilaterally.  Labs:  No results for input(s): CKTOTAL, CKMB, TROPONINI in the last 72 hours. Lab Results  Component Value Date   WBC 7.6 05/22/2014   HGB 14.7 05/22/2014   HCT 43.1 05/22/2014   MCV 91.9 05/22/2014   PLT 204 05/22/2014   No results for input(s): NA, K, CL, CO2, BUN, CREATININE, CALCIUM, PROT, BILITOT, ALKPHOS, ALT, AST, GLUCOSE in the last 168 hours.  Invalid input(s): LABALBU Lab Results  Component Value Date   CHOL 163 01/05/2015   HDL 41 01/05/2015   LDLCALC 94 01/05/2015   TRIG 140 01/05/2015   No results found for: DDIMER Invalid input(s): POCBNP  Accessory Clinical Findings  Echocardiogram 01/04/2012 Study Conclusions  - Left ventricle: The cavity size was normal. Wall thickness was increased in a pattern of mild LVH. Systolic function was normal. The estimated ejection fraction was in the range of 60% to 65%. Wall motion was normal; there were no regional wall motion abnormalities. Left ventricular diastolic function parameters were normal.  ECG - normal sinus rhythm, 83 beats per minute, nonspecific T wave abnormality.  Exercise nuclear stress test 12/12/2013 Exercise Capacity: Good exercise capacity. BP Response: Baseline HTN with Hypertensive blood pressure  response. DBP increased from 85 - 117 mmHg; SBP 157 - 193 mmHg Clinical Symptoms: The exercise was limited by dyspnea &  fatigue. No anginal pain.. ECG Impression: Significant ST abnormalities consistent with  ischemia. Comparison with Prior Nuclear Study: No images to compare LV Wall Motion: NL LV Function; NL Wall Motion  Overall Impression: Low to Intermediate risk stress nuclear  study Abnormal ECG portion of the Exercise Myoview with notable  ST depression concerning for ischemia along with PVCs in singlets & couplet that were more prominent . There is no Scintigraphic  finding to suggest ischemia, but the presence of significant  splanchnic attenuation makes the study less  interpretable.  ECG: SR, negative T waves in the inferolateral leads, new when compared to te ECG on 12/06/2012  Cardiac cath 06/10/2013 Left ventriculography: Left ventricular systolic function is normal, LVEF is estimated at 55-65%, there is no  significant mitral regurgitation   Final Conclusions:  1. Severe 3 vessel obstructive CAD 2. All grafts are patent including SVG to diagonal 2, SVG to OM 2, RIMA to RCA, and LIMA to LAD.  3. Normal LV function.   Recommendations: Recommend continued medical therapy. His grafts are functioning very well. The PDA is occluded but fills by collaterals. The LCx in the AV groove has severe disease but does not supply much myocardium.   Michael [name], [place]    Assessment & Plan  A very pleasant 60 year old male   1. CAD, s/p CABG in 1999, - the patient is presented with exertional dizziness and atypical nonexertional chest pain in 2015 and underwent a left cardiac cath in 06/2013 that showed all grafts are patent including SVG to diagonal 2, SVG to OM 2, RIMA to RCA, and LIMA to LAD.  Today there are no changes to his symptoms however his EKG shows new negative T waves in the lateral leads V4-6. His blood pressure is uncontrolled we'll try to improve and recheck his symptoms and EKG in 6 weeks.  2. Hypertension - uncontrolled recently his losartan was discontinued as there was concern that this might be causing angioedema. We will avoid ACEI or ARBs and start Imdur 30 mg daily.  3. PAD, s/p carotid endarterectomy, stable on duplex carotids on 06/2014.  4. Hyperlipidemia - at goal, repeat in 1 year  5. Impaired glucose tolerance - for now advise stools with.  Follow up in 6 weeks with ECG.   Michael Spark, MD, [facility] 02/12/2015, 8:57 AM

## 2015-02-18 ENCOUNTER — Encounter: Payer: Self-pay | Admitting: Cardiology

## 2015-02-25 MED ORDER — HYDRALAZINE HCL 25 MG PO TABS: 25.0000 mg | ORAL_TABLET | Freq: Three times a day (TID) | ORAL | Status: DC

## 2015-02-25 NOTE — Addendum Note (Signed)
Addended by: Nuala Alpha on: 02/25/2015 09:28 AM   Modules accepted: Orders, Medications

## 2015-02-25 NOTE — Telephone Encounter (Signed)
Hey Habib this is Karlene Einstein again. Dr Meda Coffee wanted me to endorse this message to you: Call Lavetta Geier or a triage nurse at our office by this Thursday, if headaches persist, and then we will switch you to hydralazine 25 mg po TID. PO TID means "by mouth 3 times daily." Hope this helps and your headaches stop. Let me know if this is an issue.

## 2015-02-25 NOTE — Telephone Encounter (Signed)
Spoke with the pt on the phone in regards to his mychart message from earlier this morning, stating that he wants to d/c imdur, for this causes him bad headaches, and start with Dr Francesca Oman other recommendation for him to switch to hydralazine 25 mg po TID.  Pt request that I send this to his local pharmacy [facility] for a month supply, before sending this to [facility], incase he isn't able to tolerate this med too.  Informed the pt that I will send in hydralazine 25 mg po TID to his requested local pharmacy.  Advised the pt that he should call our office back a week before he runs out of this medication, so we can send further refills to [facility] for a 90 day supply.  Pt verbalized understanding, agrees with this plan, and gracious for all the assistance provided.

## 2015-04-02 ENCOUNTER — Encounter: Payer: Self-pay | Admitting: Cardiology

## 2015-04-02 ENCOUNTER — Ambulatory Visit (INDEPENDENT_AMBULATORY_CARE_PROVIDER_SITE_OTHER): Payer: 59 | Admitting: Cardiology

## 2015-04-02 VITALS — BP 130/80 | HR 58 | Ht 70.0 in | Wt 236.4 lb

## 2015-04-02 DIAGNOSIS — E785 Hyperlipidemia, unspecified: Secondary | ICD-10-CM

## 2015-04-02 DIAGNOSIS — I10 Essential (primary) hypertension: Secondary | ICD-10-CM

## 2015-04-02 DIAGNOSIS — R9431 Abnormal electrocardiogram [ECG] [EKG]: Secondary | ICD-10-CM

## 2015-04-02 DIAGNOSIS — I2510 Atherosclerotic heart disease of native coronary artery without angina pectoris: Secondary | ICD-10-CM

## 2015-04-02 DIAGNOSIS — I2583 Coronary atherosclerosis due to lipid rich plaque: Secondary | ICD-10-CM

## 2015-04-02 MED ORDER — AMLODIPINE BESYLATE 10 MG PO TABS: 10.0000 mg | ORAL_TABLET | Freq: Every day | ORAL | Status: DC

## 2015-04-02 MED ORDER — HYDROCHLOROTHIAZIDE 25 MG PO TABS: 25.0000 mg | ORAL_TABLET | Freq: Every day | ORAL | Status: DC

## 2015-04-02 MED ORDER — HYDRALAZINE HCL 25 MG PO TABS: 25.0000 mg | ORAL_TABLET | Freq: Two times a day (BID) | ORAL | Status: DC

## 2015-04-02 MED ORDER — SIMVASTATIN 20 MG PO TABS: 20.0000 mg | ORAL_TABLET | Freq: Every day | ORAL | Status: DC

## 2015-04-02 MED ORDER — CARVEDILOL 25 MG PO TABS: 25.0000 mg | ORAL_TABLET | Freq: Two times a day (BID) | ORAL | Status: DC

## 2015-04-02 NOTE — Progress Notes (Signed)
Patient ID: Michael [name], male   DOB: [birth year], 60 y.o.   MRN: [id number]    Patient Name: Michael [name] Date of Encounter: 04/02/2015  Primary Care Provider:  Mickie Hillier, MD Primary Cardiologist:  Michael [name]   Problem List   Past Medical History  Diagnosis Date  . Coronary atherosclerosis of artery bypass graft   . Other and unspecified hyperlipidemia     takes Simvastatin daily  . Gout     takes Allopurinol daily  . Allergy   . CAD (coronary artery disease)   . Peripheral vascular disease ([place])   . Malignant hyperthermia     Potential but not Confirmed  . Essential hypertension, benign     Takes Amlodipine and Losartan-HCTZ daily;takes Coreg daily  . Anginal pain ([place])     last time a yr ago  . Back pain   . Neck pain     yrs ago and saw a chiropractor occasionally  . GERD (gastroesophageal reflux disease)     was on Prilosec;is not currently on any meds  . Urinary urgency   . Bruises easily    Past Surgical History  Procedure Laterality Date  . Ankle fracture surgery Left 1981  . Colonoscopy    . Inguinal hernia repair Bilateral Aug. 2005  . Coronary artery bypass graft  1999    x 4  . Fracture surgery  1980    screws placed and in 1981 screws removed(this is when was told about potential for MH  . Esophagogastroduodenoscopy    . Cystoscopy    . Endarterectomy Left 04/29/2012    Procedure: ENDARTERECTOMY CAROTID;  Surgeon: Rosetta Posner, MD;  Location: [place];  Service: Vascular;  Laterality: Left;  with primary closure of artery  . Cardiac catheterization  2015  . Colonoscopy N/A 11/21/2013    Procedure: COLONOSCOPY;  Surgeon: Danie Binder, MD;  [facility];  Service: Endoscopy;  Laterality: N/A;  1:15 PM  . Left heart catheterization with coronary/graft angiogram N/A 06/10/2013    Procedure: LEFT HEART CATHETERIZATION WITH Beatrix Fetters;  Surgeon: Peter M Martinique, MD;  Location: [facility] CATH [facility];  Service: Cardiovascular;  Laterality:  N/A;  . Carotid endarterectomy Left 04-29-12    cea    Allergies  Allergies  Allergen Reactions  . Anesthetics, Amide Anaphylaxis  . Ramipril Cough  . Losartan     HPI  [name] is a former patient of Dr Verl Blalock. He has h/o CAD, CABG in 1999, PAD, s/p left carotid endarterectomy in April 2014, hypertension and hyperlipidemia. This is his 1 year follow up. He denies any chest pain and feels SOB on strenuous exercise. He underwent carotid endarterectomy for symptoms of dizziness and neck tingling that has resolved partially. He still feels dizzy sometimes. He denies palpitations, orthopnea, PND or syncope. He has gained weight, the last year he started to exercise daily, but with symptoms of dizziness and recent surgery he stopped. He is motivated to do so. He states that sometimes he develops feet edema.  The patient was scheduled for 1 year follow up but is coming after 6 months and is reporting dizziness especially after he stands up but can occur while walking for a while as well. No syncope. He is also experiencing retrosternal tightness that is not exertional, the last one a week ago that persisted for hours. No obvious exacerbating or alleviating factors. No palpitations, no LE edema, orthopnea or PND.   01/16/14 - the patient is  coming after 6 months, he underwent left cardiac cath in June 2015 for abnormal stress test. The cath showed severe native 3VD and all bypass vessels patent.   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 Since the last week he is he has bellow intermittent facial swelling and tongue swelling and underwent extensive workup, and seems to be responding to antihistamines. He has gained weight, and has impaired glucose tolerance now.  04/02/2015 - 2 months follow up, at the last visit we added Imdur 30 to improve BP  control. He also had new negative T waves in his ECG in the lateral leads V4-6. He experienced headaches from imdur, switched to hydralazine 25 mg po TID, he is busy and takes it BID, but his BP is still controlled. Denies chest pain, DOE, LE edema. No palpitations or syncope.   Home Medications  Current outpatient prescriptions:  .  allopurinol (ZYLOPRIM) 300 MG tablet, Take 1 tablet (300 mg total) by mouth daily., Disp: 90 tablet, Rfl: 1 .  amLODipine (NORVASC) 10 MG tablet, Take 1 tablet (10 mg total) by mouth daily., Disp: 90 tablet, Rfl: 3 .  aspirin 325 MG tablet, Take 325 mg by mouth daily., Disp: , Rfl:  .  budesonide (RHINOCORT AQUA) 32 MCG/ACT nasal spray, Place 1 spray into both nostrils daily as needed. , Disp: , Rfl:  .  carvedilol (COREG) 25 MG tablet, Take 1 tablet (25 mg total) by mouth 2 (two) times daily with a meal., Disp: 180 tablet, Rfl: 3 .  cetirizine (ZYRTEC) 10 MG tablet, Take 10 mg by mouth daily., Disp: , Rfl:  .  diphenhydrAMINE (BENADRYL) 25 MG tablet, Take 25-50 mg by mouth daily as needed for allergies. Reported on 02/12/2015, Disp: , Rfl:  .  EPIPEN 2-PAK 0.3 MG/0.3ML SOAJ injection, , Disp: , Rfl:  .  famotidine (PEPCID) 20 MG tablet, Take 1 tablet (20 mg total) by mouth 2 (two) times daily., Disp: 30 tablet, Rfl: 0 .  hydrALAZINE (APRESOLINE) 25 MG tablet, Take 1 tablet (25 mg total) by mouth 3 (three) times daily., Disp: 90 tablet, Rfl: 0 .  hydrochlorothiazide (HYDRODIURIL) 25 MG tablet, Take 1 tablet (25 mg total) by mouth daily., Disp: 90 tablet, Rfl: 3 .  indomethacin (INDOCIN) 25 MG capsule, Take 25 mg by mouth 2 (two) times daily as needed (Gout pain)., Disp: , Rfl:  .  Multiple Vitamin (MULTI-VITAMIN DAILY PO), Take 1 tablet by mouth daily. , Disp: , Rfl:  .  SALINE NASAL MIST NA, Place 1 spray into both nostrils daily as needed (allergies)., Disp: , Rfl:  .  simvastatin (ZOCOR) 20 MG tablet, Take 1 tablet (20 mg total) by mouth at bedtime., Disp: 90  tablet, Rfl: 3 .  tamsulosin (FLOMAX) 0.4 MG CAPS capsule, Take 1 tablet daily at bedtime., Disp: 30 capsule, Rfl: 11  Family History  Family History  Problem Relation Age of Onset  . Kidney failure Mother   . Ovarian cancer Mother   . Arthritis Mother   . Kidney disease Mother   . Hypertension Mother   . Breast cancer Mother   . Hypertension Father   . CVA Brother 51  . Hypertension Brother     Social History  Social History   Social History  . Marital Status: Divorced    Spouse Name: N/A  .  Number of Children: N/A  . Years of Education: N/A   Occupational History  . CONSTRUCTION/MAINT     [employer]   Social History Main Topics  . Smoking status: Former Smoker -- 1.00 packs/day for 20 years    Types: Cigarettes    Quit date: 05/14/1996  . Smokeless tobacco: Never Used     Comment: quit 1995  . Alcohol Use: 3.6 oz/week    6 Cans of beer per week  . Drug Use: No  . Sexual Activity: No   Other Topics Concern  . Not on file   Social History Narrative    Review of Systems, as per HPI, otherwise negative General:  No chills, fever, night sweats or weight changes.  Cardiovascular:  No chest pain, dyspnea on exertion, edema, orthopnea, palpitations, paroxysmal nocturnal dyspnea. Dermatological: No rash, lesions/masses Respiratory: No cough, dyspnea Urologic: No hematuria, dysuria Abdominal:   No nausea, vomiting, diarrhea, bright red blood per rectum, melena, or hematemesis Neurologic:  No visual changes, wkns, changes in mental status. All other systems reviewed and are otherwise negative except as noted above.  Physical Exam  There were no vitals taken for this visit.  Orthostatics negative. General: Pleasant, NAD Psych: Normal affect. Neuro: Alert and oriented X 3. Moves all extremities spontaneously. HEENT: Normal  Neck: Supple without bruits or JVD. Lungs:  Resp regular and unlabored, CTA. Heart: RRR no s3, s4, or murmurs. Abdomen: Soft,  non-tender, non-distended, BS + x 4.  Extremities: No clubbing, cyanosis or edema. DP/PT/Radials 2+ and equal bilaterally.  Labs:  No results for input(s): CKTOTAL, CKMB, TROPONINI in the last 72 hours. Lab Results  Component Value Date   WBC 7.6 05/22/2014   HGB 14.7 05/22/2014   HCT 43.1 05/22/2014   MCV 91.9 05/22/2014   PLT 204 05/22/2014   No results for input(s): NA, K, CL, CO2, BUN, CREATININE, CALCIUM, PROT, BILITOT, ALKPHOS, ALT, AST, GLUCOSE in the last 168 hours.  Invalid input(s): LABALBU Lab Results  Component Value Date   CHOL 163 01/05/2015   HDL 41 01/05/2015   LDLCALC 94 01/05/2015   TRIG 140 01/05/2015   No results found for: DDIMER Invalid input(s): POCBNP  Accessory Clinical Findings  Echocardiogram 01/04/2012 Study Conclusions  - Left ventricle: The cavity size was normal. Wall thickness was increased in a pattern of mild LVH. Systolic function was normal. The estimated ejection fraction was in the range of 60% to 65%. Wall motion was normal; there were no regional wall motion abnormalities. Left ventricular diastolic function parameters were normal.  ECG - normal sinus rhythm, 83 beats per minute, nonspecific T wave abnormality.  Exercise nuclear stress test 12/12/2013 Exercise Capacity: Good exercise capacity. BP Response: Baseline HTN with Hypertensive blood pressure  response. DBP increased from 85 - 117 mmHg; SBP 157 - 193 mmHg Clinical Symptoms: The exercise was limited by dyspnea &  fatigue. No anginal pain.. ECG Impression: Significant ST abnormalities consistent with  ischemia. Comparison with Prior Nuclear Study: No images to compare LV Wall Motion: NL LV Function; NL Wall Motion  Overall Impression: Low to Intermediate risk stress nuclear  study Abnormal ECG portion of the Exercise Myoview with notable  ST depression concerning for ischemia along with PVCs in singlets & couplet that were more prominent . There is no Scintigraphic    finding to suggest ischemia, but the presence of significant  splanchnic attenuation makes the study less interpretable.  ECG: SR, negative T waves in the inferolateral leads, new when  compared to te ECG on 12/06/2012  Cardiac cath 06/10/2013 Left ventriculography: Left ventricular systolic function is normal, LVEF is estimated at 55-65%, there is no significant mitral regurgitation   Final Conclusions:  1. Severe 3 vessel obstructive CAD 2. All grafts are patent including SVG to diagonal 2, SVG to OM 2, RIMA to RCA, and LIMA to LAD.  3. Normal LV function.   Recommendations: Recommend continued medical therapy. His grafts are functioning very well. The PDA is occluded but fills by collaterals. The LCx in the AV groove has severe disease but does not supply much myocardium.   Peter M Martinique, [place]    Assessment & Plan  A very pleasant 60 year old male   1. CAD, s/p CABG in 1999, - the patient is presented with exertional dizziness and atypical nonexertional chest pain in 2015 and underwent a left cardiac cath in 06/2013 that showed all grafts are patent including SVG to diagonal 2, SVG to OM 2, RIMA to RCA, and LIMA to LAD.  Today there are no changes to his symptoms however his EKG shows new negative T waves in the lateral leads V4-6. T wave abnormalities in the lateral leads improved - now only non-specific changes with improved BP.  2. Hypertension -  We will avoid ACEI or ARBs and start Imdur 30 mg daily (? Angioedema), controlled with hydralazine 25 mg po BID (TID not practical with work).  3. PAD, s/p carotid endarterectomy, stable on duplex carotids on 06/2014.  4. Hyperlipidemia - at goal, repeat in 1 year  5. Impaired glucose tolerance - for now advise stools with.  Follow up in 6 months with ECG.   Michael Spark, MD, [facility] 04/02/2015, 7:04 AM

## 2015-04-02 NOTE — Patient Instructions (Signed)
Medication Instructions:   START TAKING HYDRALAZINE 25 MG TWICE DAILY    Follow-Up:  Your physician wants you to follow-up in: [place] will receive a reminder letter in the mail two months in advance. If you don't receive a letter, please call our office to schedule the follow-up appointment.      If you need a refill on your cardiac medications before your next appointment, please call your pharmacy.

## 2015-06-08 ENCOUNTER — Encounter: Payer: Self-pay | Admitting: Allergy and Immunology

## 2015-06-08 ENCOUNTER — Ambulatory Visit (INDEPENDENT_AMBULATORY_CARE_PROVIDER_SITE_OTHER): Payer: 59 | Admitting: Allergy and Immunology

## 2015-06-08 VITALS — BP 152/88 | HR 72 | Resp 18

## 2015-06-08 DIAGNOSIS — T7840XD Allergy, unspecified, subsequent encounter: Secondary | ICD-10-CM | POA: Diagnosis not present

## 2015-06-08 DIAGNOSIS — J3089 Other allergic rhinitis: Secondary | ICD-10-CM

## 2015-06-08 MED ORDER — EPINEPHRINE 0.3 MG/0.3ML IJ SOAJ: INTRAMUSCULAR | Status: DC

## 2015-06-08 NOTE — Progress Notes (Signed)
Follow-up Note  Referring Provider: Mikey Kirschner, MD Primary Provider: Mickie Hillier, MD Date of Office Visit: 06/08/2015  Subjective:   Michael [name] (DOB: 01/01/1956) is a 60 y.o. male who returns to the [facility] on 06/08/2015 in re-evaluation of the following:  HPI: Michael [name] presents this clinic in reevaluation of his recurrent allergic reactions manifested as recurrent swelling and allergic rhinitis. He has not had any episodes of swelling since I've seen him in this clinic 6 months ago. His nose is been doing relatively well but he has developed some congestion especially in the morning. He is not using his nasal steroid very often but he does consistently use his Zyrtec and Pepcid.    Medication List           allopurinol 300 MG tablet  Commonly known as:  ZYLOPRIM  Take 1 tablet (300 mg total) by mouth daily.     amLODipine 10 MG tablet  Commonly known as:  NORVASC  Take 1 tablet (10 mg total) by mouth daily.     aspirin 325 MG tablet  Take 325 mg by mouth daily.     carvedilol 25 MG tablet  Commonly known as:  COREG  Take 1 tablet (25 mg total) by mouth 2 (two) times daily with a meal.     cetirizine 10 MG tablet  Commonly known as:  ZYRTEC  Take 10 mg by mouth daily.     diphenhydrAMINE 25 MG tablet  Commonly known as:  BENADRYL  Take 25-50 mg by mouth daily as needed for allergies. Reported on 02/12/2015     EPIPEN 2-PAK 0.3 mg/0.3 mL Soaj injection  Generic drug:  EPINEPHrine  Inject 0.3 mg as directed as needed (allergic reaction).     famotidine 20 MG tablet  Commonly known as:  PEPCID  Take 1 tablet (20 mg total) by mouth 2 (two) times daily.     hydrALAZINE 25 MG tablet  Commonly known as:  APRESOLINE  Take 1 tablet (25 mg total) by mouth 2 (two) times daily.     hydrochlorothiazide 25 MG tablet  Commonly known as:  HYDRODIURIL  Take 1 tablet (25 mg total) by mouth daily.     HYDROcodone-acetaminophen 5-325 MG tablet    Commonly known as:  NORCO/VICODIN  Take 1 tablet by mouth every 4 (four) hours as needed for moderate pain or severe pain.     indomethacin 25 MG capsule  Commonly known as:  INDOCIN  Take 25 mg by mouth 2 (two) times daily as needed (Gout pain).     MULTI-VITAMIN DAILY PO  Take 1 tablet by mouth daily.     penicillin v potassium 500 MG tablet  Commonly known as:  VEETID  Take 500 mg by mouth 2 (two) times daily.     RHINOCORT AQUA 32 MCG/ACT nasal spray  Generic drug:  budesonide  Place 1 spray into both nostrils daily as needed.     SALINE NASAL MIST NA  Place 1 spray into both nostrils daily as needed (allergies).     simvastatin 20 MG tablet  Commonly known as:  ZOCOR  Take 1 tablet (20 mg total) by mouth at bedtime.     tamsulosin 0.4 MG Caps capsule  Commonly known as:  FLOMAX  Take 1 tablet daily at bedtime.        Past Medical History  Diagnosis Date  . Coronary atherosclerosis of artery bypass graft   . Other and  unspecified hyperlipidemia     takes Simvastatin daily  . Gout     takes Allopurinol daily  . Allergy   . CAD (coronary artery disease)   . Peripheral vascular disease ([place])   . Malignant hyperthermia     Potential but not Confirmed  . Essential hypertension, benign     Takes Amlodipine and Losartan-HCTZ daily;takes Coreg daily  . Anginal pain ([place])     last time a yr ago  . Back pain   . Neck pain     yrs ago and saw a chiropractor occasionally  . GERD (gastroesophageal reflux disease)     was on Prilosec;is not currently on any meds  . Urinary urgency   . Bruises easily     Past Surgical History  Procedure Laterality Date  . Ankle fracture surgery Left 1981  . Colonoscopy    . Inguinal hernia repair Bilateral Aug. 2005  . Coronary artery bypass graft  1999    x 4  . Fracture surgery  1980    screws placed and in 1981 screws removed(this is when was told about potential for MH  . Esophagogastroduodenoscopy    . Cystoscopy    .  Endarterectomy Left 04/29/2012    Procedure: ENDARTERECTOMY CAROTID;  Surgeon: Rosetta Posner, MD;  Location: [place];  Service: Vascular;  Laterality: Left;  with primary closure of artery  . Cardiac catheterization  2015  . Colonoscopy N/A 11/21/2013    Procedure: COLONOSCOPY;  Surgeon: Danie Binder, MD;  [facility];  Service: Endoscopy;  Laterality: N/A;  1:15 PM  . Left heart catheterization with coronary/graft angiogram N/A 06/10/2013    Procedure: LEFT HEART CATHETERIZATION WITH Beatrix Fetters;  Surgeon: Peter M Martinique, MD;  Location: [facility] CATH LAB;  Service: Cardiovascular;  Laterality: N/A;  . Carotid endarterectomy Left 04-29-12    cea    Allergies  Allergen Reactions  . Anesthetics, Amide Anaphylaxis  . Ramipril Cough  . Losartan     Review of systems negative except as noted in HPI / PMHx or noted below:  Review of Systems  Constitutional: Negative.   HENT: Negative.   Eyes: Negative.   Respiratory: Negative.   Cardiovascular: Negative.   Gastrointestinal: Negative.   Genitourinary: Negative.   Musculoskeletal: Negative.   Skin: Negative.   Neurological: Negative.   Endo/Heme/Allergies: Negative.   Psychiatric/Behavioral: Negative.      Objective:   Filed Vitals:   06/08/15 1647  BP: 152/88  Pulse: 72  Resp: 18          Physical Exam  Constitutional: He is well-developed, well-nourished, and in no distress.  HENT:  Head: Normocephalic.  Right Ear: Tympanic membrane, external ear and ear canal normal.  Left Ear: Tympanic membrane, external ear and ear canal normal.  Nose: Nose normal. No mucosal edema or rhinorrhea.  Mouth/Throat: Uvula is midline, oropharynx is clear and moist and mucous membranes are normal. No oropharyngeal exudate.  Suggestion of left nasal polyp deep in nasal airway  Eyes: Conjunctivae are normal.  Neck: Trachea normal. No tracheal tenderness present. No tracheal deviation present. No thyromegaly present.    Cardiovascular: Normal rate, regular rhythm, S1 normal, S2 normal and normal heart sounds.   No murmur heard. Pulmonary/Chest: Breath sounds normal. No stridor. No respiratory distress. He has no wheezes. He has no rales.  Musculoskeletal: He exhibits no edema.  Lymphadenopathy:       Head (right side): No tonsillar adenopathy present.  Head (left side): No tonsillar adenopathy present.    He has no cervical adenopathy.  Neurological: He is alert. Gait normal.  Skin: No rash noted. He is not diaphoretic. No erythema. Nails show no clubbing.  Psychiatric: Mood and affect normal.    Diagnostics: None     Assessment and Plan:   1. Allergic reaction, subsequent encounter   2. Other allergic rhinitis     1. Continue Zyrtec 10 mg and Pepcid 10 mg daily  2. Continue Rhinocort one spray each nostril 3-7 times per week  3. Continue EpiPen if needed  4. Return in 12 months or earlier if problem  Jazarion appears to be doing very well at this point in time while consistently using his Zyrtec and Pepcid. He certainly has the option of discontinuing these agents to see if he continues to do well but he is not very interested in manipulating his medical treatment at this point given the fact that his plan has work so well. I've encouraged him to use a little bit more Rhinocort for his nasal congestion. There is the suggestion that he may have a nasal polyp deep in his left nasal airway. He will contact me should he note that he is not doing well while using Rhinocort. If he does well I will see him back in his clinic in 12 months or earlier if there is a problem.  Allena Katz, MD [place]

## 2015-06-08 NOTE — Patient Instructions (Addendum)
  1. Continue Zyrtec 10 mg and Pepcid 10 mg daily  2. Continue Rhinocort one spray each nostril 3-7 times per week  3. Continue EpiPen if needed  4. Return in 12 months or earlier if problem

## 2015-06-10 ENCOUNTER — Encounter: Payer: Self-pay | Admitting: Family

## 2015-06-15 ENCOUNTER — Ambulatory Visit (INDEPENDENT_AMBULATORY_CARE_PROVIDER_SITE_OTHER): Payer: 59 | Admitting: Family

## 2015-06-15 ENCOUNTER — Encounter: Payer: Self-pay | Admitting: Family

## 2015-06-15 ENCOUNTER — Ambulatory Visit (HOSPITAL_COMMUNITY)
Admission: RE | Admit: 2015-06-15 | Discharge: 2015-06-15 | Disposition: A | Discharge status: Home / Self Care | Payer: 59 | Source: Ambulatory Visit | Attending: Family | Admitting: Family

## 2015-06-15 VITALS — BP 130/84 | HR 78 | Temp 97.9°F | Resp 16 | Ht 70.0 in | Wt 232.0 lb

## 2015-06-15 DIAGNOSIS — I2510 Atherosclerotic heart disease of native coronary artery without angina pectoris: Secondary | ICD-10-CM | POA: Insufficient documentation

## 2015-06-15 DIAGNOSIS — I10 Essential (primary) hypertension: Secondary | ICD-10-CM | POA: Diagnosis not present

## 2015-06-15 DIAGNOSIS — K219 Gastro-esophageal reflux disease without esophagitis: Secondary | ICD-10-CM | POA: Diagnosis not present

## 2015-06-15 DIAGNOSIS — Z48812 Encounter for surgical aftercare following surgery on the circulatory system: Secondary | ICD-10-CM | POA: Diagnosis not present

## 2015-06-15 DIAGNOSIS — Z87891 Personal history of nicotine dependence: Secondary | ICD-10-CM

## 2015-06-15 DIAGNOSIS — I6522 Occlusion and stenosis of left carotid artery: Secondary | ICD-10-CM | POA: Diagnosis not present

## 2015-06-15 DIAGNOSIS — Z9889 Other specified postprocedural states: Secondary | ICD-10-CM

## 2015-06-15 DIAGNOSIS — I6521 Occlusion and stenosis of right carotid artery: Secondary | ICD-10-CM | POA: Insufficient documentation

## 2015-06-15 NOTE — Progress Notes (Signed)
Chief Complaint: Follow up Extracranial Carotid Artery Stenosis   History of Present Illness  Michael [name] is a 60 y.o. male patient of Dr. Donnetta Hutching who is s/p left CEA on 04/29/12. He returns today for routine follow up. He has no history of stroke or TIA.  Pt denies claudication symptoms with walking, denies tingling, numbness, or pain in either arm.  Pt denies New Medical or Surgical History.  Pt Diabetic: No Pt smoker: former smoker, quit in 1995  Pt meds include: Statin : Yes ASA: Yes Other anticoagulants/antiplatelets: no    Past Medical History  Diagnosis Date  . Coronary atherosclerosis of artery bypass graft   . Other and unspecified hyperlipidemia     takes Simvastatin daily  . Gout     takes Allopurinol daily  . Allergy   . CAD (coronary artery disease)   . Peripheral vascular disease ([place])   . Malignant hyperthermia     Potential but not Confirmed  . Essential hypertension, benign     Takes Amlodipine and Losartan-HCTZ daily;takes Coreg daily  . Anginal pain ([place])     last time a yr ago  . Back pain   . Neck pain     yrs ago and saw a chiropractor occasionally  . GERD (gastroesophageal reflux disease)     was on Prilosec;is not currently on any meds  . Urinary urgency   . Bruises easily     Social History Social History  Substance Use Topics  . Smoking status: Former Smoker -- 1.00 packs/day for 20 years    Types: Cigarettes    Quit date: 05/14/1996  . Smokeless tobacco: Never Used     Comment: quit 1995  . Alcohol Use: 3.6 oz/week    6 Cans of beer per week    Family History Family History  Problem Relation Age of Onset  . Kidney failure Mother   . Ovarian cancer Mother   . Arthritis Mother   . Kidney disease Mother   . Hypertension Mother   . Breast cancer Mother   . Hypertension Father   . CVA Brother 64  . Hypertension Brother     Surgical History Past Surgical History  Procedure Laterality Date  . Ankle fracture surgery  Left 1981  . Colonoscopy    . Inguinal hernia repair Bilateral Aug. 2005  . Coronary artery bypass graft  1999    x 4  . Fracture surgery  1980    screws placed and in 1981 screws removed(this is when was told about potential for MH  . Esophagogastroduodenoscopy    . Cystoscopy    . Endarterectomy Left 04/29/2012    Procedure: ENDARTERECTOMY CAROTID;  Surgeon: Rosetta Posner, MD;  Location: [facility];  Service: Vascular;  Laterality: Left;  with primary closure of artery  . Cardiac catheterization  2015  . Colonoscopy N/A 11/21/2013    Procedure: COLONOSCOPY;  Surgeon: Danie Binder, MD;  [facility];  Service: Endoscopy;  Laterality: N/A;  1:15 PM  . Left heart catheterization with coronary/graft angiogram N/A 06/10/2013    Procedure: LEFT HEART CATHETERIZATION WITH Beatrix Fetters;  Surgeon: Peter M Martinique, MD;  Location: [facility] CATH LAB;  Service: Cardiovascular;  Laterality: N/A;  . Carotid endarterectomy Left 04-29-12    cea    Allergies  Allergen Reactions  . Anesthetics, [place]ide Anaphylaxis  . Ramipril Cough  . Losartan     Current Outpatient Prescriptions  Medication Sig Dispense Refill  . allopurinol (ZYLOPRIM)  300 MG tablet Take 1 tablet (300 mg total) by mouth daily. 90 tablet 1  . amLODipine (NORVASC) 10 MG tablet Take 1 tablet (10 mg total) by mouth daily. 90 tablet 6  . aspirin 325 MG tablet Take 325 mg by mouth daily.    . budesonide (RHINOCORT AQUA) 32 MCG/ACT nasal spray Place 1 spray into both nostrils daily as needed.     . carvedilol (COREG) 25 MG tablet Take 1 tablet (25 mg total) by mouth 2 (two) times daily with a meal. 180 tablet 6  . cetirizine (ZYRTEC) 10 MG tablet Take 10 mg by mouth daily.    Marland Kitchen EPINEPHrine 0.3 mg/0.3 mL IJ SOAJ injection Use as directed for life-threatening allergic reaction. 2 Device 3  . EPIPEN 2-PAK 0.3 MG/0.3ML SOAJ injection Inject 0.3 mg as directed as needed (allergic reaction).     . famotidine (PEPCID) 10 MG tablet Take  10 mg by mouth 2 (two) times daily.    . hydrALAZINE (APRESOLINE) 25 MG tablet Take 1 tablet (25 mg total) by mouth 2 (two) times daily. 180 tablet 6  . hydrochlorothiazide (HYDRODIURIL) 25 MG tablet Take 1 tablet (25 mg total) by mouth daily. 90 tablet 6  . Multiple Vitamin (MULTI-VITAMIN DAILY PO) Take 1 tablet by mouth daily.     Marland Kitchen SALINE NASAL MIST NA Place 1 spray into both nostrils daily as needed (allergies).    . simvastatin (ZOCOR) 20 MG tablet Take 1 tablet (20 mg total) by mouth at bedtime. 90 tablet 6  . tamsulosin (FLOMAX) 0.4 MG CAPS capsule Take 1 tablet daily at bedtime. 30 capsule 11   No current facility-administered medications for this visit.    Review of Systems : See HPI for pertinent positives and negatives.  Physical Examination  Filed Vitals:   06/15/15 1108 06/15/15 1111  BP: 137/78 130/84  Pulse: 90 78  Temp: 97.9 F (36.6 C)   Resp: 16   Height: 5\' 10"  (1.778 m)   Weight: 232 lb (105.235 kg)   SpO2: 94%    Body mass index is 33.29 kg/(m^2).  General: WDWN obese male in NAD GAIT: normal Eyes: PERRLA Pulmonary: Respirations are non-labored, CTAB Cardiac: regular rhythm, no detected murmur.  VASCULAR EXAM Carotid Bruits Left Right   Negative Negative   Radial pulses are 2+ palpable and equal.      Gastrointestinal: soft, nontender, BS WNL, no r/g, no palpable  masses.  Musculoskeletal: No muscle atrophy/wasting. M/S 5/5 throughout, Extremities without ischemic changes.  Neurologic: A&O X 3; Appropriate Affect ; SENSATION ;normal;  Speech is normal CN 2-12 intact, Pain and light touch intact in extremities, Motor exam as listed above.                Non-Invasive Vascular Imaging CAROTID DUPLEX 06/15/2015   CEREBROVASCULAR DUPLEX EVALUATION    INDICATION: Carotid artery disease    PREVIOUS  INTERVENTION(S): Left carotid endarterectomy 04/29/2012    DUPLEX EXAM: Carotid duplex    RIGHT  LEFT  Peak Systolic Velocities (cm/s) End Diastolic Velocities (cm/s) Plaque LOCATION Peak Systolic Velocities (cm/s) End Diastolic Velocities (cm/s) Plaque  129 24  CCA PROXIMAL 115 25   118 31  CCA MID 114 29   106 31  CCA DISTAL 85 24   124 17  ECA 126 15   85 30 HT ICA PROXIMAL 90 33 HM  68 25  ICA MID 81 34   82 32  ICA DISTAL 83 29     0.72  ICA / CCA Ratio (PSV) 0.78  Antegrade Vertebral Flow Antegrade  - Brachial Systolic Pressure (mmHg) -  Triphasic Brachial Artery Waveforms Triphasic    Plaque Morphology:  HM = Homogeneous, HT = Heterogeneous, CP = Calcific Plaque, SP = Smooth Plaque, IP = Irregular Plaque     ADDITIONAL FINDINGS: Multiphasic subclavian arteries    IMPRESSION: 1. Less than 40% right internal carotid artery stenosis 2. Patent left carotid endarterectomy site with no evidence for restenosis    Compared to the previous exam:  No change since exam of 06/10/2014      Assessment: Michael [name] is a 60 y.o. male who is s/p left CEA on 04/29/12. He has no history of stroke or TIA. Today's carotid duplex suggests less than 40% right internal carotid artery stenosis. Patent left carotid endarterectomy site with no evidence for restenosis. No change since exam of 06/10/2014.  Plan: Follow-up in 1 year with Carotid Duplex scan.   I discussed in depth with the patient the nature of atherosclerosis, and emphasized the importance of maximal medical management including strict control of blood pressure, blood glucose, and lipid levels, obtaining regular exercise, and continued cessation of smoking.  The patient is aware that without maximal medical management the underlying atherosclerotic disease process will progress, limiting the benefit of any interventions. The patient was given information about stroke prevention and what symptoms should prompt the patient to seek  immediate medical care. Thank you for allowing [place] to participate in this patient's care.  Clemon Chambers, RN, MSN, FNP-C [facility] Office: [phone]  Clinic Physician: Early  06/15/2015 11:34 AM

## 2015-07-27 ENCOUNTER — Encounter: Payer: Self-pay | Admitting: Allergy and Immunology

## 2015-07-27 ENCOUNTER — Encounter: Payer: Self-pay | Admitting: Cardiology

## 2015-07-29 NOTE — Addendum Note (Signed)
Addended by: Thresa Ross C on: 07/29/2015 11:27 AM   Modules accepted: Orders

## 2015-07-30 ENCOUNTER — Other Ambulatory Visit: Payer: Self-pay | Admitting: *Deleted

## 2015-07-30 MED ORDER — PREDNISONE 10 MG PO TABS: 10.0000 mg | ORAL_TABLET | Freq: Every day | ORAL | 0 refills | Status: AC

## 2015-08-02 ENCOUNTER — Other Ambulatory Visit: Payer: Self-pay | Admitting: Cardiology

## 2015-08-03 ENCOUNTER — Other Ambulatory Visit: Payer: Self-pay

## 2015-08-04 ENCOUNTER — Other Ambulatory Visit: Payer: Self-pay | Admitting: *Deleted

## 2015-08-04 ENCOUNTER — Encounter: Payer: Self-pay | Admitting: Cardiology

## 2015-08-04 MED ORDER — ALLOPURINOL 300 MG PO TABS: 300.0000 mg | ORAL_TABLET | Freq: Every day | ORAL | 2 refills | Status: DC

## 2015-08-04 NOTE — Telephone Encounter (Signed)
**Note De-Identified Mahonri Seiden Obfuscation** Please refer to the pts PCP as this medication is for gout. Thanks.

## 2015-09-15 ENCOUNTER — Other Ambulatory Visit: Payer: Self-pay | Admitting: Allergy and Immunology

## 2015-12-11 ENCOUNTER — Other Ambulatory Visit: Payer: Self-pay | Admitting: Family Medicine

## 2015-12-23 ENCOUNTER — Ambulatory Visit (INDEPENDENT_AMBULATORY_CARE_PROVIDER_SITE_OTHER): Payer: 59 | Admitting: Nurse Practitioner

## 2015-12-23 ENCOUNTER — Encounter: Payer: Self-pay | Admitting: Nurse Practitioner

## 2015-12-23 VITALS — BP 132/80 | Temp 98.1°F | Ht 69.5 in | Wt 244.2 lb

## 2015-12-23 DIAGNOSIS — Z91018 Allergy to other foods: Secondary | ICD-10-CM | POA: Diagnosis not present

## 2015-12-23 DIAGNOSIS — K9049 Malabsorption due to intolerance, not elsewhere classified: Secondary | ICD-10-CM

## 2015-12-23 DIAGNOSIS — R21 Rash and other nonspecific skin eruption: Secondary | ICD-10-CM | POA: Diagnosis not present

## 2015-12-23 MED ORDER — BETAMETHASONE DIPROPIONATE 0.05 % EX CREA: TOPICAL_CREAM | Freq: Two times a day (BID) | CUTANEOUS | 0 refills | Status: DC

## 2015-12-23 MED ORDER — PREDNISONE 20 MG PO TABS: ORAL_TABLET | ORAL | 0 refills | Status: DC

## 2015-12-23 NOTE — Patient Instructions (Signed)
claritin (loratadine 10 mg) or Allegra Benadryl 25 mg  pepcid

## 2015-12-24 ENCOUNTER — Encounter: Payer: Self-pay | Admitting: Nurse Practitioner

## 2015-12-24 NOTE — Progress Notes (Signed)
Subjective:  Presents for c/o rash on the left calf area x 1 year. Very pruritic at times. Always there but worse at times. Has tried antifungal cream, hydrocortisone cream and topical antibiotic. Also has a diffuse rash for the past several days. Pruritic. Has been seen by allergy specialist for angioedema. Has epi pen. Has had pin prick testing for allergies. Has been told that he may be allergic to ingestion of meats from mammals. Has not had alpha gal testing. Eats a large amount of beef. No known tick bite. No fever. No edema. No difficulty breathing or swallowing. No wheezing.  Objective:   BP 132/80   Temp 98.1 F (36.7 C) (Oral)   Ht 5' 9.5" (1.765 m)   Wt 244 lb 4 oz (110.8 kg)   BMI 35.55 kg/m  NAD. Alert, oriented. TMs clear effusion. Pharynx clear. Neck supple with mild anterior adenopathy. Lungs clear. Heart RRR. Well defined moderately erythematous circular lesion posterior left calf area. Nontender to palpation. Scaly in the center with fine papular rash surrounding. Also patient has multiple discrete fine dark pink papules over a good portion of his body.   Assessment: Rash and nonspecific skin eruption  Intolerance of ingested protein - Plan: Alpha-Gal Panel  Allergy to meat - Plan: Alpha-Gal Panel  Plan: Labs pending. Meds ordered this encounter  Medications  . predniSONE (DELTASONE) 20 MG tablet    Sig: 3 po qd x 3 d then 2 po qd x 3 d then 1 po qd x 3 d    Dispense:  18 tablet    Refill:  0    Order Specific Question:   Supervising Provider    Answer:   Mikey Kirschner [[id number]]  . betamethasone dipropionate (DIPROLENE) 0.05 % cream    Sig: Apply topically 2 (two) times daily.    Dispense:  30 g    Refill:  0    Order Specific Question:   Supervising Provider    Answer:   Mikey Kirschner [[id number]]   Claritin or Allegra in the morning with Benadryl at night. Restart Pepcid daily, patient has been off of this for weeks. Warning signs reviewed. Call back in 2 weeks if  symptoms persist, sooner if worse.

## 2015-12-29 ENCOUNTER — Encounter: Payer: Self-pay | Admitting: Nurse Practitioner

## 2015-12-29 DIAGNOSIS — E8809 Other disorders of plasma-protein metabolism, not elsewhere classified: Secondary | ICD-10-CM | POA: Insufficient documentation

## 2015-12-29 DIAGNOSIS — E756 Lipid storage disorder, unspecified: Secondary | ICD-10-CM | POA: Insufficient documentation

## 2015-12-29 LAB — ALPHA-GAL PANEL
Alpha Gal IgE*: 14.9 kU/L — ABNORMAL HIGH (ref <0.35)
BEEF (BOS SPP) IGE: 2.53 kU/L — AB (ref <0.35)
Class Interpretation: 2
Class Interpretation: 2
LAMB CLASS INTERPRETATION: 1
LAMB/MUTTON (OVIS SPP) IGE: 0.4 kU/L — AB (ref <0.35)
Pork (Sus spp) IgE: 1.18 kU/L — ABNORMAL HIGH (ref <0.35)

## 2016-01-07 ENCOUNTER — Other Ambulatory Visit: Payer: Self-pay | Admitting: Family Medicine

## 2016-02-18 ENCOUNTER — Other Ambulatory Visit: Payer: Self-pay | Admitting: Family Medicine

## 2016-03-28 ENCOUNTER — Encounter: Payer: Self-pay | Admitting: Family Medicine

## 2016-03-28 ENCOUNTER — Ambulatory Visit (INDEPENDENT_AMBULATORY_CARE_PROVIDER_SITE_OTHER): Payer: 59 | Admitting: Family Medicine

## 2016-03-28 VITALS — BP 136/84 | Temp 97.7°F | Ht 69.5 in | Wt 221.4 lb

## 2016-03-28 DIAGNOSIS — S39012A Strain of muscle, fascia and tendon of lower back, initial encounter: Secondary | ICD-10-CM

## 2016-03-28 MED ORDER — PREDNISONE 10 MG PO TABS: ORAL_TABLET | ORAL | 0 refills | Status: DC

## 2016-03-28 MED ORDER — CHLORZOXAZONE 500 MG PO TABS: 500.0000 mg | ORAL_TABLET | Freq: Three times a day (TID) | ORAL | 0 refills | Status: DC | PRN

## 2016-03-28 NOTE — Progress Notes (Signed)
   Subjective:    Patient ID: Michael [name], male    DOB: [birth year], 62 y.o.   MRN: [id number]  Back Pain  This is a new problem. The current episode started 1 to 4 weeks ago. The pain is present in the lumbar spine. He has tried nothing for the symptoms.   Pt has had occas sharp pain left post flank last few weeks  eworse with motion  Worse with discomfort  Recalls no overuse or injury Patient states no other concerns this visit.  Review of Systems  Musculoskeletal: Positive for back pain.   No urinary symptoms no GI symptoms    Objective:   Physical Exam Alert vitals stable, NAD. Blood pressure good on repeat. HEENT normal. Lungs clear. Heart regular rate and rhythm. Negative straight leg raise. Negative spinal tenderness negative CVA tenderness some pain to deep palpation right lumbar para region       Assessment & Plan:  Impression subacute lumbar lower thoracic strain discussed plan prednisone taper. Anti-spasm meds. Encouraged work on exercises. In fact exercise may have first triggered this discussed

## 2016-04-15 ENCOUNTER — Other Ambulatory Visit: Payer: Self-pay | Admitting: Cardiology

## 2016-04-15 DIAGNOSIS — I10 Essential (primary) hypertension: Secondary | ICD-10-CM

## 2016-04-15 DIAGNOSIS — E785 Hyperlipidemia, unspecified: Secondary | ICD-10-CM

## 2016-05-07 ENCOUNTER — Other Ambulatory Visit: Payer: Self-pay | Admitting: Cardiology

## 2016-05-07 DIAGNOSIS — E785 Hyperlipidemia, unspecified: Secondary | ICD-10-CM

## 2016-05-07 DIAGNOSIS — I10 Essential (primary) hypertension: Secondary | ICD-10-CM

## 2016-05-20 ENCOUNTER — Other Ambulatory Visit: Payer: Self-pay | Admitting: Cardiology

## 2016-05-20 DIAGNOSIS — E785 Hyperlipidemia, unspecified: Secondary | ICD-10-CM

## 2016-05-20 DIAGNOSIS — I10 Essential (primary) hypertension: Secondary | ICD-10-CM

## 2016-06-20 ENCOUNTER — Encounter (HOSPITAL_COMMUNITY): Payer: 59

## 2016-06-20 ENCOUNTER — Ambulatory Visit: Payer: 59 | Admitting: Family

## 2016-06-29 ENCOUNTER — Encounter: Payer: Self-pay | Admitting: Cardiology

## 2016-07-21 ENCOUNTER — Encounter: Payer: Self-pay | Admitting: Cardiology

## 2016-07-21 ENCOUNTER — Encounter: Payer: Self-pay | Admitting: Family

## 2016-07-21 ENCOUNTER — Ambulatory Visit (INDEPENDENT_AMBULATORY_CARE_PROVIDER_SITE_OTHER): Payer: 59 | Admitting: Cardiology

## 2016-07-21 VITALS — BP 130/76 | HR 70 | Ht 69.5 in | Wt 201.0 lb

## 2016-07-21 DIAGNOSIS — I10 Essential (primary) hypertension: Secondary | ICD-10-CM | POA: Diagnosis not present

## 2016-07-21 DIAGNOSIS — Z951 Presence of aortocoronary bypass graft: Secondary | ICD-10-CM | POA: Diagnosis not present

## 2016-07-21 DIAGNOSIS — E7849 Other hyperlipidemia: Secondary | ICD-10-CM

## 2016-07-21 DIAGNOSIS — E784 Other hyperlipidemia: Secondary | ICD-10-CM | POA: Diagnosis not present

## 2016-07-21 DIAGNOSIS — I2510 Atherosclerotic heart disease of native coronary artery without angina pectoris: Secondary | ICD-10-CM | POA: Diagnosis not present

## 2016-07-21 DIAGNOSIS — I2583 Coronary atherosclerosis due to lipid rich plaque: Secondary | ICD-10-CM | POA: Diagnosis not present

## 2016-07-21 LAB — CBC WITH DIFFERENTIAL/PLATELET
Basophils Absolute: 0.1 10*3/uL (ref 0.0–0.2)
Basos: 1 %
EOS (ABSOLUTE): 0.3 10*3/uL (ref 0.0–0.4)
Eos: 5 %
Hematocrit: 45 % (ref 37.5–51.0)
Hemoglobin: 15.5 g/dL (ref 13.0–17.7)
Immature Grans (Abs): 0 10*3/uL (ref 0.0–0.1)
Immature Granulocytes: 0 %
Lymphocytes Absolute: 1.5 10*3/uL (ref 0.7–3.1)
Lymphs: 27 %
MCH: 32.2 pg (ref 26.6–33.0)
MCHC: 34.4 g/dL (ref 31.5–35.7)
MCV: 93 fL (ref 79–97)
Monocytes Absolute: 0.5 10*3/uL (ref 0.1–0.9)
Monocytes: 9 %
Neutrophils Absolute: 3.2 10*3/uL (ref 1.4–7.0)
Neutrophils: 58 %
Platelets: 193 10*3/uL (ref 150–379)
RBC: 4.82 x10E6/uL (ref 4.14–5.80)
RDW: 14.1 % (ref 12.3–15.4)
WBC: 5.6 10*3/uL (ref 3.4–10.8)

## 2016-07-21 LAB — COMPREHENSIVE METABOLIC PANEL
ALT: 23 IU/L (ref 0–44)
AST: 24 IU/L (ref 0–40)
Albumin/Globulin Ratio: 1.8 (ref 1.2–2.2)
Albumin: 4.6 g/dL (ref 3.6–4.8)
Alkaline Phosphatase: 62 IU/L (ref 39–117)
BUN/Creatinine Ratio: 14 (ref 10–24)
BUN: 14 mg/dL (ref 8–27)
Bilirubin Total: 0.5 mg/dL (ref 0.0–1.2)
CO2: 27 mmol/L (ref 20–29)
Calcium: 10.1 mg/dL (ref 8.6–10.2)
Chloride: 98 mmol/L (ref 96–106)
Creatinine, Ser: 0.98 mg/dL (ref 0.76–1.27)
GFR calc Af Amer: 96 mL/min/{1.73_m2} (ref >59)
GFR calc non Af Amer: 83 mL/min/{1.73_m2} (ref >59)
Globulin, Total: 2.5 g/dL (ref 1.5–4.5)
Glucose: 103 mg/dL — ABNORMAL HIGH (ref 65–99)
Potassium: 4.5 mmol/L (ref 3.5–5.2)
Sodium: 143 mmol/L (ref 134–144)
Total Protein: 7.1 g/dL (ref 6.0–8.5)

## 2016-07-21 LAB — LIPID PANEL
Chol/HDL Ratio: 2.7 ratio (ref 0.0–5.0)
Cholesterol, Total: 158 mg/dL (ref 100–199)
HDL: 58 mg/dL (ref >39)
LDL Calculated: 84 mg/dL (ref 0–99)
Triglycerides: 81 mg/dL (ref 0–149)
VLDL Cholesterol Cal: 16 mg/dL (ref 5–40)

## 2016-07-21 LAB — TSH: TSH: 1.05 u[IU]/mL (ref 0.450–4.500)

## 2016-07-21 MED ORDER — ASPIRIN EC 81 MG PO TBEC: 81.0000 mg | DELAYED_RELEASE_TABLET | Freq: Every day | ORAL | 3 refills | Status: AC

## 2016-07-21 NOTE — Patient Instructions (Signed)
Medication Instructions:   STOP TAKING ASPIRIN 325 MG NOW  START TAKING ASPIRIN 81 MG ONCE DAILY     Labwork:  TODAY--CMET, CBC W DIFF, TSH, AND LIPIDS     Follow-Up:  Your physician wants you to follow-up in: [place] will receive a reminder letter in the mail two months in advance. If you don't receive a letter, please call our office to schedule the follow-up appointment.        If you need a refill on your cardiac medications before your next appointment, please call your pharmacy.

## 2016-07-21 NOTE — Progress Notes (Signed)
Patient ID: Michael [name], male   DOB: 07-10-1955, 61 y.o.   MRN: [id number]    Patient Name: Michael [name] Date of Encounter: 07/21/2016  Primary Care Provider:  Mikey Kirschner, MD Primary Cardiologist:  Ena Dawley  Problem List   Past Medical History:  Diagnosis Date  . Allergy   . Anginal pain ([place])    last time a yr ago  . Back pain   . Bruises easily   . CAD (coronary artery disease)   . Coronary atherosclerosis of artery bypass graft   . Essential hypertension, benign    Takes Amlodipine and Losartan-HCTZ daily;takes Coreg daily  . GERD (gastroesophageal reflux disease)    was on Prilosec;is not currently on any meds  . Gout    takes Allopurinol daily  . Malignant hyperthermia    Potential but not Confirmed  . Neck pain    yrs ago and saw a chiropractor occasionally  . Other and unspecified hyperlipidemia    takes Simvastatin daily  . Peripheral vascular disease ([place])   . Urinary urgency    Past Surgical History:  Procedure Laterality Date  . ANKLE FRACTURE SURGERY Left 1981  . CARDIAC CATHETERIZATION  2015  . CAROTID ENDARTERECTOMY Left 04-29-12   cea  . COLONOSCOPY    . COLONOSCOPY N/A 11/21/2013   Procedure: COLONOSCOPY;  Surgeon: Danie Binder, MD;  Location: [facility];  Service: Endoscopy;  Laterality: N/A;  1:15 PM  . CORONARY ARTERY BYPASS GRAFT  1999   x 4  . CYSTOSCOPY    . ENDARTERECTOMY Left 04/29/2012   Procedure: ENDARTERECTOMY CAROTID;  Surgeon: Rosetta Posner, MD;  Location: [place];  Service: Vascular;  Laterality: Left;  with primary closure of artery  . ESOPHAGOGASTRODUODENOSCOPY    . FRACTURE SURGERY  1980   screws placed and in 1981 screws removed(this is when was told about potential for [place]  . INGUINAL HERNIA REPAIR Bilateral Aug. 2005  . LEFT HEART CATHETERIZATION WITH CORONARY/GRAFT ANGIOGRAM N/A 06/10/2013   Procedure: LEFT HEART CATHETERIZATION WITH Beatrix Fetters;  Surgeon: Peter M Martinique, MD;  Location: [facility] CATH LAB;   Service: Cardiovascular;  Laterality: N/A;    Allergies  Allergies  Allergen Reactions  . Anesthetics, [place] Anaphylaxis  . Ramipril Cough  . Losartan     HPI  [name] is a former patient of Dr Verl Blalock. He has h/o CAD, CABG in 1999, PAD, s/p left carotid endarterectomy in April 2014, hypertension and hyperlipidemia. This is his 1 year follow up. He denies any chest pain and feels SOB on strenuous exercise. He underwent carotid endarterectomy for symptoms of dizziness and neck tingling that has resolved partially. He still feels dizzy sometimes. He denies palpitations, orthopnea, PND or syncope. He has gained weight, the last year he started to exercise daily, but with symptoms of dizziness and recent surgery he stopped. He is motivated to do so. He states that sometimes he develops feet edema.  The patient was scheduled for 1 year follow up but is coming after 6 months and is reporting dizziness especially after he stands up but can occur while walking for a while as well. No syncope. He is also experiencing retrosternal tightness that is not exertional, the last one a week ago that persisted for hours. No obvious exacerbating or alleviating factors. No palpitations, no LE edema, orthopnea or PND.   01/16/14 - the patient is coming after 6 months, he underwent left cardiac cath in June 2015 for abnormal  stress test. The cath showed severe native 3VD and all bypass vessels patent.   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 Since the last week he is he has bellow intermittent facial swelling and tongue swelling and underwent extensive workup, and seems to be responding to antihistamines. He has gained weight, and has impaired glucose tolerance now.  04/02/2015 - 2 months follow up, at the last visit we added Imdur 30 to improve BP control.  He also had new negative T waves in his ECG in the lateral leads V4-6. He experienced headaches from imdur, switched to hydralazine 25 mg po TID, he is busy and takes it BID, but his BP is still controlled. Denies chest pain, DOE, LE edema. No palpitations or syncope.  07/21/2016 - 1 year follow-up, the patient feels and looks great, he has started diet program and has lost over 30 pounds in the last year. He is also found on his allergic to beef and pork. This is associated with neck swelling and rash. He continues to work in [employer], has no chest pain shortness of breath no lower extremity edema palpitations dizziness or syncope. He has been compliant with his medication and has no side effects.  Home Medications  Current Outpatient Prescriptions:  .  allopurinol (ZYLOPRIM) 300 MG tablet, Take 1 tablet (300 mg total) by mouth daily., Disp: 90 tablet, Rfl: 0 .  amLODipine (NORVASC) 10 MG tablet, Take 1 tablet (10 mg total) by mouth daily., Disp: 90 tablet, Rfl: 0 .  aspirin 325 MG tablet, Take 325 mg by mouth daily., Disp: , Rfl:  .  betamethasone dipropionate (DIPROLENE) 0.05 % cream, Apply topically 2 (two) times daily., Disp: 30 g, Rfl: 0 .  budesonide (RHINOCORT AQUA) 32 MCG/ACT nasal spray, Place 1 spray into both nostrils daily as needed. , Disp: , Rfl:  .  carvedilol (COREG) 25 MG tablet, Take 1 tablet (25 mg total) by mouth 2 (two) times daily with a meal., Disp: 180 tablet, Rfl: 0 .  cetirizine (ZYRTEC) 10 MG tablet, Take 10 mg by mouth daily., Disp: , Rfl:  .  chlorzoxazone (PARAFON) 500 MG tablet, Take 1 tablet (500 mg total) by mouth 3 (three) times daily as needed for muscle spasms., Disp: 30 tablet, Rfl: 0 .  EPINEPHrine 0.3 mg/0.3 mL IJ SOAJ injection, Use as directed for life-threatening allergic reaction., Disp: 2 Device, Rfl: 3 .  EPIPEN 2-PAK 0.3 MG/0.3ML SOAJ injection, Inject 0.3 mg as directed as needed (allergic reaction). , Disp: , Rfl:  .  famotidine (PEPCID) 10 MG  tablet, Take 10 mg by mouth 2 (two) times daily., Disp: , Rfl:  .  hydrALAZINE (APRESOLINE) 25 MG tablet, Take 1 tablet (25 mg total) by mouth 2 (two) times daily., Disp: 180 tablet, Rfl: 6 .  hydrochlorothiazide (HYDRODIURIL) 25 MG tablet, TAKE 1 TABLET DAILY, Disp: 90 tablet, Rfl: 0 .  Multiple Vitamin (MULTI-VITAMIN DAILY PO), Take 1 tablet by mouth daily. , Disp: , Rfl:  .  SALINE NASAL MIST NA, Place 1 spray into both nostrils daily as needed (allergies)., Disp: , Rfl:  .  simvastatin (ZOCOR) 20 MG tablet, Take 1 tablet (20 mg total) by mouth at bedtime., Disp: 90 tablet, Rfl: 6 .  tamsulosin (FLOMAX) 0.4 MG CAPS capsule, TAKE 1 CAPSULE DAILY AT BEDTIME (NEED OFFICE VISIT), Disp: 15  capsule, Rfl: 0 .  predniSONE (DELTASONE) 10 MG tablet, Take 4 tablets by mouth for 4 days, then take 3 tablets by mouth for 4 days. (Patient not taking: Reported on 07/21/2016), Disp: 28 tablet, Rfl: 0 .  predniSONE (DELTASONE) 20 MG tablet, 3 po qd x 3 d then 2 po qd x 3 d then 1 po qd x 3 d (Patient not taking: Reported on 07/21/2016), Disp: 18 tablet, Rfl: 0  Family History  Family History  Problem Relation Age of Onset  . Kidney failure Mother   . Ovarian cancer Mother   . Arthritis Mother   . Kidney disease Mother   . Hypertension Mother   . Breast cancer Mother   . Hypertension Father   . CVA Brother 3  . Hypertension Brother     Social History  Social History   Social History  . Marital status: Divorced    Spouse name: N/A  . Number of children: N/A  . Years of education: N/A   Occupational History  . 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 Smoking status: Former Smoker    Packs/day: 1.00    Years: 20.00    Types: Cigarettes    Quit date: 05/14/1996  . Smokeless tobacco: Never Used     Comment: quit 1995  . Alcohol use 3.6 oz/week    6 Cans of beer per week  . Drug use: No  . Sexual activity: No   Other Topics Concern  . Not  on file   Social History Narrative  . No narrative on file    Review of Systems, as per HPI, otherwise negative General:  No chills, fever, night sweats or weight changes.  Cardiovascular:  No chest pain, dyspnea on exertion, edema, orthopnea, palpitations, paroxysmal nocturnal dyspnea. Dermatological: No rash, lesions/masses Respiratory: No cough, dyspnea Urologic: No hematuria, dysuria Abdominal:   No nausea, vomiting, diarrhea, bright red blood per rectum, melena, or hematemesis Neurologic:  No visual changes, wkns, changes in mental status. All other systems reviewed and are otherwise negative except as noted above.  Physical Exam  Blood pressure 130/76, pulse 70, height 5' 9.5" (1.765 m), weight 220 lb (99.8 kg).  General: Pleasant, NAD Psych: Normal affect. Neuro: Alert and oriented X 3. Moves all extremities spontaneously. HEENT: Normal  Neck: Supple without bruits or JVD. Lungs:  Resp regular and unlabored, CTA. Heart: RRR no s3, s4, or murmurs. Abdomen: Soft, non-tender, non-distended, BS + x 4.  Extremities: No clubbing, cyanosis or edema. DP/PT/Radials 2+ and equal bilaterally. There is a mild erythematosus flat rash on his posterior left calf.  Labs:  No results for input(s): CKTOTAL, CKMB, TROPONINI in the last 72 hours. Lab Results  Component Value Date   WBC 7.6 05/22/2014   HGB 14.7 05/22/2014   HCT 43.1 05/22/2014   MCV 91.9 05/22/2014   PLT 204 05/22/2014   No results for input(s): NA, K, CL, CO2, BUN, CREATININE, CALCIUM, PROT, BILITOT, ALKPHOS, ALT, AST, GLUCOSE in the last 168 hours.  Invalid input(s): LABALBU Lab Results  Component Value Date   CHOL 163 01/05/2015   HDL 41 01/05/2015   LDLCALC 94 01/05/2015   TRIG 140 01/05/2015   No results found for: DDIMER Invalid input(s): POCBNP  Accessory Clinical Findings  Echocardiogram 01/04/2012 Study Conclusions  - Left ventricle: The cavity size was normal. Wall thickness was increased in a  pattern of mild LVH. Systolic function was normal.  The estimated ejection fraction was in the range of 60% to 65%. Wall motion was normal; there were no regional wall motion abnormalities. Left ventricular diastolic function parameters were normal.  ECG - normal sinus rhythm, 83 beats per minute, nonspecific T wave abnormality.  Exercise nuclear stress test 12/12/2013 Exercise Capacity: Good exercise capacity. BP Response: Baseline HTN with Hypertensive blood pressure  response. DBP increased from 85 - 117 mmHg; SBP 157 - 193 mmHg Clinical Symptoms: The exercise was limited by dyspnea &  fatigue. No anginal pain.. ECG Impression: Significant ST abnormalities consistent with  ischemia. Comparison with Prior Nuclear Study: No images to compare LV Wall Motion: NL LV Function; NL Wall Motion  Overall Impression: Low to Intermediate risk stress nuclear  study Abnormal ECG portion of the Exercise Myoview with notable  ST depression concerning for ischemia along with PVCs in singlets & couplet that were more prominent . There is no Scintigraphic  finding to suggest ischemia, but the presence of significant  splanchnic attenuation makes the study less interpretable.  Cardiac cath 06/10/2013 Left ventriculography: Left ventricular systolic function is normal, LVEF is estimated at 55-65%, there is no significant mitral regurgitation   Final Conclusions:  1. Severe 3 vessel obstructive CAD 2. All grafts are patent including SVG to diagonal 2, SVG to OM 2, RIMA to RCA, and LIMA to LAD.  3. Normal LV function.   Recommendations: Recommend continued medical therapy. His grafts are functioning very well. The PDA is occluded but fills by collaterals. The LCx in the AV groove has severe disease but does not supply much myocardium.   Peter M Martinique, [place]  EKG performed today 07/21/2016 was personally reviewed and it shows normal sinus rhythm with nonspecific T-wave abnormalities unchanged  from prior.    Assessment & Plan  1. CAD, s/p CABG in 1999, - the patient is presented with exertional dizziness and atypical nonexertional chest pain in 2015 and underwent a left cardiac cath in 06/2013 that showed all grafts are patent including SVG to diagonal 2, SVG to OM 2, RIMA to RCA, and LIMA to LAD.  He is completely asymptomatic on great diet and exercise regimen and weight loss, will continue the same management no ischemic workup needed right now.  2. Hypertension - well controlled, avoid ACEI or ARBs. With weight loss he would like to come off of some of the medication currently his blood pressure 130/76 in the future we might consider eliminating hydralazine.  3. PAD, s/p carotid endarterectomy, stable on duplex carotids on 06/2014.  4. Hyperlipidemia - at goal, we will recheck today, a he's cholesterol will good with weight loss we will decrease simvastatin to 10 mg daily.  Follow up in 1 year.  Ena Dawley, MD, [facility] 07/21/2016, 8:25 AM

## 2016-07-28 ENCOUNTER — Ambulatory Visit (HOSPITAL_COMMUNITY)
Admission: RE | Admit: 2016-07-28 | Discharge: 2016-07-28 | Disposition: A | Discharge status: Home / Self Care | Payer: 59 | Source: Ambulatory Visit | Attending: Vascular Surgery | Admitting: Vascular Surgery

## 2016-07-28 ENCOUNTER — Ambulatory Visit (INDEPENDENT_AMBULATORY_CARE_PROVIDER_SITE_OTHER): Payer: 59 | Admitting: Family

## 2016-07-28 ENCOUNTER — Encounter: Payer: Self-pay | Admitting: Family

## 2016-07-28 VITALS — BP 143/89 | HR 66 | Temp 97.9°F | Resp 18 | Ht 70.0 in | Wt 200.0 lb

## 2016-07-28 DIAGNOSIS — Z87891 Personal history of nicotine dependence: Secondary | ICD-10-CM | POA: Diagnosis not present

## 2016-07-28 DIAGNOSIS — Z9889 Other specified postprocedural states: Secondary | ICD-10-CM

## 2016-07-28 DIAGNOSIS — I6522 Occlusion and stenosis of left carotid artery: Secondary | ICD-10-CM

## 2016-07-28 DIAGNOSIS — Z48812 Encounter for surgical aftercare following surgery on the circulatory system: Secondary | ICD-10-CM | POA: Diagnosis not present

## 2016-07-28 LAB — VAS US CAROTID
LCCADSYS: 79 cm/s
LCCAPDIAS: 24 cm/s
LCCAPSYS: 86 cm/s
LEFT ECA DIAS: -16 cm/s
LICADDIAS: -29 cm/s
LICADSYS: -66 cm/s
LICAPSYS: -78 cm/s
Left CCA dist dias: 27 cm/s
Left ICA prox dias: -32 cm/s
RCCAPSYS: 60 cm/s
RIGHT CCA MID DIAS: 26 cm/s
RIGHT ECA DIAS: -17 cm/s
Right CCA prox dias: 15 cm/s
Right cca dist sys: -75 cm/s

## 2016-07-28 NOTE — Patient Instructions (Signed)
Stroke Prevention Some medical conditions and behaviors are associated with an increased chance of having a stroke. You may prevent a stroke by making healthy choices and managing medical conditions. How can I reduce my risk of having a stroke?  Stay physically active. Get at least 30 minutes of activity on most or all days.  Do not smoke. It may also be helpful to avoid exposure to secondhand smoke.  Limit alcohol use. Moderate alcohol use is considered to be:  No more than 2 drinks per day for men.  No more than 1 drink per day for nonpregnant women.  Eat healthy foods. This involves:  Eating 5 or more servings of fruits and vegetables a day.  Making dietary changes that address high blood pressure (hypertension), high cholesterol, diabetes, or obesity.  Manage your cholesterol levels.  Making food choices that are high in fiber and low in saturated fat, trans fat, and cholesterol may control cholesterol levels.  Take any prescribed medicines to control cholesterol as directed by your health care provider.  Manage your diabetes.  Controlling your carbohydrate and sugar intake is recommended to manage diabetes.  Take any prescribed medicines to control diabetes as directed by your health care provider.  Control your hypertension.  Making food choices that are low in salt (sodium), saturated fat, trans fat, and cholesterol is recommended to manage hypertension.  Ask your health care provider if you need treatment to lower your blood pressure. Take any prescribed medicines to control hypertension as directed by your health care provider.  If you are 18-39 years of age, have your blood pressure checked every 3-5 years. If you are 40 years of age or older, have your blood pressure checked every year.  Maintain a healthy weight.  Reducing calorie intake and making food choices that are low in sodium, saturated fat, trans fat, and cholesterol are recommended to manage  weight.  Stop drug abuse.  Avoid taking birth control pills.  Talk to your health care provider about the risks of taking birth control pills if you are over 35 years old, smoke, get migraines, or have ever had a blood clot.  Get evaluated for sleep disorders (sleep apnea).  Talk to your health care provider about getting a sleep evaluation if you snore a lot or have excessive sleepiness.  Take medicines only as directed by your health care provider.  For some people, aspirin or blood thinners (anticoagulants) are helpful in reducing the risk of forming abnormal blood clots that can lead to stroke. If you have the irregular heart rhythm of atrial fibrillation, you should be on a blood thinner unless there is a good reason you cannot take them.  Understand all your medicine instructions.  Make sure that other conditions (such as anemia or atherosclerosis) are addressed. Get help right away if:  You have sudden weakness or numbness of the face, arm, or leg, especially on one side of the body.  Your face or eyelid droops to one side.  You have sudden confusion.  You have trouble speaking (aphasia) or understanding.  You have sudden trouble seeing in one or both eyes.  You have sudden trouble walking.  You have dizziness.  You have a loss of balance or coordination.  You have a sudden, severe headache with no known cause.  You have new chest pain or an irregular heartbeat. Any of these symptoms may represent a serious problem that is an emergency. Do not wait to see if the symptoms will go away.   Get medical help at once. Call your local emergency services (911 in [place] Do not drive yourself to the hospital. This information is not intended to replace advice given to you by your health care provider. Make sure you discuss any questions you have with your health care provider. Document Released: 01/27/2004 Document Revised: 05/27/2015 Document Reviewed: 06/21/2012 Elsevier  Interactive Patient Education  2017 [employer]  

## 2016-07-28 NOTE — Progress Notes (Signed)
Chief Complaint: Follow up Extracranial Carotid Artery Stenosis   History of Present Illness  Michael [name] is a 61 y.o. male who is s/p left CEA on 04/29/12 by Dr. Donnetta Hutching. He returns today for routine follow up. He has no history of stroke or TIA.  Pt denies claudication symptoms with walking, denies tingling, numbness, or pain in either arm. He had a CABG in 1999, denies any hx of MI, sees Dr. Meda Coffee.   Pt Diabetic: No Pt smoker: former smoker, quit in 1995  Pt meds include: Statin : Yes ASA: Yes Other anticoagulants/antiplatelets: no    Past Medical History:  Diagnosis Date  . Allergy   . Anginal pain ([place])    last time a yr ago  . Back pain   . Bruises easily   . CAD (coronary artery disease)   . Coronary atherosclerosis of artery bypass graft   . Essential hypertension, benign    Takes Amlodipine and Losartan-HCTZ daily;takes Coreg daily  . GERD (gastroesophageal reflux disease)    was on Prilosec;is not currently on any meds  . Gout    takes Allopurinol daily  . Malignant hyperthermia    Potential but not Confirmed  . Neck pain    yrs ago and saw a chiropractor occasionally  . Other and unspecified hyperlipidemia    takes Simvastatin daily  . Peripheral vascular disease ([place])   . Urinary urgency     Social History Social History  Substance Use Topics  . Smoking status: Former Smoker    Packs/day: 1.00    Years: 20.00    Types: Cigarettes    Quit date: 05/14/1996  . Smokeless tobacco: Never Used     Comment: quit 1995  . Alcohol use 3.6 oz/week    6 Cans of beer per week    Family History Family History  Problem Relation Age of Onset  . Kidney failure Mother   . Ovarian cancer Mother   . Arthritis Mother   . Kidney disease Mother   . Hypertension Mother   . Breast cancer Mother   . Hypertension Father   . CVA Brother 41  . Hypertension Brother     Surgical History Past Surgical History:  Procedure Laterality Date  . ANKLE FRACTURE  SURGERY Left 1981  . CARDIAC CATHETERIZATION  2015  . CAROTID ENDARTERECTOMY Left 04-29-12   cea  . COLONOSCOPY    . COLONOSCOPY N/A 11/21/2013   Procedure: COLONOSCOPY;  Surgeon: Danie Binder, MD;  Location: [facility];  Service: Endoscopy;  Laterality: N/A;  1:15 PM  . CORONARY ARTERY BYPASS GRAFT  1999   x 4  . CYSTOSCOPY    . ENDARTERECTOMY Left 04/29/2012   Procedure: ENDARTERECTOMY CAROTID;  Surgeon: Rosetta Posner, MD;  Location: [place];  Service: Vascular;  Laterality: Left;  with primary closure of artery  . ESOPHAGOGASTRODUODENOSCOPY    . FRACTURE SURGERY  1980   screws placed and in 1981 screws removed(this is when was told about potential for [place]  . INGUINAL HERNIA REPAIR Bilateral Aug. 2005  . LEFT HEART CATHETERIZATION WITH CORONARY/GRAFT ANGIOGRAM N/A 06/10/2013   Procedure: LEFT HEART CATHETERIZATION WITH Beatrix Fetters;  Surgeon: Peter M Martinique, MD;  Location: [facility] CATH LAB;  Service: Cardiovascular;  Laterality: N/A;    Allergies  Allergen Reactions  . Anesthetics, [place] Anaphylaxis  . Ramipril Cough  . Losartan     Current Outpatient Prescriptions  Medication Sig Dispense Refill  . allopurinol (ZYLOPRIM) 300 MG tablet  Take 1 tablet (300 mg total) by mouth daily. 90 tablet 0  . amLODipine (NORVASC) 10 MG tablet Take 1 tablet (10 mg total) by mouth daily. 90 tablet 0  . aspirin EC 81 MG tablet Take 1 tablet (81 mg total) by mouth daily. 90 tablet 3  . betamethasone dipropionate (DIPROLENE) 0.05 % cream Apply topically 2 (two) times daily. 30 g 0  . budesonide (RHINOCORT AQUA) 32 MCG/ACT nasal spray Place 1 spray into both nostrils daily as needed.     . carvedilol (COREG) 25 MG tablet Take 1 tablet (25 mg total) by mouth 2 (two) times daily with a meal. 180 tablet 0  . cetirizine (ZYRTEC) 10 MG tablet Take 10 mg by mouth daily.    . chlorzoxazone (PARAFON) 500 MG tablet Take 1 tablet (500 mg total) by mouth 3 (three) times daily as needed for muscle spasms.  30 tablet 0  . EPINEPHrine 0.3 mg/0.3 mL IJ SOAJ injection Use as directed for life-threatening allergic reaction. 2 Device 3  . EPIPEN 2-PAK 0.3 MG/0.3ML SOAJ injection Inject 0.3 mg as directed as needed (allergic reaction).     . famotidine (PEPCID) 10 MG tablet Take 10 mg by mouth 2 (two) times daily.    . hydrALAZINE (APRESOLINE) 25 MG tablet Take 1 tablet (25 mg total) by mouth 2 (two) times daily. 180 tablet 6  . hydrochlorothiazide (HYDRODIURIL) 25 MG tablet TAKE 1 TABLET DAILY 90 tablet 0  . Multiple Vitamin (MULTI-VITAMIN DAILY PO) Take 1 tablet by mouth daily.     . predniSONE (DELTASONE) 10 MG tablet Take 4 tablets by mouth for 4 days, then take 3 tablets by mouth for 4 days. 28 tablet 0  . predniSONE (DELTASONE) 20 MG tablet 3 po qd x 3 d then 2 po qd x 3 d then 1 po qd x 3 d 18 tablet 0  . SALINE NASAL MIST NA Place 1 spray into both nostrils daily as needed (allergies).    . simvastatin (ZOCOR) 20 MG tablet Take 1 tablet (20 mg total) by mouth at bedtime. 90 tablet 6  . tamsulosin (FLOMAX) 0.4 MG CAPS capsule TAKE 1 CAPSULE DAILY AT BEDTIME (NEED OFFICE VISIT) 15 capsule 0   No current facility-administered medications for this visit.     Review of Systems : See HPI for pertinent positives and negatives.  Physical Examination  Vitals:   07/28/16 0841 07/28/16 0843 07/28/16 0845  BP: (!) 147/88 (!) 147/92 (!) 143/89  Pulse: 66 66 66  Resp: 18    Temp: 97.9 F (36.6 C)    SpO2: 98%    Weight: 200 lb (90.7 kg)    Height: 5\' 10"  (1.778 m)     Body mass index is 28.7 kg/m.  General: WDWN male in NAD GAIT: normal Eyes: PERRLA Pulmonary:  Respirations are non-labored, good air movement, CTAB, no rales, rhonchi, or wheezing.  Cardiac: regular rhythm and rate, no detected murmur.  VASCULAR EXAM Carotid Bruits Right Left   Negative Negative     Abdominal aortic pulse is not palpable. Radial pulses are 2+ palpable and equal.  LE Pulses Right Left       POPLITEAL  not palpable   not palpable       POSTERIOR TIBIAL   palpable    palpable        DORSALIS PEDIS      ANTERIOR TIBIAL  palpable   palpable     Gastrointestinal: soft, nontender, BS WNL, no r/g, no palpable masses.  Musculoskeletal: No muscle atrophy/wasting. M/S 5/5 throughout, extremities without ischemic changes. No peripheral edema.  Neurologic:  A&O X 3; appropriate affect, sensation is normal; speech is normal, CN 2-12 intact, pain and light touch intact in extremities, motor exam as listed above.    Assessment: Michael [name] is a 61 y.o. male whowho is s/p left CEA on 04/29/12. He has no history of stroke or TIA.  Fortunately he does not have DM and quit tobacco use in 1995. He has intentionally lost weight through a program sponsored by his workplace, his BMI is no longer in the obese range, now in the overweight range.  He does have a hx of CAD, followed by Dr. Meda Coffee.     DATA Carotid Duplex (07/28/16): Less than 40% right internal carotid artery stenosis. Patent left carotid endarterectomy site with no evidence for restenosis. Bilateral vertebral artery flow is antegrade.  Bilateral subclavian artery waveforms are normal.  No change since exams of 06/10/2014 and 06/15/15.   Plan: Follow-up in 18 months with Carotid Duplex scan.    I discussed in depth with the patient the nature of atherosclerosis, and emphasized the importance of maximal medical management including strict control of blood pressure, blood glucose, and lipid levels, obtaining regular exercise, and continued cessation of smoking.  The patient is aware that without maximal medical management the underlying atherosclerotic disease process will progress, limiting the benefit of any interventions. The patient was given information about stroke prevention and what symptoms should prompt  the patient to seek immediate medical care. Thank you for allowing [place] to participate in this patient's care.  Clemon Chambers, RN, MSN, FNP-C [facility] Office: [phone]  Clinic Physician: Donzetta Matters  07/28/16 8:50 AM

## 2016-07-31 ENCOUNTER — Telehealth: Payer: Self-pay | Admitting: *Deleted

## 2016-07-31 MED ORDER — SIMVASTATIN 10 MG PO TABS: 10.0000 mg | ORAL_TABLET | Freq: Every day | ORAL | 3 refills | Status: DC

## 2016-07-31 NOTE — Telephone Encounter (Signed)
Notified the pt that per Dr Meda Coffee, all his labs were normal, including his lipids.  Informed the pt that per Dr Meda Coffee, we can decrease his simvastatin to 10 mg po daily.  Confirmed the pharmacy of choice with the pt. Pt verbalized understanding and agrees with this plan.

## 2016-07-31 NOTE — Telephone Encounter (Signed)
-----   Message from Dorothy Spark, MD sent at 07/31/2016  9:21 AM EDT ----- All labs normal including lipids, we can decrease simvastatin to 10 mg po daily

## 2016-08-08 NOTE — Addendum Note (Signed)
Addended by: Lianne Cure A on: 08/08/2016 03:43 PM   Modules accepted: Orders

## 2016-09-05 ENCOUNTER — Ambulatory Visit (INDEPENDENT_AMBULATORY_CARE_PROVIDER_SITE_OTHER): Payer: 59 | Admitting: Family Medicine

## 2016-09-05 ENCOUNTER — Encounter: Payer: Self-pay | Admitting: Family Medicine

## 2016-09-05 VITALS — BP 134/78 | Temp 98.6°F | Ht 69.5 in | Wt 203.0 lb

## 2016-09-05 DIAGNOSIS — R1031 Right lower quadrant pain: Secondary | ICD-10-CM

## 2016-09-05 DIAGNOSIS — S161XXA Strain of muscle, fascia and tendon at neck level, initial encounter: Secondary | ICD-10-CM | POA: Diagnosis not present

## 2016-09-05 MED ORDER — PREDNISONE 10 MG PO TABS: ORAL_TABLET | ORAL | 0 refills | Status: DC

## 2016-09-05 NOTE — Progress Notes (Signed)
   Subjective:    Patient ID: Michael [name], male    DOB: Mar 26, 1955, 61 y.o.   MRN: [id number]  Neck Pain   This is a new problem. The current episode started 1 to 4 weeks ago. Stiffness is present at night and in the morning. He has tried nothing for the symptoms.  Abdominal Pain  This is a new problem. The current episode started 1 to 4 weeks ago. The pain is located in the RUQ and RLQ. He has tried nothing for the symptoms.   Neck pain wors and worse last few wks, almost an achey feeling, worse at night, pains someone at first in the morn but clears up during the night  Pos pain definitely with nck motion  Tried no real meds otc  Stomach acting up for a month, some discomfort rlq, llllllllllpainful at times, worse with movement and motion, has been doing some physical labor. Notes the pain acts up when he does physical activities  No sig change of bowels , for awhile has dealt with more frequent stooling  Last colonoscoy2015   Patient states no other concerns this visit.   Review of Systems  Gastrointestinal: Positive for abdominal pain.  Musculoskeletal: Positive for neck pain.       Objective:   Physical Exam Alert active good hydration HEENT no abnormality positive paracervical posterior tenderness bilateral positive pain with rotation. Lungs clear heart rare rhythm abdomen good bowel sounds no discrete tenderness mild discomfort to deep palpation right lower quadrant. No hernia palpated.       Assessment & Plan:  Impression probable neck strain #2 probable abdominal strain. Subacute. Plan trial of prednisone. Symptom care discussed. Warning signs discussed carefully if either neck pain or abdominal pain persists return for further evaluation and potential test. Of note patient had multiple questions and all answered. Not showing up signs and symptoms this point to warrant mega-workup of either neck or abdomen. Up-to-date on colonoscopy. If persists after trial of  anti-inflammatory return for further workup  Greater than 50% of this 25 minute face to face visit was spent in counseling and discussion and coordination of care regarding the above diagnosis/diagnosies

## 2016-10-08 ENCOUNTER — Other Ambulatory Visit: Payer: Self-pay | Admitting: Cardiology

## 2016-10-08 DIAGNOSIS — I10 Essential (primary) hypertension: Secondary | ICD-10-CM

## 2016-10-08 DIAGNOSIS — E785 Hyperlipidemia, unspecified: Secondary | ICD-10-CM

## 2016-12-21 ENCOUNTER — Ambulatory Visit (HOSPITAL_COMMUNITY)
Admission: RE | Admit: 2016-12-21 | Discharge: 2016-12-21 | Disposition: A | Discharge status: Home / Self Care | Payer: 59 | Source: Ambulatory Visit | Attending: Family Medicine | Admitting: Family Medicine

## 2016-12-21 ENCOUNTER — Ambulatory Visit (INDEPENDENT_AMBULATORY_CARE_PROVIDER_SITE_OTHER): Payer: 59 | Admitting: Family Medicine

## 2016-12-21 ENCOUNTER — Encounter: Payer: Self-pay | Admitting: Family Medicine

## 2016-12-21 VITALS — BP 150/86 | Temp 98.0°F | Ht 69.5 in | Wt 207.0 lb

## 2016-12-21 DIAGNOSIS — Z79899 Other long term (current) drug therapy: Secondary | ICD-10-CM

## 2016-12-21 DIAGNOSIS — N400 Enlarged prostate without lower urinary tract symptoms: Secondary | ICD-10-CM

## 2016-12-21 DIAGNOSIS — M5030 Other cervical disc degeneration, unspecified cervical region: Secondary | ICD-10-CM | POA: Diagnosis not present

## 2016-12-21 DIAGNOSIS — Z13220 Encounter for screening for lipoid disorders: Secondary | ICD-10-CM | POA: Diagnosis not present

## 2016-12-21 DIAGNOSIS — M542 Cervicalgia: Secondary | ICD-10-CM

## 2016-12-21 DIAGNOSIS — Z125 Encounter for screening for malignant neoplasm of prostate: Secondary | ICD-10-CM

## 2016-12-21 MED ORDER — TAMSULOSIN HCL 0.4 MG PO CAPS: ORAL_CAPSULE | ORAL | 5 refills | Status: DC

## 2016-12-21 NOTE — Progress Notes (Signed)
   Subjective:    Patient ID: Michael [name], male    DOB: 09/17/1955, 61 y.o.   MRN: [id number] Patient arrives with multiple concerns HPI  Patient is here today with complaints of neck pain off and on for a couple of months now. Was given prednisone and it helped,but it is now back since off of the prednisone.  Stiff and painfu when not moving  Worse when waking up in the morn   Minimal radiation into the arms most pain located right about the neck Pt concerned about possible other causs iof pain   Pt has had pin off and on with neck in past, saw neurosurgeon, while bk , told a disc was possibly in need of surg, instead saw a chiro and it got beter for while    Patient also concerned about his urinating.  Up multiple times per night.  Took Flomax 1 nightly 8 months ago with no improvement.  Sometimes feels slight dysuria when urinating. Had irritation and temporary swelling at end of the urethra whih affected the stream Now resolved    Review of Systems No headache, no major weight loss or weight gain, no chest pain no back pain abdominal pain no change in bowel habits complete ROS otherwise negative     Objective:   Physical Exam  Alert and oriented, vitals reviewed and stable, NAD ENT-TM's and ext canals WNL bilat via otoscopic exam Soft palate, tonsils and post pharynx WNL via oropharyngeal exam Neck-symmetric, no masses; thyroid nonpalpable and nontender Pulmonary-no tachypnea or accessory muscle use; Clear without wheezes via auscultation Card--no abnrml murmurs, rhythm reg and rate WNL Carotid pulses symmetric, without bruits Positive paracervical tenderness and pain bilateral and symmetric positive pain with rotation no extension of pain into arms with any maneuver      Assessment & Plan:  Impression probable cervical arthritis and/or ligament/fascial pain paracervically speaking.  Doubt neuropathic process.  Discussed at length.  Hold off on MRI.  Will do x-ray.   May use Aleve sparingly during flares  2.  Increased urinary frequency.  Likely prostate hypertrophy as the main etiology.  Will bring back for physical.  In the meantime increase Flomax to 2 nightly.  Also will check PSA.  Further recommendations after these results plus physical exam  Greater than 50% of this 25 minute face to face visit was spent in counseling and discussion and coordination of care regarding the above diagnosis/diagnosies

## 2016-12-21 NOTE — Patient Instructions (Signed)
aleave take two tabs twice per day with food as needed for neck pain , use sparingly

## 2016-12-22 ENCOUNTER — Other Ambulatory Visit: Payer: Self-pay | Admitting: *Deleted

## 2016-12-22 LAB — HEPATIC FUNCTION PANEL
ALBUMIN: 4.9 g/dL — AB (ref 3.6–4.8)
ALK PHOS: 64 IU/L (ref 39–117)
ALT: 24 IU/L (ref 0–44)
AST: 27 IU/L (ref 0–40)
BILIRUBIN TOTAL: 0.5 mg/dL (ref 0.0–1.2)
Bilirubin, Direct: 0.16 mg/dL (ref 0.00–0.40)
Total Protein: 7.6 g/dL (ref 6.0–8.5)

## 2016-12-22 LAB — LIPID PANEL
CHOL/HDL RATIO: 3 ratio (ref 0.0–5.0)
Cholesterol, Total: 176 mg/dL (ref 100–199)
HDL: 58 mg/dL (ref >39)
LDL Calculated: 91 mg/dL (ref 0–99)
Triglycerides: 133 mg/dL (ref 0–149)
VLDL Cholesterol Cal: 27 mg/dL (ref 5–40)

## 2016-12-22 LAB — BASIC METABOLIC PANEL
BUN/Creatinine Ratio: 19 (ref 10–24)
BUN: 14 mg/dL (ref 8–27)
CALCIUM: 9.8 mg/dL (ref 8.6–10.2)
CO2: 27 mmol/L (ref 20–29)
CREATININE: 0.73 mg/dL — AB (ref 0.76–1.27)
Chloride: 99 mmol/L (ref 96–106)
GFR calc non Af Amer: 99 mL/min/{1.73_m2} (ref >59)
GFR, EST AFRICAN AMERICAN: 115 mL/min/{1.73_m2} (ref >59)
Glucose: 93 mg/dL (ref 65–99)
POTASSIUM: 4.1 mmol/L (ref 3.5–5.2)
SODIUM: 143 mmol/L (ref 134–144)

## 2016-12-22 LAB — PSA: Prostate Specific Ag, Serum: 1.1 ng/mL (ref 0.0–4.0)

## 2016-12-22 MED ORDER — TIZANIDINE HCL 4 MG PO CAPS: 4.0000 mg | ORAL_CAPSULE | Freq: Three times a day (TID) | ORAL | 2 refills | Status: DC | PRN

## 2016-12-25 ENCOUNTER — Telehealth: Payer: Self-pay | Admitting: Family Medicine

## 2016-12-25 MED ORDER — PREDNISONE 10 MG PO TABS: ORAL_TABLET | ORAL | 0 refills | Status: DC

## 2016-12-25 NOTE — Telephone Encounter (Signed)
He may have a refill of the prednisone taper that was prescribed as the 10 mg but I recommend that the patient does a follow-up office visit if ongoing troubles

## 2016-12-25 NOTE — Telephone Encounter (Signed)
Patient called wanting a prescription for prednisone 10 mg for flare-up of lower back pain. [place]

## 2016-12-25 NOTE — Telephone Encounter (Signed)
Prescription sent electronically to pharmacy. Patient notified. 

## 2016-12-25 NOTE — Telephone Encounter (Signed)
Patient seen this year for flare of back pain and given a prednisone 10 mg taper 03/2016

## 2017-01-01 ENCOUNTER — Telehealth: Payer: Self-pay | Admitting: Family Medicine

## 2017-01-01 NOTE — Telephone Encounter (Signed)
Patient had Rx for Tamsulosin called in.  He said that [facility] is giving him a hard time filling this because it is not 90 days.  Patient wants to know if we can call [facility] and have them go ahead and send out the shipment as prescribed

## 2017-01-01 NOTE — Telephone Encounter (Signed)
I called and left a message to r/c,to see if he wants a 90 day fill.

## 2017-01-01 NOTE — Telephone Encounter (Signed)
I called and spoke to [facility] and held for 33 minutes> They will mail the pt meds out as prescribed.I left a message for the pt to return call.

## 2017-01-04 NOTE — Telephone Encounter (Signed)
Patient is aware 

## 2017-01-18 ENCOUNTER — Encounter: Payer: Self-pay | Admitting: Family Medicine

## 2017-01-18 ENCOUNTER — Ambulatory Visit (INDEPENDENT_AMBULATORY_CARE_PROVIDER_SITE_OTHER): Payer: 59 | Admitting: Family Medicine

## 2017-01-18 VITALS — BP 126/72 | Ht 69.5 in | Wt 204.1 lb

## 2017-01-18 DIAGNOSIS — Z0000 Encounter for general adult medical examination without abnormal findings: Secondary | ICD-10-CM | POA: Diagnosis not present

## 2017-01-18 MED ORDER — TAMSULOSIN HCL 0.4 MG PO CAPS: ORAL_CAPSULE | ORAL | 3 refills | Status: DC

## 2017-01-18 NOTE — Progress Notes (Signed)
Subjective:    Patient ID: Michael [name], male    DOB: 05-06-1955, 62 y.o.   MRN: [id number]  HPI The patient comes in today for a wellness visit.    A review of their health history was completed.  A review of medications was also completed.  Any needed refills; No  Eating habits: Good  Falls/  MVA accidents in past few months: None  Regular exercise: Walks quite a bit  Specialist pt sees on regular basis: Cardiologist [employer]  Preventative health issues were discussed.   Additional concerns: None  Results for orders placed or performed in visit on 12/21/16  Hepatic function panel  Result Value Ref Range   Total Protein 7.6 6.0 - 8.5 g/dL   Albumin 4.9 (H) 3.6 - 4.8 g/dL   Bilirubin Total 0.5 0.0 - 1.2 mg/dL   Bilirubin, Direct 0.16 0.00 - 0.40 mg/dL   Alkaline Phosphatase 64 39 - 117 IU/L   AST 27 0 - 40 IU/L   ALT 24 0 - 44 IU/L  Lipid panel  Result Value Ref Range   Cholesterol, Total 176 100 - 199 mg/dL   Triglycerides 133 0 - 149 mg/dL   HDL 58 >39 mg/dL   VLDL Cholesterol Cal 27 5 - 40 mg/dL   LDL Calculated 91 0 - 99 mg/dL   Chol/HDL Ratio 3.0 0.0 - 5.0 ratio  Basic metabolic panel  Result Value Ref Range   Glucose 93 65 - 99 mg/dL   BUN 14 8 - 27 mg/dL   Creatinine, Ser 0.73 (L) 0.76 - 1.27 mg/dL   GFR calc non Af Amer 99 >59 mL/min/1.73   GFR calc Af Amer 115 >59 mL/min/1.73   BUN/Creatinine Ratio 19 10 - 24   Sodium 143 134 - 144 mmol/L   Potassium 4.1 3.5 - 5.2 mmol/L   Chloride 99 96 - 106 mmol/L   CO2 27 20 - 29 mmol/L   Calcium 9.8 8.6 - 10.2 mg/dL  PSA  Result Value Ref Range   Prostate Specific Ag, Serum 1.1 0.0 - 4.0 ng/mL    Reg walking and exrcis in these days  Does most of  It ,,daily at work       Review of Systems  Constitutional: Negative for activity change, appetite change and fever.  HENT: Negative for congestion and rhinorrhea.   Eyes: Negative for discharge.  Respiratory: Negative for cough and wheezing.     Cardiovascular: Negative for chest pain.  Gastrointestinal: Negative for abdominal pain, blood in stool and vomiting.  Genitourinary: Negative for difficulty urinating and frequency.  Musculoskeletal: Negative for neck pain.  Skin: Negative for rash.  Allergic/Immunologic: Negative for environmental allergies and food allergies.  Neurological: Negative for weakness and headaches.  Psychiatric/Behavioral: Negative for agitation.  All other systems reviewed and are negative.      Objective:   Physical Exam  Constitutional: He appears well-developed and well-nourished.  HENT:  Head: Normocephalic and atraumatic.  Right Ear: External ear normal.  Left Ear: External ear normal.  Nose: Nose normal.  Mouth/Throat: Oropharynx is clear and moist.  Eyes: EOM are normal. Pupils are equal, round, and reactive to light.  Neck: Normal range of motion. Neck supple. No thyromegaly present.  Cardiovascular: Normal rate, regular rhythm and normal heart sounds.  No murmur heard. Pulmonary/Chest: Effort normal and breath sounds normal. No respiratory distress. He has no wheezes.  Abdominal: Soft. Bowel sounds are normal. He exhibits no distension and no mass. There is  no tenderness.  Genitourinary: Penis normal.  Genitourinary Comments: Prostate exam wnl   Musculoskeletal: Normal range of motion. He exhibits no edema.  Lymphadenopathy:    He has no cervical adenopathy.  Neurological: He is alert. He exhibits normal muscle tone.  Skin: Skin is warm and dry. No erythema.  Psychiatric: He has a normal mood and affect. His behavior is normal. Judgment normal.  Vitals reviewed.         Assessment & Plan:  Impression impression wellness exam.  Up-to-date on colonoscopy.  Diet discussed.  Exercise discussed.  Blood work discussed at [employer].  Of note patient has known coronary artery disease along with chronic health concerns followed by cardiologist.  Patient declines vaccinations

## 2017-02-09 ENCOUNTER — Ambulatory Visit (INDEPENDENT_AMBULATORY_CARE_PROVIDER_SITE_OTHER): Payer: 59 | Admitting: Family Medicine

## 2017-02-09 ENCOUNTER — Encounter: Payer: Self-pay | Admitting: Family Medicine

## 2017-02-09 VITALS — BP 130/74 | Temp 98.3°F | Ht 69.5 in | Wt 204.0 lb

## 2017-02-09 DIAGNOSIS — J329 Chronic sinusitis, unspecified: Secondary | ICD-10-CM | POA: Diagnosis not present

## 2017-02-09 DIAGNOSIS — J310 Chronic rhinitis: Secondary | ICD-10-CM

## 2017-02-09 MED ORDER — CEFPROZIL 500 MG PO TABS: 500.0000 mg | ORAL_TABLET | Freq: Two times a day (BID) | ORAL | 0 refills | Status: DC

## 2017-02-09 NOTE — Progress Notes (Signed)
   Subjective:    Patient ID: Michael [name], male    DOB: [birth year], 62 y.o.   MRN: [id number]  Sinusitis  This is a new problem. Episode onset: 4 -5 days. Associated symptoms include congestion and coughing. (Scratchy throat) Treatments tried: nyquil, cough meds.   Did yard work, back was hurting  Then cough and congestion    Nose running cough   Congestion  No sig hadache   No fevdr    Energy level not good   coughin only nildy productive       Review of Systems  HENT: Positive for congestion.   Respiratory: Positive for cough.        Objective:   Physical Exam  Alert, mild malaise. Hydration good Vitals stable. frontal/ maxillary tenderness evident positive nasal congestion. pharynx normal neck supple  lungs clear/no crackles or wheezes. heart regular in rhythm      Assessment & Plan:  Impression rhinosinusitis likely post viral in fact likely post flu, discussed with patient. plan antibiotics prescribed. Questions answered. Symptomatic care discussed. warning signs discussed. [facility]

## 2017-07-18 ENCOUNTER — Other Ambulatory Visit: Payer: Self-pay | Admitting: Cardiology

## 2017-07-18 DIAGNOSIS — I10 Essential (primary) hypertension: Secondary | ICD-10-CM

## 2017-07-18 DIAGNOSIS — E785 Hyperlipidemia, unspecified: Secondary | ICD-10-CM

## 2017-09-04 ENCOUNTER — Telehealth: Payer: Self-pay | Admitting: Cardiology

## 2017-09-04 MED ORDER — SIMVASTATIN 10 MG PO TABS: 10.0000 mg | ORAL_TABLET | Freq: Every day | ORAL | 0 refills | Status: DC

## 2017-09-04 NOTE — Telephone Encounter (Signed)
New Message     *STAT* If patient is at the pharmacy, call can be transferred to refill team.   1. Which medications need to be refilled? (please list name of each medication and dose if known) simvastatin (ZOCOR) 10 MG tablet  2. Which pharmacy/location (including street and city if local pharmacy) is medication to be sent to?[facility], [place]  3. Do they need a 30 day or 90 day supply? [place]

## 2017-09-04 NOTE — Telephone Encounter (Signed)
Pt's medication was sent to pt's pharmacy as requested. Confirmation received.  °

## 2017-09-20 ENCOUNTER — Encounter: Payer: Self-pay | Admitting: Cardiology

## 2017-10-04 ENCOUNTER — Ambulatory Visit: Payer: 59 | Admitting: Cardiology

## 2017-10-04 NOTE — Progress Notes (Deleted)
10/04/2017 Michael [name]   1955-10-04  [id number]  Primary Physician Mikey Kirschner, MD Primary Cardiologist: Dr. Meda Coffee   Reason for Visit/CC: F/u for CAD  HPI:  Michael [name] is a former patient of Dr Verl Blalock. Now followed by Dr. Meda Coffee.  He has h/o CAD s/p CABG in 1999, PAD s/p left carotid endarterectomy in April 2014, hypertension and hyperlipidemia. He underwent repeat left heart cath in June 2015 for abnormal stress test. The cath showed severe native 3VD and all bypass vessels to be patent. His PCP follows carotid dopplers. Last study in 2018 showed stable disease and left carotid artery patency with 1-39% bilateral stenosis.  He is here today for  his 1 year cardiac follow up.  He reports that he has done well,    Cardiac Studies Coronary angiography 06/2013: Coronary dominance: right  Left mainstem: 30% ostial.   Left anterior descending (LAD): The LAD is 100% occluded after the first diagonal.   Left circumflex (LCx): The LCx gives off a small OM 1. The OM 2 is occluded. The LCx has a 95% stenosis in the mid vessel prior to OM 2 then continues in the AV groove.   Right coronary artery (RCA): 100% occlusion proximally.   SVG to the second diagonal is widely patent. The diagonal is a large branch with 50% disease in the mid vessel.   SVG to OM 2 is patent. There is eccentric 30-40% narrowing in the mid graft. This may represent a valve and is not flow limiting.   The RIMA to the RCA is a very large graft and is widely patent. This fills the entire RCA except the PDA which is occluded. The PDA fills by left to right collaterals from the LIMA graft.  The LIMA to the LAD is widely patent.   Left ventriculography: Left ventricular systolic function is normal, LVEF is estimated at 55-65%, there is no significant mitral regurgitation   Final Conclusions:   1. Severe 3 vessel obstructive CAD 2. All grafts are patent including SVG to diagonal 2, SVG to OM 2, RIMA to RCA,  and LIMA to LAD.  3. Normal LV function.   Recommendations: Recommend continued medical therapy. His grafts are functioning very well. The PDA is occluded but fills by collaterals. The LCx in the AV groove has severe disease but does not supply much myocardium.   Michael [name], [place] 06/10/2013, 11:36 AM ________________________________________  2D Echo 01/2012  Study Conclusions  - Left ventricle: The cavity size was normal. Wall thickness was increased in a pattern of mild LVH. Systolic function was normal. The estimated ejection fraction was in the range of 60% to 65%. Wall motion was normal; there were no regional wall motion abnormalities. Left ventricular diastolic function parameters were normal. - Atrial septum: No defect or patent foramen ovale was identified.   No outpatient medications have been marked as taking for the 10/04/17 encounter (Appointment) with Michael Pandy, PA-C.   Allergies  Allergen Reactions  . Anesthetics, Amide Anaphylaxis  . Ramipril Cough  . Losartan    Past Medical History:  Diagnosis Date  . Allergy   . Anginal pain ([place])    last time a yr ago  . Back pain   . Bruises easily   . CAD (coronary artery disease)   . Coronary atherosclerosis of artery bypass graft   . Essential hypertension, benign    Takes Amlodipine and Losartan-HCTZ daily;takes Coreg daily  . GERD (gastroesophageal reflux disease)  was on Prilosec;is not currently on any meds  . Gout    takes Allopurinol daily  . Malignant hyperthermia    Potential but not Confirmed  . Neck pain    yrs ago and saw a chiropractor occasionally  . Other and unspecified hyperlipidemia    takes Simvastatin daily  . Peripheral vascular disease ([place])   . Urinary urgency    Family History  Problem Relation Age of Onset  . Kidney failure Mother   . Ovarian cancer Mother   . Arthritis Mother   . Kidney disease Mother   . Hypertension Mother   . Breast cancer  Mother   . Hypertension Father   . CVA Brother 27  . Hypertension Brother    Past Surgical History:  Procedure Laterality Date  . ANKLE FRACTURE SURGERY Left 1981  . CARDIAC CATHETERIZATION  2015  . CAROTID ENDARTERECTOMY Left 04-29-12   cea  . COLONOSCOPY    . COLONOSCOPY N/A 11/21/2013   Procedure: COLONOSCOPY;  Surgeon: Danie Binder, MD;  [facility];  Service: Endoscopy;  Laterality: N/A;  1:15 PM  . CORONARY ARTERY BYPASS GRAFT  1999   x 4  . CYSTOSCOPY    . ENDARTERECTOMY Left 04/29/2012   Procedure: ENDARTERECTOMY CAROTID;  Surgeon: Rosetta Posner, MD;  Location: [facility];  Service: Vascular;  Laterality: Left;  with primary closure of artery  . ESOPHAGOGASTRODUODENOSCOPY    . FRACTURE SURGERY  1980   screws placed and in 1981 screws removed(this is when was told about potential for [place]  . INGUINAL HERNIA REPAIR Bilateral Aug. 2005  . LEFT HEART CATHETERIZATION WITH CORONARY/GRAFT ANGIOGRAM N/A 06/10/2013   Procedure: LEFT HEART CATHETERIZATION WITH Beatrix Fetters;  Surgeon: Michael M Martinique, MD;  Location: [facility] CATH LAB;  Service: Cardiovascular;  Laterality: N/A;   Social History   Socioeconomic History  . Marital status: Divorced    Spouse name: Not on file  . Number of children: Not on file  . Years of education: Not on file  . Highest education level: Not on file  Occupational History  . Occupation: CONSTRUCTION/MAINT    Employer: [employer]    Comment: [employer]  Social Needs  . Financial resource strain: Not on file  . Food insecurity:    Worry: Not on file    Inability: Not on file  . Transportation needs:    Medical: Not on file    Non-medical: Not on file  Tobacco Use  . Smoking status: Former Smoker    Packs/day: 1.00    Years: 20.00    Pack years: 20.00    Types: Cigarettes    Last attempt to quit: 05/14/1996    Years since quitting: 21.4  . Smokeless tobacco: Never Used  . Tobacco comment: quit 1995  Substance and  Sexual Activity  . Alcohol use: Yes    Alcohol/week: 6.0 standard drinks    Types: 6 Cans of beer per week  . Drug use: No  . Sexual activity: Never  Lifestyle  . Physical activity:    Days per week: Not on file    Minutes per session: Not on file  . Stress: Not on file  Relationships  . Social connections:    Talks on phone: Not on file    Gets together: Not on file    Attends religious service: Not on file    Active member of club or organization: Not on file    Attends meetings of  clubs or organizations: Not on file    Relationship status: Not on file  . Intimate partner violence:    Fear of current or ex partner: Not on file    Emotionally abused: Not on file    Physically abused: Not on file    Forced sexual activity: Not on file  Other Topics Concern  . Not on file  Social History Narrative  . Not on file     Review of Systems: General: negative for chills, fever, night sweats or weight changes.  Cardiovascular: negative for chest pain, dyspnea on exertion, edema, orthopnea, palpitations, paroxysmal nocturnal dyspnea or shortness of breath Dermatological: negative for rash Respiratory: negative for cough or wheezing Urologic: negative for hematuria Abdominal: negative for nausea, vomiting, diarrhea, bright red blood per rectum, melena, or hematemesis Neurologic: negative for visual changes, syncope, or dizziness All other systems reviewed and are otherwise negative except as noted above.   Physical Exam:  There were no vitals taken for this visit.  General appearance: alert, cooperative and no distress Neck: no adenopathy, no carotid bruit and no JVD Lungs: clear to auscultation bilaterally Heart: regular rate and rhythm, S1, S2 normal, no murmur, click, rub or gallop Extremities: extremities normal, atraumatic, no cyanosis or edema Pulses: 2+ and symmetric Skin: Skin color, texture, turgor normal. No rashes or lesions Neurologic: Grossly normal  EKG *** --  personally reviewed   ASSESSMENT AND PLAN:   No problem-specific Assessment & Plan notes found for this encounter.   PLAN  ***  Follow-Up ***  Michael [name], MHS [facility] 10/04/2017 10:30 AM

## 2017-10-08 ENCOUNTER — Ambulatory Visit: Payer: 59 | Admitting: Cardiology

## 2017-10-08 NOTE — Progress Notes (Deleted)
Cardiology Office Note:    Date:  10/08/2017   ID:  Michael [name], DOB 02-Feb-1955, MRN [id number]  PCP:  Mikey Kirschner, MD  Cardiologist:  Ena Dawley, MD  Referring MD: Mikey Kirschner, MD   No chief complaint on file. ***  History of Present Illness:    Michael [name] is a 62 y.o. male with a past medical history significant for CAD, CABG in 1999, PAD, s/p left carotid endarterectomy in April 2014, hypertension and hyperlipidemia.   At his last visit in 07/2016 it was noted that patient had lost over 30 pounds.  He was also found to be allergic to beef and pork.   He is here for his one-year follow-up  Past Medical History:  Diagnosis Date  . Allergy   . Anginal pain ([place])    last time a yr ago  . Back pain   . Bruises easily   . CAD (coronary artery disease)   . Coronary atherosclerosis of artery bypass graft   . Essential hypertension, benign    Takes Amlodipine and Losartan-HCTZ daily;takes Coreg daily  . GERD (gastroesophageal reflux disease)    was on Prilosec;is not currently on any meds  . Gout    takes Allopurinol daily  . Malignant hyperthermia    Potential but not Confirmed  . Neck pain    yrs ago and saw a chiropractor occasionally  . Other and unspecified hyperlipidemia    takes Simvastatin daily  . Peripheral vascular disease ([place])   . Urinary urgency     Past Surgical History:  Procedure Laterality Date  . ANKLE FRACTURE SURGERY Left 1981  . CARDIAC CATHETERIZATION  2015  . CAROTID ENDARTERECTOMY Left 04-29-12   cea  . COLONOSCOPY    . COLONOSCOPY N/A 11/21/2013   Procedure: COLONOSCOPY;  Surgeon: Danie Binder, MD;  [facility];  Service: Endoscopy;  Laterality: N/A;  1:15 PM  . CORONARY ARTERY BYPASS GRAFT  1999   x 4  . CYSTOSCOPY    . ENDARTERECTOMY Left 04/29/2012   Procedure: ENDARTERECTOMY CAROTID;  Surgeon: Rosetta Posner, MD;  Location: [facility];  Service: Vascular;  Laterality: Left;  with primary closure of artery  .  ESOPHAGOGASTRODUODENOSCOPY    . FRACTURE SURGERY  1980   screws placed and in 1981 screws removed(this is when was told about potential for [place]  . INGUINAL HERNIA REPAIR Bilateral Aug. 2005  . LEFT HEART CATHETERIZATION WITH CORONARY/GRAFT ANGIOGRAM N/A 06/10/2013   Procedure: LEFT HEART CATHETERIZATION WITH Beatrix Fetters;  Surgeon: Peter M Martinique, MD;  Location: [facility] CATH LAB;  Service: Cardiovascular;  Laterality: N/A;    Current Medications: No outpatient medications have been marked as taking for the 10/08/17 encounter (Appointment) with Daune Perch, NP.     Allergies:   Anesthetics, amide; Ramipril; and Losartan   Social History   Socioeconomic History  . Marital status: Divorced    Spouse name: Not on file  . Number of children: Not on file  . Years of education: Not on file  . Highest education level: Not on file  Occupational History  . Occupation: CONSTRUCTION/MAINT    Employer: [employer]    Comment: [employer]  Social Needs  . Financial resource strain: Not on file  . Food insecurity:    Worry: Not on file    Inability: Not on file  . Transportation needs:    Medical: Not on file    Non-medical: Not on file  Tobacco Use  . Smoking status: Former Smoker    Packs/day: 1.00    Years: 20.00    Pack years: 20.00    Types: Cigarettes    Last attempt to quit: 05/14/1996    Years since quitting: 21.4  . Smokeless tobacco: Never Used  . Tobacco comment: quit 1995  Substance and Sexual Activity  . Alcohol use: Yes    Alcohol/week: 6.0 standard drinks    Types: 6 Cans of beer per week  . Drug use: No  . Sexual activity: Never  Lifestyle  . Physical activity:    Days per week: Not on file    Minutes per session: Not on file  . Stress: Not on file  Relationships  . Social connections:    Talks on phone: Not on file    Gets together: Not on file    Attends religious service: Not on file    Active member of club or organization: Not on  file    Attends meetings of clubs or organizations: Not on file    Relationship status: Not on file  Other Topics Concern  . Not on file  Social History Narrative  . Not on file     Family History: The patient's family history includes Arthritis in his mother; Breast cancer in his mother; CVA (age of onset: 67) in his brother; Hypertension in his brother, father, and mother; Kidney disease in his mother; Kidney failure in his mother; Ovarian cancer in his mother. ROS:   Please see the history of present illness.     All other systems reviewed and are negative.  EKGs/Labs/Other Studies Reviewed:    The following studies were reviewed today:  Echocardiogram 01/04/2012 Study Conclusions  - Left ventricle: The cavity size was normal. Wall thickness was increased in a pattern of mild LVH. Systolic function was normal. The estimated ejection fraction was in the range of 60% to 65%. Wall motion was normal; there were no regional wall motion abnormalities. Left ventricular diastolic function parameters were normal.  ECG - normal sinus rhythm, 83 beats per minute, nonspecific T wave abnormality.  Exercise nuclear stress test 12/12/2013 Exercise Capacity: Good exercise capacity. BP Response: Baseline HTN with Hypertensive blood pressure  response. DBP increased from 85 - 117 mmHg; SBP 157 - 193 mmHg Clinical Symptoms: The exercise was limited by dyspnea &  fatigue. No anginal pain.. ECG Impression: Significant ST abnormalities consistent with  ischemia. Comparison with Prior Nuclear Study: No images to compare LV Wall Motion: NL LV Function; NL Wall Motion  Overall Impression: Low to Intermediate risk stress nuclear  study Abnormal ECG portion of the Exercise Myoview with notable  ST depression concerning for ischemia along with PVCs in singlets & couplet that were more prominent . There is no Scintigraphic  finding to suggest ischemia, but the presence of significant  splanchnic  attenuation makes the study less interpretable.  Cardiac cath 06/10/2013 Left ventriculography: Left ventricular systolic function is normal, LVEF is estimated at 55-65%, there is no significant mitral regurgitation   Final Conclusions:  1. Severe 3 vessel obstructive CAD 2. All grafts are patent including SVG to diagonal 2, SVG to OM 2, RIMA to RCA, and LIMA to LAD.  3. Normal LV function.   Recommendations: Recommend continued medical therapy. His grafts are functioning very well. The PDA is occluded but fills by collaterals. The LCx in the AV groove has severe disease but does not supply much myocardium.   Peter M Martinique, [place]  EKG:  EKG is *** ordered today.  The ekg ordered today demonstrates ***  Recent Labs: 12/21/2016: ALT 24; BUN 14; Creatinine, Ser 0.73; Potassium 4.1; Sodium 143   Recent Lipid Panel    Component Value Date/Time   CHOL 176 12/21/2016 1410   TRIG 133 12/21/2016 1410   HDL 58 12/21/2016 1410   CHOLHDL 3.0 12/21/2016 1410   CHOLHDL 4.0 01/05/2015 0855   VLDL 28 01/05/2015 0855   LDLCALC 91 12/21/2016 1410    Physical Exam:    VS:  There were no vitals taken for this visit.    Wt Readings from Last 3 Encounters:  02/09/17 204 lb (92.5 kg)  01/18/17 204 lb 0.8 oz (92.6 kg)  12/21/16 207 lb (93.9 kg)     Physical Exam***   ASSESSMENT:    1. Coronary artery disease due to lipid rich plaque   2. HYPERTENSION, CONTROLLED   3. S/P carotid endarterectomy   4. Other hyperlipidemia    PLAN:    In order of problems listed above:  CAD S/P CABG 1999: On beta-blocker, aspirin, statin  Hypertension: Avoiding ACE/ARB due to allergy. (Discontinue hydralazine if blood pressure better with weight loss?)  S/P carotid endarterectomy: On aspirin and statin  Hyperlipidemia: LDL 91 in 12/2016   Medication Adjustments/Labs and Tests Ordered: Current medicines are reviewed at length with the patient today.  Concerns regarding medicines are  outlined above. Labs and tests ordered and medication changes are outlined in the patient instructions below:  There are no Patient Instructions on file for this visit.   Signed, Daune Perch, NP  10/08/2017 5:17 AM    [place]

## 2017-10-10 ENCOUNTER — Encounter: Payer: Self-pay | Admitting: Cardiology

## 2017-10-10 ENCOUNTER — Ambulatory Visit (INDEPENDENT_AMBULATORY_CARE_PROVIDER_SITE_OTHER): Payer: 59 | Admitting: Cardiology

## 2017-10-10 VITALS — BP 132/78 | HR 66 | Ht 69.5 in | Wt 206.8 lb

## 2017-10-10 DIAGNOSIS — Z9889 Other specified postprocedural states: Secondary | ICD-10-CM

## 2017-10-10 DIAGNOSIS — I2510 Atherosclerotic heart disease of native coronary artery without angina pectoris: Secondary | ICD-10-CM | POA: Diagnosis not present

## 2017-10-10 DIAGNOSIS — I2583 Coronary atherosclerosis due to lipid rich plaque: Secondary | ICD-10-CM

## 2017-10-10 DIAGNOSIS — E782 Mixed hyperlipidemia: Secondary | ICD-10-CM

## 2017-10-10 DIAGNOSIS — I10 Essential (primary) hypertension: Secondary | ICD-10-CM

## 2017-10-10 MED ORDER — SIMVASTATIN 10 MG PO TABS: 10.0000 mg | ORAL_TABLET | Freq: Every day | ORAL | 3 refills | Status: DC

## 2017-10-10 MED ORDER — CARVEDILOL 25 MG PO TABS: ORAL_TABLET | ORAL | 3 refills | Status: DC

## 2017-10-10 MED ORDER — ALLOPURINOL 300 MG PO TABS: 300.0000 mg | ORAL_TABLET | Freq: Every day | ORAL | 3 refills | Status: DC

## 2017-10-10 NOTE — Patient Instructions (Signed)
Medication Instructions:  Your physician recommends that you continue on your current medications as directed. Please refer to the Current Medication list given to you today.  If you need a refill on your cardiac medications before your next appointment, please call your pharmacy.   Lab work: None Ordered If you have labs (blood work) drawn today and your tests are completely normal, you will receive your results only by: Marland Kitchen MyChart Message (if you have MyChart) OR . A paper copy in the mail If you have any lab test that is abnormal or we need to change your treatment, we will call you to review the results.  Testing/Procedures: None Ordered  Follow-Up: At [facility], you and your health needs are our priority.  As part of our continuing mission to provide you with exceptional heart care, we have created designated Provider Care Teams.  These Care Teams include your primary Cardiologist (physician) and Advanced Practice Providers (APPs -  Physician Assistants and Nurse Practitioners) who all work together to provide you with the care you need, when you need it. You will need a follow up appointment in 1 year. Please call our office 2 months in advance to schedule this appointment.  You may see Ena Dawley, MD or one of the following Advanced Practice Providers on your designated Care Team:   [place], PA-C Melina Copa, PA-C . Ermalinda Barrios, PA-C  Any Other Special Instructions Will Be Listed Below (If Applicable).   DASH Eating Plan DASH stands for "Dietary Approaches to Stop Hypertension." The DASH eating plan is a healthy eating plan that has been shown to reduce high blood pressure (hypertension). It may also reduce your risk for type 2 diabetes, heart disease, and stroke. The DASH eating plan may also help with weight loss. What are tips for following this plan? General guidelines  Avoid eating more than 2,300 mg (milligrams) of salt (sodium) a day. If you have  hypertension, you may need to reduce your sodium intake to 1,500 mg a day.  Limit alcohol intake to no more than 1 drink a day for nonpregnant women and 2 drinks a day for men. One drink equals 12 oz of beer, 5 oz of wine, or 1 oz of hard liquor.  Work with your health care provider to maintain a healthy body weight or to lose weight. Ask what an ideal weight is for you.  Get at least 30 minutes of exercise that causes your heart to beat faster (aerobic exercise) most days of the week. Activities may include walking, swimming, or biking.  Work with your health care provider or diet and nutrition specialist (dietitian) to adjust your eating plan to your individual calorie needs. Reading food labels  Check food labels for the amount of sodium per serving. Choose foods with less than 5 percent of the Daily Value of sodium. Generally, foods with less than 300 mg of sodium per serving fit into this eating plan.  To find whole grains, look for the word "whole" as the first word in the ingredient list. Shopping  Buy products labeled as "low-sodium" or "no salt added."  Buy fresh foods. Avoid canned foods and premade or frozen meals. Cooking  Avoid adding salt when cooking. Use salt-free seasonings or herbs instead of table salt or sea salt. Check with your health care provider or pharmacist before using salt substitutes.  Do not fry foods. Cook foods using healthy methods such as baking, boiling, grilling, and broiling instead.  Cook with heart-healthy oils,  such as olive, canola, soybean, or sunflower oil. Meal planning   Eat a balanced diet that includes: ? 5 or more servings of fruits and vegetables each day. At each meal, try to fill half of your plate with fruits and vegetables. ? Up to 6-8 servings of whole grains each day. ? Less than 6 oz of lean meat, poultry, or fish each day. A 3-oz serving of meat is about the same size as a deck of cards. One egg equals 1 oz. ? 2 servings of  low-fat dairy each day. ? A serving of nuts, seeds, or beans 5 times each week. ? Heart-healthy fats. Healthy fats called Omega-3 fatty acids are found in foods such as flaxseeds and coldwater fish, like sardines, salmon, and mackerel.  Limit how much you eat of the following: ? Canned or prepackaged foods. ? Food that is high in trans fat, such as fried foods. ? Food that is high in saturated fat, such as fatty meat. ? Sweets, desserts, sugary drinks, and other foods with added sugar. ? Full-fat dairy products.  Do not salt foods before eating.  Try to eat at least 2 vegetarian meals each week.  Eat more home-cooked food and less restaurant, buffet, and fast food.  When eating at a restaurant, ask that your food be prepared with less salt or no salt, if possible. What foods are recommended? The items listed may not be a complete list. Talk with your dietitian about what dietary choices are best for you. Grains Whole-grain or whole-wheat bread. Whole-grain or whole-wheat pasta. Brown rice. [place]row. Bulgur. Whole-grain and low-sodium cereals. Pita bread. Low-fat, low-sodium crackers. Whole-wheat flour tortillas. Vegetables Fresh or frozen vegetables (raw, steamed, roasted, or grilled). Low-sodium or reduced-sodium tomato and vegetable juice. Low-sodium or reduced-sodium tomato sauce and tomato paste. Low-sodium or reduced-sodium canned vegetables. Fruits All fresh, dried, or frozen fruit. Canned fruit in natural juice (without added sugar). Meat and other protein foods Skinless chicken or [place]. Ground chicken or [place]. Pork with fat trimmed off. Fish and seafood. Egg whites. Dried beans, peas, or lentils. Unsalted nuts, nut butters, and seeds. Unsalted canned beans. Lean cuts of beef with fat trimmed off. Low-sodium, lean deli meat. Dairy Low-fat (1%) or fat-free (skim) milk. Fat-free, low-fat, or reduced-fat cheeses. Nonfat, low-sodium ricotta or cottage cheese. Low-fat or  nonfat yogurt. Low-fat, low-sodium cheese. Fats and oils Soft margarine without trans fats. Vegetable oil. Low-fat, reduced-fat, or light mayonnaise and salad dressings (reduced-sodium). Canola, safflower, olive, soybean, and sunflower oils. Avocado. Seasoning and other foods Herbs. Spices. Seasoning mixes without salt. Unsalted popcorn and pretzels. Fat-free sweets. What foods are not recommended? The items listed may not be a complete list. Talk with your dietitian about what dietary choices are best for you. Grains Baked goods made with fat, such as croissants, muffins, or some breads. Dry pasta or rice meal packs. Vegetables Creamed or fried vegetables. Vegetables in a cheese sauce. Regular canned vegetables (not low-sodium or reduced-sodium). Regular canned tomato sauce and paste (not low-sodium or reduced-sodium). Regular tomato and vegetable juice (not low-sodium or reduced-sodium). Angie Fava. Olives. Fruits Canned fruit in a light or heavy syrup. Fried fruit. Fruit in cream or butter sauce. Meat and other protein foods Fatty cuts of meat. Ribs. Fried meat. Berniece Salines. Sausage. Bologna and other processed lunch meats. Salami. Fatback. Hotdogs. Bratwurst. Salted nuts and seeds. Canned beans with added salt. Canned or smoked fish. Whole eggs or egg yolks. Chicken or [place] with skin. Dairy Whole or 2% milk,  cream, and half-and-half. Whole or full-fat cream cheese. Whole-fat or sweetened yogurt. Full-fat cheese. Nondairy creamers. Whipped toppings. Processed cheese and cheese spreads. Fats and oils Butter. Stick margarine. Lard. Shortening. Ghee. Bacon fat. Tropical oils, such as coconut, palm kernel, or palm oil. Seasoning and other foods Salted popcorn and pretzels. Onion salt, garlic salt, seasoned salt, table salt, and sea salt. Worcestershire sauce. Tartar sauce. Barbecue sauce. Teriyaki sauce. Soy sauce, including reduced-sodium. Steak sauce. Canned and packaged gravies. Fish sauce. Oyster  sauce. Cocktail sauce. Horseradish that you find on the shelf. Ketchup. Mustard. Meat flavorings and tenderizers. Bouillon cubes. Hot sauce and Tabasco sauce. Premade or packaged marinades. Premade or packaged taco seasonings. Relishes. Regular salad dressings. Where to find more information:  National Heart, Lung, and [place]: https://wilson-eaton.com/  American Heart Association: www.heart.org Summary  The DASH eating plan is a healthy eating plan that has been shown to reduce high blood pressure (hypertension). It may also reduce your risk for type 2 diabetes, heart disease, and stroke.  With the DASH eating plan, you should limit salt (sodium) intake to 2,300 mg a day. If you have hypertension, you may need to reduce your sodium intake to 1,500 mg a day.  When on the DASH eating plan, aim to eat more fresh fruits and vegetables, whole grains, lean proteins, low-fat dairy, and heart-healthy fats.  Work with your health care provider or diet and nutrition specialist (dietitian) to adjust your eating plan to your individual calorie needs. This information is not intended to replace advice given to you by your health care provider. Make sure you discuss any questions you have with your health care provider. Document Released: 12/08/2010 Document Revised: 12/13/2015 Document Reviewed: 12/13/2015 Elsevier Interactive Patient Education  2018 [employer].  '

## 2017-10-10 NOTE — Progress Notes (Signed)
Cardiology Office Note:    Date:  10/08/2017   ID:  Michael [name], DOB 1955/08/15, MRN [id number]  PCP:  Mikey Kirschner, MD   Cardiologist:  Ena Dawley, MD  Referring MD: Mikey Kirschner, MD   Chief Complaint: annual follow up for CAD   History of Present Illness:    Michael [name] is a 62 y.o. male with a past medical history significant for CAD, CABG in 1999, PAD, s/p left carotid endarterectomy in April 2014, hypertension and hyperlipidemia.   At his last visit in 07/2016 it was noted that patient had lost over 30 pounds.  He was also found to be allergic to beef and pork. He takes Cetirizine which keeps his allergy in check. He still eats beef and pork.   He is here for his one-year follow-up. He still works in Systems developer. He walks a lot on the job and walks outside or uses stationary bike at home. No chest pain/pressure/tightness or shortness of breath with exertion. No palpitations, lightheadedness, syncope, orthopnea, PND or edema. He feels well and is mainly here for med refill.       Past Medical History:  Diagnosis Date  . Allergy   . Anginal pain ([place])    last time a yr ago  . Back pain   . Bruises easily   . CAD (coronary artery disease)   . Coronary atherosclerosis of artery bypass graft   . Essential hypertension, benign    Takes Amlodipine and Losartan-HCTZ daily;takes Coreg daily  . GERD (gastroesophageal reflux disease)    was on Prilosec;is not currently on any meds  . Gout    takes Allopurinol daily  . Malignant hyperthermia    Potential but not Confirmed  . Neck pain    yrs ago and saw a chiropractor occasionally  . Other and unspecified hyperlipidemia    takes Simvastatin daily  . Peripheral vascular disease ([place])   . Urinary urgency          Past Surgical History:  Procedure Laterality Date  . ANKLE FRACTURE SURGERY Left 1981  . CARDIAC CATHETERIZATION  2015  . CAROTID ENDARTERECTOMY Left  04-29-12   cea  . COLONOSCOPY    . COLONOSCOPY N/A 11/21/2013   Procedure: COLONOSCOPY;  Surgeon: Danie Binder, MD;  Location[facility];  Service: Endoscopy;  Laterality: N/A;  1:15 PM  . CORONARY ARTERY BYPASS GRAFT  1999   x 4  . CYSTOSCOPY    . ENDARTERECTOMY Left 04/29/2012   Procedure: ENDARTERECTOMY CAROTID;  Surgeon: Rosetta Posner, MD;  Location: [facility];  Service: Vascular;  Laterality: Left;  with primary closure of artery  . ESOPHAGOGASTRODUODENOSCOPY    . FRACTURE SURGERY  1980   screws placed and in 1981 screws removed(this is when was told about potential for Kirk  . INGUINAL HERNIA REPAIR Bilateral Aug. 2005  . LEFT HEART CATHETERIZATION WITH CORONARY/GRAFT ANGIOGRAM N/A 06/10/2013   Procedure: LEFT HEART CATHETERIZATION WITH Beatrix Fetters;  Surgeon: Peter M Martinique, MD;  Location: [facility] C[facility] LAB;  Service: Cardiovascular;  Laterality: N/A;    Current Medications:    Allergies as of 10/10/2017      Reactions   Anesthetics, Amide Anaphylaxis   Ramipril Cough   Losartan       Medication List        Accurate as of 10/10/17 12:03 PM. Always use your most recent med list.          allopurinol 300 MG  tablet Commonly known as:  ZYLOPRIM Take 1 tablet (300 mg total) by mouth daily.   aspirin EC 81 MG tablet Take 1 tablet (81 mg total) by mouth daily.   carvedilol 25 MG tablet Commonly known as:  COREG TAKE 1 TABLET TWICE A DAY WITH MEALS   cetirizine 10 MG tablet Commonly known as:  ZYRTEC Take 10 mg by mouth daily.   EPINEPHrine 0.3 mg/0.3 mL Soaj injection Commonly known as:  EPI-PEN Use as directed for life-threatening allergic reaction.   famotidine 10 MG tablet Commonly known as:  PEPCID Take 10 mg by mouth 2 (two) times daily.   simvastatin 10 MG tablet Commonly known as:  ZOCOR Take 1 tablet (10 mg total) by mouth at bedtime.       Allergies:   Anesthetics, amide; Ramipril; and Losartan   Social History          Socioeconomic History  . Marital status: Divorced    Spouse name: Not on file  . Number of children: Not on file  . Years of education: Not on file  . Highest education level: Not on file  Occupational History  . Occupation: CONSTRUCTION/MAINT    Employer: [employer]    Comment: [employer]  Social Needs  . Financial resource strain: Not on file  . Food insecurity:    Worry: Not on file    Inability: Not on file  . Transportation needs:    Medical: Not on file    Non-medical: Not on file  Tobacco Use  . Smoking status: Former Smoker    Packs/day: 1.00    Years: 20.00    Pack years: 20.00    Types: Cigarettes    Last attempt to quit: 05/14/1996    Years since quitting: 21.4  . Smokeless tobacco: Never Used  . Tobacco comment: quit 1995  Substance and Sexual Activity  . Alcohol use: Yes    Alcohol/week: 6.0 standard drinks    Types: 6 Cans of beer per week  . Drug use: No  . Sexual activity: Never  Lifestyle  . Physical activity:    Days per week: Not on file    Minutes per session: Not on file  . Stress: Not on file  Relationships  . Social connections:    Talks on phone: Not on file    Gets together: Not on file    Attends religious service: Not on file    Active member of club or organization: Not on file    Attends meetings of clubs or organizations: Not on file    Relationship status: Not on file  Other Topics Concern  . Not on file  Social History Narrative  . Not on file     Family History: The patient's family history includes Arthritis in his mother; Breast cancer in his mother; CVA (age of onset: 43) in his brother; Hypertension in his brother, father, and mother; Kidney disease in his mother; Kidney failure in his mother; Ovarian cancer in his mother. ROS:   Please see the history of present illness.     All other systems reviewed and are negative.  EKGs/Labs/Other Studies Reviewed:     The following studies were reviewed today:  Echocardiogram 01/04/2012 Study Conclusions  - Left ventricle: The cavity size was normal. Wall thickness was increased in a pattern of mild LVH. Systolic function was normal. The estimated ejection fraction was in the range of 60% to 65%. Wall motion was normal; there were no  regional wall motion abnormalities. Left ventricular diastolic function parameters were normal.  ECG- normal sinus rhythm, 83 beats per minute, nonspecific T wave abnormality.  Exercise nuclear stress test 12/12/2013 Exercise Capacity: Good exercise capacity. BP Response: Baseline HTN with Hypertensive blood pressure  response. DBP increased from 85 - 117 mmHg; SBP 157 - 193 mmHg Clinical Symptoms: The exercise was limited by dyspnea &  fatigue. No anginal pain.. ECG Impression: Significant ST abnormalities consistent with  ischemia. Comparison with Prior Nuclear Study: No images to compare LV Wall Motion: NL LV Function; NL Wall Motion  Overall Impression: Low to Intermediate risk stress nuclear  study Abnormal ECG portion of the Exercise Myoview with notable  ST depression concerning for ischemia along with PVCs in singlets & couplet that were more prominent . There is no Scintigraphic  finding to suggest ischemia, but the presence of significant  splanchnic attenuation makes the study less interpretable.  Cardiac cath 06/10/2013 Left ventriculography: Left ventricular systolic function is normal, LVEF is estimated at 55-65%, there is no significant mitral regurgitation   Final Conclusions:  1. Severe 3 vessel obstructive CAD 2. All grafts are patent including SVG to diagonal 2, SVG to OM 2, RIMA to RCA, and LIMA to LAD.  3. Normal LV function.   Recommendations: Recommend continued medical therapy. His grafts are functioning very well. The PDA is occluded but fills by collaterals. The LCx in the AV groove has severe disease but does not supply  much myocardium.   Peter M Martinique, MDFACC  EKG:  EKG is  ordered today.  The ekg ordered today demonstrates NSR, 64 bpm, with non-specific lateral T wave flattening  Recent Labs: 12/21/2016: ALT 24; BUN 14; Creatinine, Ser 0.73; Potassium 4.1; Sodium 143   Recent Lipid Panel         Component Value Date/Time   CHOL 176 12/21/2016 1410   TRIG 133 12/21/2016 1410   HDL 58 12/21/2016 1410   CHOLHDL 3.0 12/21/2016 1410   CHOLHDL 4.0 01/05/2015 0855   VLDL 28 01/05/2015 0855   LDLCALC 91 12/21/2016 1410    Physical Exam:    VS:   BP 132/78   Pulse 66   Ht 5' 9.5" (1.765 m)   Wt 206 lb 12 oz (93.8 kg)   SpO2 99%   BMI 30.09 kg/m       Wt Readings from Last 3 Encounters:  02/09/17 204 lb (92.5 kg)  01/18/17 204 lb 0.8 oz (92.6 kg)  12/21/16 207 lb (93.9 kg)     Physical Exam  Constitutional: He is oriented to person, place, and time. He appears well-developed and well-nourished. No distress.  HENT:  Head: Normocephalic and atraumatic.  Neck: Normal range of motion. Neck supple. No JVD present.  Cardiovascular: Normal rate, regular rhythm, normal heart sounds and intact distal pulses. Exam reveals no gallop and no friction rub.  No murmur heard. Pulmonary/Chest: Effort normal and breath sounds normal.  Abdominal: Soft. Bowel sounds are normal.  Musculoskeletal: Normal range of motion. He exhibits no edema or deformity.  Neurological: He is alert and oriented to person, place, and time.  Skin: Skin is warm and dry.  Psychiatric: He has a normal mood and affect. His behavior is normal. Judgment and thought content normal.  Vitals reviewed.   ASSESSMENT:    1. Coronary artery disease due to lipid rich plaque   2. HYPERTENSION, CONTROLLED   3. S/P carotid endarterectomy   4. Other hyperlipidemia    PLAN:  In order of problems listed above:  CAD S/P CABG 1999: On beta-blocker, aspirin, statin. He is active with no exertional symptoms.    Hypertension: Avoiding ACE/ARB due to allergy. BP well controlled on carvedilol.   Carotid artery disease: On aspirin and statin. Last carotid dopplers in 07/2016 with patent left carotid endarterectomy and minimal stenosis in the right. No carotid bruits.   Hyperlipidemia: LDL 91 in 12/2016. Goal LDL <70. Pt does not want to increase statin to achieve goal. He works on diet and exercise.    Medication Adjustments/Labs and Tests Ordered: Current medicines are reviewed at length with the patient today.  Concerns regarding medicines are outlined above. Labs and tests ordered and medication changes are outlined in the patient instructions below:  There are no Patient Instructions on file for this visit.   Signed, Daune Perch, NP  10/08/2017 5:17 AM    [place]

## 2018-01-01 ENCOUNTER — Encounter: Payer: Self-pay | Admitting: Family Medicine

## 2018-01-01 ENCOUNTER — Ambulatory Visit (INDEPENDENT_AMBULATORY_CARE_PROVIDER_SITE_OTHER): Payer: 59 | Admitting: Family Medicine

## 2018-01-01 VITALS — BP 138/88 | Temp 98.2°F | Wt 212.4 lb

## 2018-01-01 DIAGNOSIS — J019 Acute sinusitis, unspecified: Secondary | ICD-10-CM

## 2018-01-01 MED ORDER — AMOXICILLIN 500 MG PO TABS: 500.0000 mg | ORAL_TABLET | Freq: Three times a day (TID) | ORAL | 0 refills | Status: DC

## 2018-01-01 NOTE — Progress Notes (Signed)
   Subjective:    Patient ID: Michael [name], male    DOB: 06-26-1955, 62 y.o.   MRN: [id number]  Cough  This is a new problem. The current episode started in the past 7 days. The cough is productive of sputum. Associated symptoms include nasal congestion, rhinorrhea and a sore throat. Pertinent negatives include no chest pain, chills, ear pain, fever or wheezing. Associated symptoms comments: sneezing. Treatments tried: Dayquil, Nyquil. The treatment provided mild relief.  Significant head congestion drainage coughing denies high fever chills sweats wheezing states energy level overall fair moderate sinus pressure    Review of Systems  Constitutional: Negative for activity change, chills and fever.  HENT: Positive for congestion, rhinorrhea and sore throat. Negative for ear pain.   Eyes: Negative for discharge.  Respiratory: Positive for cough. Negative for wheezing.   Cardiovascular: Negative for chest pain.  Gastrointestinal: Negative for nausea and vomiting.  Musculoskeletal: Negative for arthralgias.       Objective:   Physical Exam Vitals signs and nursing note reviewed.  Constitutional:      Appearance: He is well-developed.  HENT:     Head: Normocephalic.     Mouth/Throat:     Pharynx: No oropharyngeal exudate.  Neck:     Musculoskeletal: Normal range of motion.  Cardiovascular:     Rate and Rhythm: Normal rate and regular rhythm.     Heart sounds: Normal heart sounds. No murmur.  Pulmonary:     Effort: Pulmonary effort is normal.     Breath sounds: Normal breath sounds. No wheezing.  Lymphadenopathy:     Cervical: No cervical adenopathy.  Skin:    General: Skin is warm and dry.  Neurological:     Motor: No abnormal muscle tone.    More than likely start off with a viral illness but I believe it is triggering some mild sinus       Assessment & Plan:  Patient was seen today for upper respiratory illness. It is felt that the patient is dealing with sinusitis.    Antibiotics were prescribed today. Importance of compliance with medication was discussed.  Symptoms should gradually resolve over the course of the next several days. If high fevers, progressive illness, difficulty breathing, worsening condition or failure for symptoms to improve over the next several days then the patient is to follow-up.  If any emergent conditions the patient is to follow-up in the emergency department otherwise to follow-up in the office.

## 2018-01-29 ENCOUNTER — Ambulatory Visit (HOSPITAL_COMMUNITY)
Admission: RE | Admit: 2018-01-29 | Discharge: 2018-01-29 | Disposition: A | Discharge status: Home / Self Care | Payer: 59 | Source: Ambulatory Visit | Attending: Family | Admitting: Family

## 2018-01-29 ENCOUNTER — Other Ambulatory Visit: Payer: Self-pay

## 2018-01-29 ENCOUNTER — Encounter: Payer: Self-pay | Admitting: Family

## 2018-01-29 ENCOUNTER — Ambulatory Visit (INDEPENDENT_AMBULATORY_CARE_PROVIDER_SITE_OTHER): Payer: 59 | Admitting: Physician Assistant

## 2018-01-29 VITALS — BP 143/82 | HR 66 | Temp 98.2°F | Resp 16 | Ht 70.0 in | Wt 200.0 lb

## 2018-01-29 DIAGNOSIS — Z9889 Other specified postprocedural states: Secondary | ICD-10-CM | POA: Insufficient documentation

## 2018-01-29 DIAGNOSIS — I6522 Occlusion and stenosis of left carotid artery: Secondary | ICD-10-CM

## 2018-01-29 DIAGNOSIS — Z87891 Personal history of nicotine dependence: Secondary | ICD-10-CM | POA: Diagnosis not present

## 2018-01-29 NOTE — Progress Notes (Signed)
History of Present Illness:  Patient is a 63 y.o. year old male who presents for evaluation of carotid stenosis s/p left CEA on 04/29/12 by Dr. Donnetta Hutching.  The patient denies symptoms of TIA, amaurosis, or stroke.  The patient is currently on aspirin antiplatelet therapy.      Other medical problems include HTN,Hyperlipidemia and CAD s/p CABG.  He denies changes in his medical history since his last visit.    Past Medical History:  Diagnosis Date  . Allergy   . Anginal pain ([place])    last time a yr ago  . Back pain   . Bruises easily   . CAD (coronary artery disease)   . Coronary atherosclerosis of artery bypass graft   . Essential hypertension, benign    Takes Amlodipine and Losartan-HCTZ daily;takes Coreg daily  . GERD (gastroesophageal reflux disease)    was on Prilosec;is not currently on any meds  . Gout    takes Allopurinol daily  . Malignant hyperthermia    Potential but not Confirmed  . Neck pain    yrs ago and saw a chiropractor occasionally  . Other and unspecified hyperlipidemia    takes Simvastatin daily  . Peripheral vascular disease ([place])   . Urinary urgency     Past Surgical History:  Procedure Laterality Date  . ANKLE FRACTURE SURGERY Left 1981  . CARDIAC CATHETERIZATION  2015  . CAROTID ENDARTERECTOMY Left 04-29-12   cea  . COLONOSCOPY    . COLONOSCOPY N/A 11/21/2013   Procedure: COLONOSCOPY;  Surgeon: Danie Binder, MD;  [facility];  Service: Endoscopy;  Laterality: N/A;  1:15 PM  . CORONARY ARTERY BYPASS GRAFT  1999   x 4  . CYSTOSCOPY    . ENDARTERECTOMY Left 04/29/2012   Procedure: ENDARTERECTOMY CAROTID;  Surgeon: Rosetta Posner, MD;  Location: [place];  Service: Vascular;  Laterality: Left;  with primary closure of artery  . ESOPHAGOGASTRODUODENOSCOPY    . FRACTURE SURGERY  1980   screws placed and in 1981 screws removed(this is when was told about potential for [place]  . INGUINAL HERNIA REPAIR Bilateral Aug. 2005  . LEFT HEART  CATHETERIZATION WITH CORONARY/GRAFT ANGIOGRAM N/A 06/10/2013   Procedure: LEFT HEART CATHETERIZATION WITH Beatrix Fetters;  Surgeon: Peter M Martinique, MD;  Location: [facility] CATH LAB;  Service: Cardiovascular;  Laterality: N/A;     Social History Social History   Tobacco Use  . Smoking status: Former Smoker    Packs/day: 1.00    Years: 20.00    Pack years: 20.00    Types: Cigarettes    Last attempt to quit: 05/14/1996    Years since quitting: 21.7  . Smokeless tobacco: Never Used  . Tobacco comment: quit 1995  Substance Use Topics  . Alcohol use: Yes    Alcohol/week: 6.0 standard drinks    Types: 6 Cans of beer per week  . Drug use: No    Family History Family History  Problem Relation Age of Onset  . Kidney failure Mother   . Ovarian cancer Mother   . Arthritis Mother   . Kidney disease Mother   . Hypertension Mother   . Breast cancer Mother   . Hypertension Father   . CVA Brother 62  . Hypertension Brother     Allergies  Allergies  Allergen Reactions  . Anesthetics, Amide Anaphylaxis  . Ramipril Cough  . Losartan      Current Outpatient Medications  Medication Sig Dispense  Refill  . allopurinol (ZYLOPRIM) 300 MG tablet Take 1 tablet (300 mg total) by mouth daily. 90 tablet 3  . amoxicillin (AMOXIL) 500 MG tablet Take 1 tablet (500 mg total) by mouth 3 (three) times daily. 30 tablet 0  . aspirin EC 81 MG tablet Take 1 tablet (81 mg total) by mouth daily. 90 tablet 3  . carvedilol (COREG) 25 MG tablet TAKE 1 TABLET TWICE A DAY WITH MEALS 180 tablet 3  . cetirizine (ZYRTEC) 10 MG tablet Take 10 mg by mouth daily.    Marland Kitchen EPINEPHrine 0.3 mg/0.3 mL IJ SOAJ injection Use as directed for life-threatening allergic reaction. 2 Device 3  . famotidine (PEPCID) 10 MG tablet Take 10 mg by mouth 2 (two) times daily.    . simvastatin (ZOCOR) 10 MG tablet Take 1 tablet (10 mg total) by mouth at bedtime. 90 tablet 3  . penicillin v potassium (VEETID) 500 MG tablet      No  current facility-administered medications for this visit.     ROS:   General:  No weight loss, Fever, chills  HEENT: No recent headaches, no nasal bleeding, no visual changes, no sore throat  Neurologic: No dizziness, blackouts, seizures. No recent symptoms of stroke or mini- stroke. No recent episodes of slurred speech, or temporary blindness.  Cardiac: No recent episodes of chest pain/pressure, no shortness of breath at rest.  No shortness of breath with exertion.  Denies history of atrial fibrillation or irregular heartbeat  Vascular: No history of rest pain in feet.  No history of claudication.  No history of non-healing ulcer, No history of DVT   Pulmonary: No home oxygen, no productive cough, no hemoptysis,  No asthma or wheezing  Musculoskeletal:  [ ]  Arthritis, [ ]  Low back pain,  [ ]  Joint pain  Hematologic:No history of hypercoagulable state.  No history of easy bleeding.  No history of anemia  Gastrointestinal: No hematochezia or melena,  No gastroesophageal reflux, no trouble swallowing  Urinary: [ ]  chronic Kidney disease, [ ]  on HD - [ ]  MWF or [ ]  TTHS, [ ]  Burning with urination, [ ]  Frequent urination, [ ]  Difficulty urinating;   Skin: No rashes  Psychological: No history of anxiety,  No history of depression   Physical Examination  Vitals:   01/29/18 1138 01/29/18 1142  BP: 136/84 (!) 143/82  Pulse: 66 66  Resp: 16   Temp: 98.2 F (36.8 C)   TempSrc: Oral   Weight: 200 lb (90.7 kg)   Height: 5\' 10"  (1.778 m)     Body mass index is 28.7 kg/m.  General:  Alert and oriented, no acute distress HEENT: Normal, normocphalic Neck: No bruit or JVD Pulmonary: Clear to auscultation bilaterally Cardiac: Regular Rate and Rhythm without murmur Gastrointestinal: Soft, non-tender, non-distended Skin: No rash Extremity Pulses:  2+ radial, brachial, femoral, dorsalis pedis, pulses bilaterally Musculoskeletal: No deformity or edema  Neurologic: Upper and lower  extremity motor 5/5 and symmetric  DATA:  Right Carotid Findings: +----------+--------+--------+--------+------------+--------+           PSV cm/sEDV cm/sStenosisDescribe    Comments +----------+--------+--------+--------+------------+--------+ CCA Prox  117     21                                   +----------+--------+--------+--------+------------+--------+ CCA Mid   119     23                                   +----------+--------+--------+--------+------------+--------+  CCA Distal97      19              heterogenous         +----------+--------+--------+--------+------------+--------+ ICA Prox  88      27              heterogenous         +----------+--------+--------+--------+------------+--------+ ICA Mid   85      26                                   +----------+--------+--------+--------+------------+--------+ ICA Distal68      24                                   +----------+--------+--------+--------+------------+--------+ ECA       104     11                                   +----------+--------+--------+--------+------------+--------+  +----------+--------+-------+----------------+-------------------+           PSV cm/sEDV cmsDescribe        Arm Pressure (mmHG) +----------+--------+-------+----------------+-------------------+ [id number]      1      Multiphasic, WNL                    +----------+--------+-------+----------------+-------------------+  +---------+--------+--+--------+--+---------+ VertebralPSV cm/s41EDV cm/s13Antegrade +---------+--------+--+--------+--+---------+    Left Carotid Findings: +----------+--------+--------+--------+--------+--------+           PSV cm/sEDV cm/sStenosisDescribeComments +----------+--------+--------+--------+--------+--------+ CCA Prox  122     24                                +----------+--------+--------+--------+--------+--------+ CCA Mid   122     27                               +----------+--------+--------+--------+--------+--------+ CCA Distal99      22                               +----------+--------+--------+--------+--------+--------+ ICA Prox  99      23                               +----------+--------+--------+--------+--------+--------+ ICA Mid   71      25                               +----------+--------+--------+--------+--------+--------+ ICA Distal72      23                               +----------+--------+--------+--------+--------+--------+ ECA       132     18                               +----------+--------+--------+--------+--------+--------+  +----------+--------+--------+----------------+-------------------+ SubclavianPSV cm/sEDV cm/sDescribe        Arm Pressure (mmHG) +----------+--------+--------+----------------+-------------------+  117     1       Multiphasic, WNL                    +----------+--------+--------+----------------+-------------------+  +---------+--------+--+--------+--+---------+ VertebralPSV cm/s54EDV cm/s12Antegrade +---------+--------+--+--------+--+---------+    Summary: Right Carotid: Velocities in the right ICA are consistent with a 1-39% stenosis.  Left Carotid: Patent carotid endarterectomy site with no evidence for               restenosis. .  ASSESSMENT:  Carotid stenosis s/p left CEA on 04/29/12 by Dr. Donnetta Hutching   PLAN: There is no evidence of restenosis in the left ICA and < 39% in the right. He remains asymptomatic.  He will be scheduled for repeat carotid stenosis surveillance in 2 years.     Roxy Horseman PA-C [facility] Office: [phone]  MD in clinic:Early

## 2018-03-15 ENCOUNTER — Encounter (HOSPITAL_COMMUNITY): Payer: Self-pay | Admitting: Emergency Medicine

## 2018-03-15 ENCOUNTER — Telehealth: Payer: Self-pay | Admitting: Cardiology

## 2018-03-15 ENCOUNTER — Emergency Department (HOSPITAL_COMMUNITY)
Admission: EM | Admit: 2018-03-15 | Discharge: 2018-03-15 | Disposition: A | Discharge status: Home / Self Care | Payer: 59 | Attending: Emergency Medicine | Admitting: Emergency Medicine

## 2018-03-15 ENCOUNTER — Other Ambulatory Visit: Payer: Self-pay

## 2018-03-15 ENCOUNTER — Emergency Department (HOSPITAL_COMMUNITY): Payer: 59

## 2018-03-15 ENCOUNTER — Telehealth: Payer: Self-pay | Admitting: Family Medicine

## 2018-03-15 DIAGNOSIS — Z79899 Other long term (current) drug therapy: Secondary | ICD-10-CM | POA: Diagnosis not present

## 2018-03-15 DIAGNOSIS — I25119 Atherosclerotic heart disease of native coronary artery with unspecified angina pectoris: Secondary | ICD-10-CM | POA: Diagnosis not present

## 2018-03-15 DIAGNOSIS — Z87891 Personal history of nicotine dependence: Secondary | ICD-10-CM | POA: Insufficient documentation

## 2018-03-15 DIAGNOSIS — R0789 Other chest pain: Secondary | ICD-10-CM

## 2018-03-15 DIAGNOSIS — R079 Chest pain, unspecified: Secondary | ICD-10-CM | POA: Diagnosis not present

## 2018-03-15 DIAGNOSIS — Z955 Presence of coronary angioplasty implant and graft: Secondary | ICD-10-CM | POA: Diagnosis not present

## 2018-03-15 DIAGNOSIS — Z7982 Long term (current) use of aspirin: Secondary | ICD-10-CM | POA: Insufficient documentation

## 2018-03-15 DIAGNOSIS — I10 Essential (primary) hypertension: Secondary | ICD-10-CM | POA: Diagnosis not present

## 2018-03-15 LAB — CBC
HCT: 40.3 % (ref 39.0–52.0)
HEMOGLOBIN: 13.8 g/dL (ref 13.0–17.0)
MCH: 31.8 pg (ref 26.0–34.0)
MCHC: 34.2 g/dL (ref 30.0–36.0)
MCV: 92.9 fL (ref 80.0–100.0)
NRBC: 0 % (ref 0.0–0.2)
Platelets: 163 10*3/uL (ref 150–400)
RBC: 4.34 MIL/uL (ref 4.22–5.81)
RDW: 12.3 % (ref 11.5–15.5)
WBC: 5.7 10*3/uL (ref 4.0–10.5)

## 2018-03-15 LAB — BASIC METABOLIC PANEL
ANION GAP: 9 (ref 5–15)
BUN: 16 mg/dL (ref 8–23)
CO2: 27 mmol/L (ref 22–32)
Calcium: 9.1 mg/dL (ref 8.9–10.3)
Chloride: 103 mmol/L (ref 98–111)
Creatinine, Ser: 1.01 mg/dL (ref 0.61–1.24)
GFR calc Af Amer: >60 mL/min (ref >60)
GLUCOSE: 149 mg/dL — AB (ref 70–99)
POTASSIUM: 3.3 mmol/L — AB (ref 3.5–5.1)
Sodium: 139 mmol/L (ref 135–145)

## 2018-03-15 LAB — TROPONIN I: Troponin I: <0.03 ng/mL (ref <0.03)

## 2018-03-15 MED ORDER — SODIUM CHLORIDE 0.9% FLUSH
3.0000 mL | Freq: Once | INTRAVENOUS | Status: AC
  Administered 2018-03-15: 3 mL via INTRAVENOUS

## 2018-03-15 MED ORDER — ASPIRIN 81 MG PO CHEW
324.0000 mg | CHEWABLE_TABLET | Freq: Once | ORAL | Status: AC
Start: 2018-03-15 — End: 2018-03-15
  Administered 2018-03-15: 324 mg via ORAL
  Filled 2018-03-15: qty 4

## 2018-03-15 MED ORDER — POTASSIUM CHLORIDE CRYS ER 20 MEQ PO TBCR
40.0000 meq | EXTENDED_RELEASE_TABLET | Freq: Once | ORAL | Status: AC
  Administered 2018-03-15: 40 meq via ORAL
  Filled 2018-03-15: qty 2

## 2018-03-15 NOTE — Telephone Encounter (Signed)
Pt contacted office to get an appt regarding chest pain. Pt states he did call his cardiologist office but they are unable to get him in. Pt advised that we were also booked. Pt states that his chest pain started a few days ago in the center of his chest and it has not went away or got better. Pt states the pain in not radiating. Pt was advised that if we have a cancellation we may be able to get him in. Pt also advised that he may wanna go to ED to get checked out. Pt states that he may go ahead and do that. Pt verbalized understanding.

## 2018-03-15 NOTE — ED Provider Notes (Signed)
[facility] EMERGENCY DEPARTMENT Provider Note   CSN: [id number] Arrival date & time: 03/15/18  1618    History   Chief Complaint Chief Complaint  Patient presents with   Chest Pain    HPI Michael [name] is a 63 y.o. male.     HPI  Pt was seen at 1705. Per pt, c/o gradual onset and persistence of constant mid-sternal chest "pain" that began 2 days ago. Describes the pain as "like after they opened my chest for heart surgery" but "not that bad." Describes this as a "dull" constant "ache" for the past 2 days. Pain worsens with movement. Pain does not worsen with activity. Pt states he has been walking and climbing stairs at work without change in his discomfort. Denies any associated symptoms. Denies palpitations, no SOB/cough, no abd pain, no N/V/D, no back pain, no rash, no fevers, no injury.    Past Medical History:  Diagnosis Date   Allergy    Anginal pain ([place])    last time a yr ago   Back pain    Bruises easily    CAD (coronary artery disease)    Coronary atherosclerosis of artery bypass graft    Essential hypertension, benign    Takes Amlodipine and Losartan-HCTZ daily;takes Coreg daily   GERD (gastroesophageal reflux disease)    was on Prilosec;is not currently on any meds   Gout    takes Allopurinol daily   Malignant hyperthermia    Potential but not Confirmed   Neck pain    yrs ago and saw a chiropractor occasionally   Other and unspecified hyperlipidemia    takes Simvastatin daily   Peripheral vascular disease ([place])    Urinary urgency     Patient Active Problem List   Diagnosis Date Noted   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   Allergic reaction 12/08/2014   Prostate hypertrophy 11/15/2014   Angioedema 10/24/2013   S/P carotid endarterectomy 06/03/2013   Aftercare following surgery of the circulatory system, NEC 06/03/2013   Occlusion and stenosis of carotid artery without mention of cerebral infarction 12/03/2012    Allergic rhinitis 10/14/2012   Right facial numbness 03/26/2012   Gout 03/24/2012   Obesity (BMI 30-39.9) 12/06/2011   Coronary artery disease 12/15/2010   S/P coronary artery bypass graft x 4 12/15/2010   Hyperlipidemia 12/15/2008   HYPERTENSION, CONTROLLED 12/15/2008    Past Surgical History:  Procedure Laterality Date   ANKLE FRACTURE SURGERY Left 1981   CARDIAC CATHETERIZATION  2015   CAROTID ENDARTERECTOMY Left 04-29-12   cea   COLONOSCOPY     COLONOSCOPY N/A 11/21/2013   Procedure: COLONOSCOPY;  Surgeon: Danie Binder, MD;  [facility];  Service: Endoscopy;  Laterality: N/A;  1:15 PM   CORONARY ARTERY BYPASS GRAFT  1999   x 4   CYSTOSCOPY     ENDARTERECTOMY Left 04/29/2012   Procedure: ENDARTERECTOMY CAROTID;  Surgeon: Rosetta Posner, MD;  Location: [facility];  Service: Vascular;  Laterality: Left;  with primary closure of artery   ESOPHAGOGASTRODUODENOSCOPY     FRACTURE SURGERY  1980   screws placed and in 1981 screws removed(this is when was told about potential for [place] Bilateral Aug. 2005   LEFT HEART CATHETERIZATION WITH CORONARY/GRAFT ANGIOGRAM N/A 06/10/2013   Procedure: LEFT HEART CATHETERIZATION WITH Beatrix Fetters;  Surgeon: Peter M Martinique, MD;  Location: [facility] CATH LAB;  Service: Cardiovascular;  Laterality: N/A;        Home Medications  Prior to Admission medications   Medication Sig Start Date End Date Taking? Authorizing Provider  allopurinol (ZYLOPRIM) 300 MG tablet Take 1 tablet (300 mg total) by mouth daily. 10/10/17   Daune Perch, NP  amoxicillin (AMOXIL) 500 MG tablet Take 1 tablet (500 mg total) by mouth 3 (three) times daily. 01/01/18   Kathyrn Drown, MD  aspirin EC 81 MG tablet Take 1 tablet (81 mg total) by mouth daily. 07/21/16   Dorothy Spark, MD  carvedilol (COREG) 25 MG tablet TAKE 1 TABLET TWICE A DAY WITH MEALS 10/10/17   Daune Perch, NP  cetirizine (ZYRTEC) 10 MG tablet  Take 10 mg by mouth daily.    [provider]  EPINEPHrine 0.3 mg/0.3 mL IJ SOAJ injection Use as directed for life-threatening allergic reaction. 06/08/15   Kozlow, Donnamarie Poag, MD  famotidine (PEPCID) 10 MG tablet Take 10 mg by mouth 2 (two) times daily.    [provider]  penicillin v potassium (VEETID) 500 MG tablet  01/14/18   [provider]  simvastatin (ZOCOR) 10 MG tablet Take 1 tablet (10 mg total) by mouth at bedtime. 10/10/17   Daune Perch, NP    Family History Family History  Problem Relation Age of Onset   Kidney failure Mother    Ovarian cancer Mother    Arthritis Mother    Kidney disease Mother    Hypertension Mother    Breast cancer Mother    Hypertension Father    CVA Brother 32   Hypertension Brother     Social History Social History   Tobacco Use   Smoking status: Former Smoker    Packs/day: 1.00    Years: 20.00    Pack years: 20.00    Types: Cigarettes    Last attempt to quit: 05/14/1996    Years since quitting: 21.8   Smokeless tobacco: Never Used   Tobacco comment: quit 1995  Substance Use Topics   Alcohol use: Yes    Alcohol/week: 6.0 standard drinks    Types: 6 Cans of beer per week   Drug use: No     Allergies   Anesthetics, amide; Ramipril; and Losartan   Review of Systems Review of Systems ROS: Statement: All systems negative except as marked or noted in the HPI; Constitutional: Negative for fever and chills. ; ; Eyes: Negative for eye pain, redness and discharge. ; ; ENMT: Negative for ear pain, hoarseness, nasal congestion, sinus pressure and sore throat. ; ; Cardiovascular: Negative for palpitations, diaphoresis, dyspnea and peripheral edema. ; ; Respiratory: Negative for cough, wheezing and stridor. ; ; Gastrointestinal: Negative for nausea, vomiting, diarrhea, abdominal pain, blood in stool, hematemesis, jaundice and rectal bleeding. . ; ; Genitourinary: Negative for dysuria, flank pain and hematuria.  ; ; Musculoskeletal: +chest wall pain. Negative for back pain and neck pain. Negative for swelling and trauma.; ; Skin: Negative for pruritus, rash, abrasions, blisters, bruising and skin lesion.; ; Neuro: Negative for headache, lightheadedness and neck stiffness. Negative for weakness, altered level of consciousness, altered mental status, extremity weakness, paresthesias, involuntary movement, seizure and syncope.       Physical Exam Updated Vital Signs BP (!) 142/83 (BP Location: Right Arm)    Pulse 79    Temp 97.7 F (36.5 C) (Oral)    Resp 18    Ht 5\' 10"  (1.778 m)    Wt 88.5 kg    SpO2 99%    BMI 27.98 kg/m   Physical Exam 1710: Physical examination:  Nursing notes reviewed; Vital signs and O2 SAT reviewed;  Constitutional: Well developed, Well nourished, Well hydrated, In no acute distress; Head:  Normocephalic, atraumatic; Eyes: EOMI, PERRL, No scleral icterus; ENMT: Mouth and pharynx normal, Mucous membranes moist; Neck: Supple, Full range of motion, No lymphadenopathy; Cardiovascular: Regular rate and rhythm, No gallop; Respiratory: Breath sounds clear & equal bilaterally, No wheezes.  Speaking full sentences with ease, Normal respiratory effort/excursion; Chest: +lower mid-sternal area tender to palp. Movement normal; Abdomen: Soft, Nontender, Nondistended, Normal bowel sounds; Genitourinary: No CVA tenderness; Extremities: Peripheral pulses normal, No tenderness, No edema, No calf edema or asymmetry.; Neuro: AA&Ox3, Major CN grossly intact.  Speech clear. No gross focal motor or sensory deficits in extremities.; Skin: Color normal, Warm, Dry.    ED Treatments / Results  Labs (all labs ordered are listed, but only abnormal results are displayed)   EKG EKG Interpretation  Date/Time:  Friday March 15 2018 16:26:33 EDT Ventricular Rate:  76 PR Interval:  184 QRS Duration: 90 QT Interval:  372 QTC Calculation: 418 R Axis:   40 Text Interpretation:  Normal sinus rhythm T wave  abnormality, consider lateral ischemia When compared with ECG of 02/12/2015, 04/02/2015 No significant change was found Confirmed by Francine Graven [phone]) on 03/15/2018 6:31:15 PM   Radiology   Procedures Procedures (including critical care time)  Medications Ordered in ED Medications  sodium chloride flush (NS) 0.9 % injection 3 mL (has no administration in time range)  aspirin chewable tablet 324 mg (324 mg Oral Given 03/15/18 1738)  potassium chloride SA (K-DUR,KLOR-CON) CR tablet 40 mEq (40 mEq Oral Given 03/15/18 1800)     Initial Impression / Assessment and Plan / ED Course  I have reviewed the triage vital signs and the nursing notes.  Pertinent labs & imaging results that were available during my care of the patient were reviewed by me and considered in my medical decision making (see chart for details).     MDM Reviewed: previous chart, nursing note and vitals Reviewed previous: labs and ECG Interpretation: labs, ECG and x-ray    Results for orders placed or performed during the hospital encounter of 76/28/31  Basic metabolic panel  Result Value Ref Range   Sodium 139 135 - 145 mmol/L   Potassium 3.3 (L) 3.5 - 5.1 mmol/L   Chloride 103 98 - 111 mmol/L   CO2 27 22 - 32 mmol/L   Glucose, Bld 149 (H) 70 - 99 mg/dL   BUN 16 8 - 23 mg/dL   Creatinine, Ser 1.01 0.61 - 1.24 mg/dL   Calcium 9.1 8.9 - 10.3 mg/dL   GFR calc non Af Amer >60 >60 mL/min   GFR calc Af Amer >60 >60 mL/min   Anion gap 9 5 - 15  CBC  Result Value Ref Range   WBC 5.7 4.0 - 10.5 K/uL   RBC 4.34 4.22 - 5.81 MIL/uL   Hemoglobin 13.8 13.0 - 17.0 g/dL   HCT 40.3 39.0 - 52.0 %   MCV 92.9 80.0 - 100.0 fL   MCH 31.8 26.0 - 34.0 pg   MCHC 34.2 30.0 - 36.0 g/dL   RDW 12.3 11.5 - 15.5 %   Platelets 163 150 - 400 K/uL   nRBC 0.0 0.0 - 0.2 %  Troponin I - ONCE - STAT  Result Value Ref Range   Troponin I <0.03 <0.03 ng/mL  Troponin I - Once  Result Value Ref Range   Troponin I <0.03 <0.03 ng/mL  Dg Chest 2 View Result Date: 03/15/2018 CLINICAL DATA:  Chest pain EXAM: CHEST - 2 VIEW COMPARISON:  April 19, 2012 FINDINGS: There is no appreciable edema or consolidation. The heart size and pulmonary vascularity are normal. Patient is status post coronary artery bypass grafting. No adenopathy evident. There is degenerative change in each shoulder. There are surgical clips in the lower neck region. IMPRESSION: No edema or consolidation. Heart size within normal limits. Areas of postoperative change. Electronically Signed   By: Lowella Grip III M.D.   On: 03/15/2018 17:23    1945:   Doubt PE as cause for symptoms with low risk [place].  Doubt ACS as cause for symptoms with normal troponin x2 and unchanged EKG from previous after 2 days of constant symptoms. Tx symptomatically, f/u Cards MD. T/C returned from [place] Dr. Meda Coffee, case discussed, including:  HPI, pertinent PM/SHx, VS/PE, dx testing, ED course and treatment:  Agrees no indication for admission at this time, requests to have pt keep his already scheduled appointment on Monday at [place] with her. Dx and testing, as well as d/w Cards MD, d/w pt.  Questions answered.  Verb understanding, agreeable to d/c home with outpt f/u on Monday.      Final Clinical Impressions(s) / ED Diagnoses   Final diagnoses:  None    ED Discharge Orders    None       Francine Graven, DO 03/18/18 2117

## 2018-03-15 NOTE — Telephone Encounter (Signed)
Call pt back with his hx of cad I agree definitely needs to go right to the er; let pt know i'd rec that strongly

## 2018-03-15 NOTE — Telephone Encounter (Signed)
Pt called to report that he has had a sharp pain in the center of his chest since Wed 03/13/18... he says it is constant and not related to exertion.. he does say that it seems to worsen when he twists or turns his body or bends over... he denies any strenuous work or a cold that made him cough ... nothing that could cause muscle strain...   After taking with the DOD.. Dr. Angelena Form... pt to be seen Monday 03/18/18 with Dr. Meda Coffee and if his symptoms change or worsen over the weekend that he could go to the ER... Pt also to call Dr. Wolfgang Phoenix his PMD to be seen to be evaluated for non-cardiac problems. Pt verbalized understanding and agreed.

## 2018-03-15 NOTE — Telephone Encounter (Signed)
  Pt c/o of Chest Pain: STAT if CP now or developed within 24 hours  1. Are you having CP right now? Yes, but its tolerable  2. Are you experiencing any other symptoms (ex. SOB, nausea, vomiting, sweating)? no  3. How long have you been experiencing CP? Since Wednesday  4. Is your CP continuous or coming and going? Constant pain in the center of chest  5. Have you taken Nitroglycerin? Doesn't currently have any ?

## 2018-03-15 NOTE — Discharge Instructions (Signed)
Take over the counter tylenol and/or ibuprofen, as directed on packaging, as needed for discomfort. the prescriptions as directed.  Apply moist heat or ice to the area(s) of discomfort, for 15 minutes at a time, several times per day for the next few days.  Do not fall asleep on a heating or ice pack.  Keep your Cardiologist appointment on Monday at 10am to be seen in follow up.  Return to the Emergency Department immediately if worsening.

## 2018-03-15 NOTE — ED Triage Notes (Signed)
Reports pain is worse with movement   Has had pain since WEds

## 2018-03-15 NOTE — Telephone Encounter (Signed)
Pt contacted and informed that provider states to go to ER right away. Pt verbalized understanding. Pt has appt at cardiology for 10 am on Monday.

## 2018-03-15 NOTE — ED Triage Notes (Signed)
Med sternal CP  Card Dr Meda Coffee  Has appt with PCP in the morning

## 2018-03-18 ENCOUNTER — Encounter: Payer: Self-pay | Admitting: Cardiology

## 2018-03-18 ENCOUNTER — Ambulatory Visit: Payer: 59 | Admitting: Physician Assistant

## 2018-03-18 ENCOUNTER — Other Ambulatory Visit: Payer: Self-pay

## 2018-03-18 ENCOUNTER — Ambulatory Visit (INDEPENDENT_AMBULATORY_CARE_PROVIDER_SITE_OTHER): Payer: 59 | Admitting: Cardiology

## 2018-03-18 ENCOUNTER — Telehealth (HOSPITAL_COMMUNITY): Payer: Self-pay | Admitting: *Deleted

## 2018-03-18 VITALS — BP 130/68 | HR 70 | Ht 70.0 in | Wt 209.2 lb

## 2018-03-18 DIAGNOSIS — R079 Chest pain, unspecified: Secondary | ICD-10-CM | POA: Diagnosis not present

## 2018-03-18 DIAGNOSIS — Z951 Presence of aortocoronary bypass graft: Secondary | ICD-10-CM | POA: Diagnosis not present

## 2018-03-18 NOTE — Patient Instructions (Addendum)
Medication Instructions:  Your provider recommends that you continue on your current medications as directed. Please refer to the Current Medication list given to you today.   If you need a refill on your cardiac medications before your next appointment, please call your pharmacy.    Testing/Procedures: Your physician has requested that you have en exercise stress myoview. For further information please visit [email or web address]. Please follow instruction sheet, as given.  Follow-Up: Dr. Francesca Oman nurse will call you to arrange an appointment in 2 months.

## 2018-03-18 NOTE — Telephone Encounter (Signed)
Left message on voicemail per DPR in reference to upcoming appointment scheduled on 03/19/18 at 7:45 with detailed instructions given per Myocardial Perfusion Study Information Sheet for the test. LM to arrive 15 minutes early, and that it is imperative to arrive on time for appointment to keep from having the test rescheduled. If you need to cancel or reschedule your appointment, please call the office within 24 hours of your appointment. Failure to do so may result in a cancellation of your appointment, and a $50 no show fee. Phone number given for call back for any questions.

## 2018-03-18 NOTE — Progress Notes (Signed)
Cardiology Office Note:    Date:  10/08/2017   ID:  Michael [name], DOB November 09, 1955, MRN [id number]  PCP:  Michael Kirschner, MD   Cardiologist:  Michael Dawley, MD  Referring MD: Michael Kirschner, MD   Chief Complaint: annual follow up for CAD  History of Present Illness:    Michael [name] is a 63 y.o. male with a past medical history significant for CAD, CABG I n 1999, PAD, s/p left carotid endarterectomy in April 2014, hypertension and hyperlipidemia.   At his last visit in 07/2016 it was noted that patient had lost over 30 pounds.  He was also found to be allergic to beef and pork. He takes Cetirizine which keeps his allergy in check. He still eats beef and pork.   He is here for his one-year follow-up. He still works in Systems developer. He walks a lot on the job and walks outside or uses stationary bike at home. No chest pain/pressure/tightness or shortness of breath with exertion. No palpitations, lightheadedness, syncope, orthopnea, PND or edema. He feels well and is mainly here for med refill.   03/18/2018 -the patient is coming after ER visit on Friday, he has developed chest pain last Wednesday, he first noticed it at rest, but persistent ever since, midsternal worse when laying on his left side in bed and with deep inspiration.  He denies any recent fever or chills this pain is worse with motion but not necessarily with exertion.  He does not remember any recent heavy lifting or injury.  He has been compliant with his medications and has no side effects.  He presented to the ER in [place] on Friday and his troponin was negative x2, EKG was unchanged and his blood work was otherwise normal.      Past Medical History:  Diagnosis Date   Allergy    Anginal pain ([place])    last time a yr ago   Back pain    Bruises easily    CAD (coronary artery disease)    Coronary atherosclerosis of artery bypass graft    Essential hypertension, benign     Takes Amlodipine and Losartan-HCTZ daily;takes Coreg daily   GERD (gastroesophageal reflux disease)    was on Prilosec;is not currently on any meds   Gout    takes Allopurinol daily   Malignant hyperthermia    Potential but not Confirmed   Neck pain    yrs ago and saw a chiropractor occasionally   Other and unspecified hyperlipidemia    takes Simvastatin daily   Peripheral vascular disease ([place])    Urinary urgency          Past Surgical History:  Procedure Laterality Date   ANKLE FRACTURE SURGERY Left 1981   CARDIAC CATHETERIZATION  2015   CAROTID ENDARTERECTOMY Left 04-29-12   cea   COLONOSCOPY     COLONOSCOPY N/A 11/21/2013   Procedure: COLONOSCOPY;  Surgeon: Danie Binder, MD;  [facility];  Service: Endoscopy;  Laterality: N/A;  1:15 PM   CORONARY ARTERY BYPASS GRAFT  1999   x 4   CYSTOSCOPY     ENDARTERECTOMY Left 04/29/2012   Procedure: ENDARTERECTOMY CAROTID;  Surgeon: Rosetta Posner, MD;  Location: [place];  Service: Vascular;  Laterality: Left;  with primary closure of artery   ESOPHAGOGASTRODUODENOSCOPY     FRACTURE SURGERY  1980   screws placed and in 1981 screws removed(this is when was told about potential for [facility]  INGUINAL HERNIA REPAIR Bilateral Aug. 2005   LEFT HEART CATHETERIZATION WITH CORONARY/GRAFT ANGIOGRAM N/A 06/10/2013   Procedure: LEFT HEART CATHETERIZATION WITH Beatrix Fetters;  Surgeon: Peter M Martinique, MD;  Location[facility] C[facility] LAB;  Service: Cardiovascular;  Laterality: N/A;    [name] Medications:    Allergies as of 03/18/2018      Reactions   Anesthetics, Amide Anaphylaxis   Ramipril Cough   Losartan       Medication List       Accurate as of March 18, 2018 10:21 AM. Always use your most recent med list.        allopurinol 300 MG tablet Commonly known as:  ZYLOPRIM Take 1 tablet (300 mg total) by mouth daily.   amoxicillin 500 MG tablet Commonly known as:  AMOXIL Take  1 tablet (500 mg total) by mouth 3 (three) times daily.   aspirin EC 81 MG tablet Take 1 tablet (81 mg total) by mouth daily.   carvedilol 25 MG tablet Commonly known as:  COREG TAKE 1 TABLET TWICE A DAY WITH MEALS   cetirizine 10 MG tablet Commonly known as:  ZYRTEC Take 10 mg by mouth daily.   EPINEPHrine 0.3 mg/0.3 mL Soaj injection Commonly known as:  EPI-PEN Use as directed for life-threatening allergic reaction.   famotidine 10 MG tablet Commonly known as:  PEPCID Take 10 mg by mouth 2 (two) times daily.   simvastatin 10 MG tablet Commonly known as:  ZOCOR Take 1 tablet (10 mg total) by mouth at bedtime.       Allergies:   Anesthetics, amide; Ramipril; and Losartan   Social History        Socioeconomic History   Marital status: Divorced    Spouse name: Not on file   Number of children: Not on file   Years of education: Not on file   Highest education level: Not on file  Occupational History   Occupation: CONSTRUCTION/MAINT    Employer: [employer]    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 gamble  Social Needs   Financial resource strain: Not on file   Food insecurity:    Worry: Not on file    Inability: Not on file   Transportation needs:    Medical: Not on file    Non-medical: Not on file  Tobacco Use   Smoking status: Former Smoker    Packs/day: 1.00    Years: 20.00    Pack years: 20.00    Types: Cigarettes    Last attempt to quit: 05/14/1996    Years since quitting: 21.4   Smokeless tobacco: Never Used   Tobacco comment: quit 1995  Substance and Sexual Activity   Alcohol use: Yes    Alcohol/week: 6.0 standard drinks    Types: 6 Cans of beer per week   Drug use: No   Sexual activity: Never  Lifestyle   Physical activity:    Days per week: Not on file    Minutes per session: Not on file   Stress: Not on file  Relationships   Social connections:    Talks on phone: Not on file    Gets together:  Not on file    Attends religious service: Not on file    Active member of club or organization: Not on file    Attends meetings of clubs or organizations: Not on file    Relationship status: Not on file  Other Topics Concern   Not on file  Social History Narrative   Not  on file     Family History: The patient's family history includes Arthritis in his mother; Breast cancer in his mother; CVA (age of onset: 64) in his brother; Hypertension in his brother, father, and mother; Kidney disease in his mother; Kidney failure in his mother; Ovarian cancer in his mother. ROS:   Please see the history of present illness.     All other systems reviewed and are negative.  EKGs/Labs/Other Studies Reviewed:    The following studies were reviewed today:  Echocardiogram 01/04/2012 Study Conclusions  - Left ventricle: The cavity size was normal. Wall thickness was increased in a pattern of mild LVH. Systolic function was normal. The estimated ejection fraction was in the range of 60% to 65%. Wall motion was normal; there were no regional wall motion abnormalities. Left ventricular diastolic function parameters were normal.  ECG- normal sinus rhythm, 83 beats per minute, nonspecific T wave abnormality.  Exercise nuclear stress test 12/12/2013 Exercise Capacity: Good exercise capacity. BP Response: Baseline HTN with Hypertensive blood pressure  response. DBP increased from 85 - 117 mmHg; SBP 157 - 193 mmHg Clinical Symptoms: The exercise was limited by dyspnea &  fatigue. No anginal pain.. ECG Impression: Significant ST abnormalities consistent with  ischemia. Comparison with Prior Nuclear Study: No images to compare LV Wall Motion: NL LV Function; NL Wall Motion  Overall Impression: Low to Intermediate risk stress nuclear  study Abnormal ECG portion of the Exercise Myoview with notable  ST depression concerning for ischemia along with PVCs in singlets & couplet that were  more prominent . There is no Scintigraphic  finding to suggest ischemia, but the presence of significant  splanchnic attenuation makes the study less interpretable.  Cardiac cath 06/10/2013 Left ventriculography: Left ventricular systolic function is normal, LVEF is estimated at 55-65%, there is no significant mitral regurgitation   Final Conclusions:  1. Severe 3 vessel obstructive CAD 2. All grafts are patent including SVG to diagonal 2, SVG to OM 2, RIMA to RCA, and LIMA to LAD.  3. Normal LV function.   Recommendations: Recommend continued medical therapy. His grafts are functioning very well. The PDA is occluded but fills by collaterals. The LCx in the AV groove has severe disease but does not supply much myocardium.   Peter M Martinique, MDFACC  EKG:  EKG is  ordered today.  The ekg ordered today demonstrates NSR, 64 bpm, with non-specific lateral T wave flattening  Recent Labs: 12/21/2016: ALT 24; BUN 14; Creatinine, Ser 0.73; Potassium 4.1; Sodium 143   Recent Lipid Panel         Component Value Date/Time   CHOL 176 12/21/2016 1410   TRIG 133 12/21/2016 1410   HDL 58 12/21/2016 1410   CHOLHDL 3.0 12/21/2016 1410   CHOLHDL 4.0 01/05/2015 0855   VLDL 28 01/05/2015 0855   LDLCALC 91 12/21/2016 1410    Physical Exam:    VS:   BP 130/68    Pulse 70    Ht 5\' 10"  (1.778 m)    Wt 209 lb 3.2 oz (94.9 kg)    SpO2 99%    BMI 30.02 kg/m       Wt Readings from Last 3 Encounters:  02/09/17 204 lb (92.5 kg)  01/18/17 204 lb 0.8 oz (92.6 kg)  12/21/16 207 lb (93.9 kg)     Physical Exam  Constitutional: He is oriented to person, place, and time. He appears well-developed and well-nourished. No distress.  HENT:  Head: Normocephalic and atraumatic.  Neck: Normal range of motion. Neck supple. No JVD present.  Cardiovascular: Normal rate, regular rhythm, normal heart sounds and intact distal pulses. Exam reveals no gallop and no friction rub.  No murmur  heard. Pulmonary/Chest: Effort normal and breath sounds normal.  Abdominal: Soft. Bowel sounds are normal.  Musculoskeletal: Normal range of motion.        General: No deformity or edema.  Neurological: He is alert and oriented to person, place, and time.  Skin: Skin is warm and dry.  Psychiatric: He has a normal mood and affect. His behavior is normal. Judgment and thought content normal.  Vitals reviewed.   ASSESSMENT:    1. Coronary artery disease due to lipid rich plaque   2. HYPERTENSION, CONTROLLED   3. S/P carotid endarterectomy   4. Other hyperlipidemia    PLAN:    In order of problems listed above:  CAD S/P CABG 1999:  He has new symptoms that seem to be atypical and possibly related to acute pericarditis, his EKG is unchanged with sinus rhythm and negative T waves in the lateral leads however this was present already on EKG in 2017 and 2019.  We will obtain nuclear stress test and if negative we will start ibuprofen 400 mg p.o. twice daily for 5 days and colchicine 0.6 mg p.o. twice daily for 3 months.    Hypertension: Avoiding ACE/ARB due to allergy. BP well controlled on carvedilol.   Carotid artery disease: On aspirin and statin. Last carotid dopplers in 07/2016 with patent left carotid endarterectomy and minimal stenosis in the right in January 2020. No carotid bruits.   Hyperlipidemia: LDL 91 in 12/2016. Goal LDL <70. Pt does not want to increase statin to achieve goal. He works on diet and exercise.   Follow-up in 2 months.  Medication Adjustments/Labs and Tests Ordered: [name] medicines are reviewed at length with the patient today.  Concerns regarding medicines are outlined above. Labs and tests ordered and medication changes are outlined in the patient instructions below:  There are no Patient Instructions on file for this visit.   Signed, Michael Dawley, MD 10/08/2017 5:17 AM    [place]

## 2018-03-19 ENCOUNTER — Telehealth: Payer: Self-pay | Admitting: Cardiology

## 2018-03-19 ENCOUNTER — Ambulatory Visit (HOSPITAL_COMMUNITY): Payer: 59 | Attending: Cardiovascular Disease

## 2018-03-19 DIAGNOSIS — R079 Chest pain, unspecified: Secondary | ICD-10-CM | POA: Insufficient documentation

## 2018-03-19 DIAGNOSIS — Z951 Presence of aortocoronary bypass graft: Secondary | ICD-10-CM | POA: Diagnosis not present

## 2018-03-19 LAB — MYOCARDIAL PERFUSION IMAGING
Estimated workload: 10.1 METS
Exercise duration (min): 9 min
LV dias vol: 78 mL (ref 62–150)
LV sys vol: 28 mL
MPHR: 157 {beats}/min
Peak HR: 139 {beats}/min
Percent HR: 88 %
RPE: 19
Rest HR: 65 {beats}/min
SDS: 2
SRS: 0
SSS: 2
TID: 0.95

## 2018-03-19 MED ORDER — TECHNETIUM TC 99M TETROFOSMIN IV KIT
32.7000 | PACK | Freq: Once | INTRAVENOUS | Status: AC | PRN
  Administered 2018-03-19: 32.7 via INTRAVENOUS
  Filled 2018-03-19: qty 33

## 2018-03-19 MED ORDER — TECHNETIUM TC 99M TETROFOSMIN IV KIT
10.1000 | PACK | Freq: Once | INTRAVENOUS | Status: AC | PRN
  Administered 2018-03-19: 10.1 via INTRAVENOUS
  Filled 2018-03-19: qty 11

## 2018-03-19 NOTE — Telephone Encounter (Signed)
The patient has been notified of the result and verbalized understanding.  All questions (if any) were answered. Pt thanked me for the call and the good news.  Julaine Hua, [facility] 03/19/2018 4:44 PM

## 2018-03-19 NOTE — Telephone Encounter (Signed)
Patient returned call for test results.  °

## 2018-03-19 NOTE — Telephone Encounter (Signed)
-----   Message from Nuala Alpha, LPN sent at 2/42/6834  1:33 PM EDT -----  ----- Message ----- From: Dorothy Spark, MD Sent: 03/19/2018   1:30 PM EDT To: Nuala Alpha, LPN  Normal stress test, normal LVEF.

## 2018-09-08 ENCOUNTER — Other Ambulatory Visit: Payer: Self-pay | Admitting: Cardiology

## 2018-09-08 DIAGNOSIS — E782 Mixed hyperlipidemia: Secondary | ICD-10-CM

## 2018-09-08 DIAGNOSIS — I10 Essential (primary) hypertension: Secondary | ICD-10-CM

## 2018-10-21 ENCOUNTER — Encounter: Payer: Self-pay | Admitting: Gastroenterology

## 2018-10-30 ENCOUNTER — Encounter: Payer: Self-pay | Admitting: Physician Assistant

## 2018-10-30 NOTE — Progress Notes (Signed)
Cardiology Office Note    Date:  11/01/2018   ID:  Michael [name] Feb 06, 1955, MRN [id number]  PCP:  Mikey Kirschner, MD  Cardiologist:  Ena Dawley, MD  Electrophysiologist:  None   Chief Complaint: f/u CAD  History of Present Illness:   Michael [name] is a 63 y.o. male with history of CAD s/p CABG 1999, carotid artery disease s/p left carotid endarterectomy 2014 (followed by [facility], last duplex 01/2018), gout, hypertension, hyperlipidemia, GERD, beef/pork allergy (alpha gal) who presents back for routine follow-up. Last echo in 2014 showed mild LVH, EF 60-65%, no RWMA. Last cath in 2015 showed patent grafts an normal LV function. He was seen in the ED 03/2018 with chest wall pain and ruled out. Nuclear stress test 03/2018 showed 0.5 - 1.0 mm ST depression in the inferior and lateral leads but no evidence of ischemia on the scintographic images, felt to be low risk per Dr. Francesca Oman review. Last labs 03/2018 normal CBC, K 3.3, Cr 1.01, glucose 149, 10/2016 LDL 92, trig 133, HDL 58.  He is seen back for routine follow-up today overall feeling great. No recent anginal symptoms, CP, SOB, LEE, orthopnea, PND, palpitations reported. Blood pressure is moderately elevated. He does not routinely check at home but had in the past and it was labile from 120s-150s by wrist cuff. At one point he was on amlodipine 10mg  daily but it never got refilled so he stopped taking it. He has an active job. He does not recall prior issues with some of the other guideline directed statins, but does report h/o memory issues. ACEI/ARB previously both caused dry cough.   Past Medical History:  Diagnosis Date   Allergy    Back pain    Bruises easily    CAD (coronary artery disease)    a. s/p CABG 1999. b. Cath 2015 patent grafts.   Carotid artery disease ([place])    a. s/p L CEA 2014, followed by [facility].   Essential hypertension    GERD (gastroesophageal reflux disease)    was on Prilosec;is not currently on  any meds   Gout    takes Allopurinol daily   Hyperglycemia    Hyperlipidemia    Malignant hyperthermia    Potential but not Confirmed   Neck pain    yrs ago and saw a chiropractor occasionally   Urinary urgency     Past Surgical History:  Procedure Laterality Date   ANKLE FRACTURE SURGERY Left 1981   CARDIAC CATHETERIZATION  2015   CAROTID ENDARTERECTOMY Left 04-29-12   cea   COLONOSCOPY     COLONOSCOPY N/A 11/21/2013   Procedure: COLONOSCOPY;  Surgeon: Danie Binder, MD;  [facility];  Service: Endoscopy;  Laterality: N/A;  1:15 PM   CORONARY ARTERY BYPASS GRAFT  1999   x 4   CYSTOSCOPY     ENDARTERECTOMY Left 04/29/2012   Procedure: ENDARTERECTOMY CAROTID;  Surgeon: Rosetta Posner, MD;  Location: [place];  Service: Vascular;  Laterality: Left;  with primary closure of artery   ESOPHAGOGASTRODUODENOSCOPY     FRACTURE SURGERY  1980   screws placed and in 1981 screws removed(this is when was told about potential for [place] Bilateral Aug. 2005   LEFT HEART CATHETERIZATION WITH CORONARY/GRAFT ANGIOGRAM N/A 06/10/2013   Procedure: LEFT HEART CATHETERIZATION WITH Beatrix Fetters;  Surgeon: Peter M Martinique, MD;  Location: [facility] CATH LAB;  Service: Cardiovascular;  Laterality: N/A;    Current  Medications: Current Meds  Medication Sig   aspirin EC 81 MG tablet Take 1 tablet (81 mg total) by mouth daily.   carvedilol (COREG) 25 MG tablet TAKE 1 TABLET TWICE A DAY WITH MEALS   cetirizine (ZYRTEC) 10 MG tablet Take 10 mg by mouth daily.   EPINEPHrine 0.3 mg/0.3 mL IJ SOAJ injection Use as directed for life-threatening allergic reaction.   famotidine (PEPCID) 10 MG tablet Take 10 mg by mouth 2 (two) times daily.   Multiple Vitamin (MULTIVITAMIN) tablet Take 1 tablet by mouth daily.   simvastatin (ZOCOR) 10 MG tablet Take 1 tablet (10 mg total) by mouth at bedtime.      Allergies:   Anesthetics, amide; Ramipril; and Losartan    Social History   Socioeconomic History   Marital status: Divorced    Spouse name: Not on file   Number of children: Not on file   Years of education: Not on file   Highest education level: Not on file  Occupational History   Occupation: CONSTRUCTION/MAINT    Employer: [employer]    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 gamble  Social Needs   Financial resource strain: Not on file   Food insecurity    Worry: Not on file    Inability: Not on file   Transportation needs    Medical: Not on file    Non-medical: Not on file  Tobacco Use   Smoking status: Former Smoker    Packs/day: 1.00    Years: 20.00    Pack years: 20.00    Types: Cigarettes    Quit date: 05/14/1996    Years since quitting: 22.4   Smokeless tobacco: Never Used   Tobacco comment: quit 1995  Substance and Sexual Activity   Alcohol use: Yes    Alcohol/week: 6.0 standard drinks    Types: 6 Cans of beer per week   Drug use: No   Sexual activity: Never  Lifestyle   Physical activity    Days per week: Not on file    Minutes per session: Not on file   Stress: Not on file  Relationships   Social connections    Talks on phone: Not on file    Gets together: Not on file    Attends religious service: Not on file    Active member of club or organization: Not on file    Attends meetings of clubs or organizations: Not on file    Relationship status: Not on file  Other Topics Concern   Not on file  Social History Narrative   Not on file     Family History:  The patient's family history includes Arthritis in his mother; Breast cancer in his mother; CVA (age of onset: 108) in his brother; Hypertension in his brother, father, and mother; Kidney disease in his mother; Kidney failure in his mother; Ovarian cancer in his mother.  ROS:   Please see the history of present illness.  All other systems are reviewed and otherwise negative.    EKGs/Labs/Other Studies Reviewed:    Studies reviewed were  summarized above.   EKG:  EKG is ordered today, personally reviewed, demonstrating sinus bradycardia 58bpm, nonspecific TW changes in V4-V6. Unchanged from prior. 3  Recent Labs: 03/15/2018: BUN 16; Creatinine, Ser 1.01; Hemoglobin 13.8; Platelets 163; Potassium 3.3; Sodium 139  Recent Lipid Panel    Component Value Date/Time   CHOL 176 12/21/2016 1410   TRIG 133 12/21/2016 1410   HDL 58 12/21/2016 1410   CHOLHDL 3.0  12/21/2016 1410   CHOLHDL 4.0 01/05/2015 0855   VLDL 28 01/05/2015 0855   LDLCALC 91 12/21/2016 1410    PHYSICAL EXAM:    VS:  BP (!) 152/88    Pulse 63    Ht 5\' 10"  (1.778 m)    Wt 207 lb 3.2 oz (94 kg)    SpO2 99%    BMI 29.73 kg/m   BMI: Body mass index is 29.73 kg/m.  GEN: Well nourished, well developed WM, in no acute distress HEENT: normocephalic, atraumatic Neck: no JVD, carotid bruits, or masses Cardiac: RRR; no murmurs, rubs, or gallops, no edema  Respiratory:  clear to auscultation bilaterally, normal work of breathing GI: soft, nontender, nondistended, + BS MS: no deformity or atrophy Skin: warm and dry, no rash Neuro:  Alert and Oriented x 3, Strength and sensation are intact, follows commands Psych: euthymic mood, full affect  Wt Readings from Last 3 Encounters:  11/01/18 207 lb 3.2 oz (94 kg)  03/18/18 209 lb 3.2 oz (94.9 kg)  03/15/18 195 lb (88.5 kg)     ASSESSMENT & PLAN:   1. CAD - doing well without angina. Continue ASA, BB. See below regarding statin. Due for updated labs today. 2. Essential HTN - BP above goal. Will re-add amlodipine in the form of 5mg  daily and have him track BP at home. Will do #30 to local pharmacy for now - if BP is controlled by his report next week, we can then send in routine rx to [facility]. However, he may require further titration to 10mg  daily so will not yet send in the #90 supply until we know more about BP trajectory. Offered VOV for f/u, but he would prefer to just send in readings via MyChart.  Instructions relayed for checking BP on AVS (see attached). Also encouraged use of arm cuff instead of wrist cuff. Goal BP would be <130/80. He is also due for routine labs so will get TSH, CMET, CBC. 3. Hyperlipidemia - he is fasting today. Check CMET/lipid profile today. He is only on low intensity statin dose with suboptimal LDL of 92 in 2018. Goal LDL would be <70 especially given multivessel CAD and carotid disease. It looks like he previously elected to defer dose increase. Will await labs to help guide further recommendations. 4. Electrolyte abnormalities (hyperglycemia, hypokalemia) - recheck labs today - will also get concomitant magnesium level given hypokalemia.  Disposition: F/u with Dr. Meda Coffee in 1 year. He was also encouraged to f/u PCP whom he has not seen recently. He had requested refill on his allopurinol from our office earlier and it was denied. He is at a fairly high maintenance dose. We do not typically manage this medicine. I relayed I would be willing to fill for short term rx to allow time for him to talk to PCP but he declined.  Medication Adjustments/Labs and Tests Ordered: Current medicines are reviewed at length with the patient today.  Concerns regarding medicines are outlined above. Medication changes, Labs and Tests ordered today are summarized above and listed in the Patient Instructions accessible in Encounters.   Signed, Charlie Pitter, PA-C  11/01/2018 10:29 AM    [address], [place], [place]  [address] Phone: [phone]; Fax: [phone]

## 2018-11-01 ENCOUNTER — Other Ambulatory Visit: Payer: Self-pay

## 2018-11-01 ENCOUNTER — Ambulatory Visit (INDEPENDENT_AMBULATORY_CARE_PROVIDER_SITE_OTHER): Payer: 59 | Admitting: Physician Assistant

## 2018-11-01 ENCOUNTER — Encounter: Payer: Self-pay | Admitting: Physician Assistant

## 2018-11-01 VITALS — BP 152/88 | HR 63 | Ht 70.0 in | Wt 207.2 lb

## 2018-11-01 DIAGNOSIS — I10 Essential (primary) hypertension: Secondary | ICD-10-CM

## 2018-11-01 DIAGNOSIS — R739 Hyperglycemia, unspecified: Secondary | ICD-10-CM

## 2018-11-01 DIAGNOSIS — E785 Hyperlipidemia, unspecified: Secondary | ICD-10-CM

## 2018-11-01 DIAGNOSIS — I2510 Atherosclerotic heart disease of native coronary artery without angina pectoris: Secondary | ICD-10-CM

## 2018-11-01 DIAGNOSIS — E876 Hypokalemia: Secondary | ICD-10-CM

## 2018-11-01 LAB — COMPREHENSIVE METABOLIC PANEL
ALT: 23 IU/L (ref 0–44)
AST: 26 IU/L (ref 0–40)
Albumin/Globulin Ratio: 2 (ref 1.2–2.2)
Albumin: 4.5 g/dL (ref 3.8–4.8)
Alkaline Phosphatase: 62 IU/L (ref 39–117)
BUN/Creatinine Ratio: 14 (ref 10–24)
BUN: 13 mg/dL (ref 8–27)
Bilirubin Total: 0.5 mg/dL (ref 0.0–1.2)
CO2: 25 mmol/L (ref 20–29)
Calcium: 9.1 mg/dL (ref 8.6–10.2)
Chloride: 99 mmol/L (ref 96–106)
Creatinine, Ser: 0.92 mg/dL (ref 0.76–1.27)
GFR calc Af Amer: 101 mL/min/{1.73_m2} (ref >59)
GFR calc non Af Amer: 88 mL/min/{1.73_m2} (ref >59)
Globulin, Total: 2.2 g/dL (ref 1.5–4.5)
Glucose: 104 mg/dL — ABNORMAL HIGH (ref 65–99)
Potassium: 4.6 mmol/L (ref 3.5–5.2)
Sodium: 141 mmol/L (ref 134–144)
Total Protein: 6.7 g/dL (ref 6.0–8.5)

## 2018-11-01 LAB — LIPID PANEL
Chol/HDL Ratio: 3.5 ratio (ref 0.0–5.0)
Cholesterol, Total: 166 mg/dL (ref 100–199)
HDL: 48 mg/dL (ref >39)
LDL Chol Calc (NIH): 102 mg/dL — ABNORMAL HIGH (ref 0–99)
Triglycerides: 88 mg/dL (ref 0–149)
VLDL Cholesterol Cal: 16 mg/dL (ref 5–40)

## 2018-11-01 LAB — MAGNESIUM: Magnesium: 1.7 mg/dL (ref 1.6–2.3)

## 2018-11-01 LAB — CBC
Hematocrit: 41 % (ref 37.5–51.0)
Hemoglobin: 14.2 g/dL (ref 13.0–17.7)
MCH: 31.8 pg (ref 26.6–33.0)
MCHC: 34.6 g/dL (ref 31.5–35.7)
MCV: 92 fL (ref 79–97)
Platelets: 173 10*3/uL (ref 150–450)
RBC: 4.47 x10E6/uL (ref 4.14–5.80)
RDW: 12.4 % (ref 11.6–15.4)
WBC: 4.9 10*3/uL (ref 3.4–10.8)

## 2018-11-01 LAB — TSH: TSH: 1.15 u[IU]/mL (ref 0.450–4.500)

## 2018-11-01 MED ORDER — AMLODIPINE BESYLATE 5 MG PO TABS: 5.0000 mg | ORAL_TABLET | Freq: Every day | ORAL | 0 refills | Status: DC

## 2018-11-01 NOTE — Patient Instructions (Addendum)
Medication Instructions:  Your physician has recommended you make the following change in your medication:  1.  START Amlodipine 5 mg taking 1 tablet daily  *If you need a refill on your cardiac medications before your next appointment, please call your pharmacy*  Lab Work: TODAY:  CMET, CBC, TSH, MAG, & LIPID  If you have labs (blood work) drawn today and your tests are completely normal, you will receive your results only by: Marland Kitchen MyChart Message (if you have MyChart) OR . A paper copy in the mail If you have any lab test that is abnormal or we need to change your treatment, we will call you to review the results.  Testing/Procedures: None ordered  Follow-Up: At [facility], you and your health needs are our priority.  As part of our continuing mission to provide you with exceptional heart care, we have created designated Provider Care Teams.  These Care Teams include your primary Cardiologist (physician) and Advanced Practice Providers (APPs -  Physician Assistants and Nurse Practitioners) who all work together to provide you with the care you need, when you need it.  Your next appointment:   12 months  The format for your next appointment:   In Person  Provider:   You may see Ena Dawley, MD or one of the following Advanced Practice Providers on your designated Care Team:    Melina Copa, PA-C  Ermalinda Barrios, PA-C   Other Instructions  Please get a blood pressure cuff that goes on your arm. The wrist ones can be inaccurate. If possible, try to select one that also reports your heart rate. To check your blood pressure, choose a time about 3 hours after taking your blood pressure medicines. Remain seated in a chair for 5 minutes quietly beforehand, then check it. When you get a cuff, please get those readings and send in MyChart message with them for our review (I.e. Tuesday or Wednesday). Let us know going forward if you are tending to see blood pressures of greater than 130  on the top number or 80 on the bottom number.

## 2018-11-04 ENCOUNTER — Telehealth: Payer: Self-pay | Admitting: *Deleted

## 2018-11-04 DIAGNOSIS — Z79899 Other long term (current) drug therapy: Secondary | ICD-10-CM

## 2018-11-04 NOTE — Telephone Encounter (Signed)
Call placed to pt re: lab results and recommendations.  Left a message for pt to call back.

## 2018-11-04 NOTE — Telephone Encounter (Signed)
-----   Message from Charlie Pitter, [place] sent at 11/01/2018  5:20 PM EDT ----- Please let pt know overall labs look OK except a few comments - CMET looks good except blood sugar is in the pre-diabetes range. Mg level is slightly below goal but in absence of any heart rhythm history abnormalities, can increase sources in diet rather than a new med. Please increase dietary intake of healthy sources of magnesium including leafy greens, nuts, seeds, fish, beans, whole grains, avocados, yogurt, and bananas.  His LDL is still too high given his CAD. Would recommend switching simvastatin to Crestor 20mg  daily with planned liver/lipids in 6 weeks. Dayna Dunn PA-C

## 2018-11-11 MED ORDER — ROSUVASTATIN CALCIUM 20 MG PO TABS: 20.0000 mg | ORAL_TABLET | Freq: Every day | ORAL | 3 refills | Status: DC

## 2018-11-13 MED ORDER — AMLODIPINE BESYLATE 10 MG PO TABS: 10.0000 mg | ORAL_TABLET | Freq: Every day | ORAL | 3 refills | Status: DC

## 2018-11-13 NOTE — Telephone Encounter (Signed)
Please thank him for sharing this information! As long as tolerating amlodipine well, would increase amlodipine to 10mg  daily and send in rx to his usual mail order. Ask him to continue to follow BP and send in 3-4 days worth of readings on the higher dose. Thanks. Riven Beebe PA-C

## 2018-11-27 ENCOUNTER — Encounter: Payer: Self-pay | Admitting: *Deleted

## 2018-12-30 ENCOUNTER — Other Ambulatory Visit: Payer: 59 | Admitting: *Deleted

## 2018-12-30 ENCOUNTER — Other Ambulatory Visit: Payer: Self-pay

## 2018-12-30 DIAGNOSIS — Z79899 Other long term (current) drug therapy: Secondary | ICD-10-CM

## 2018-12-30 LAB — LIPID PANEL
Chol/HDL Ratio: 2.5 ratio (ref 0.0–5.0)
Cholesterol, Total: 143 mg/dL (ref 100–199)
HDL: 57 mg/dL (ref >39)
LDL Chol Calc (NIH): 70 mg/dL (ref 0–99)
Triglycerides: 83 mg/dL (ref 0–149)
VLDL Cholesterol Cal: 16 mg/dL (ref 5–40)

## 2018-12-30 LAB — HEPATIC FUNCTION PANEL
ALT: 44 IU/L (ref 0–44)
AST: 48 IU/L — ABNORMAL HIGH (ref 0–40)
Albumin: 4.6 g/dL (ref 3.8–4.8)
Alkaline Phosphatase: 71 IU/L (ref 39–117)
Bilirubin Total: 0.4 mg/dL (ref 0.0–1.2)
Bilirubin, Direct: 0.17 mg/dL (ref 0.00–0.40)
Total Protein: 7 g/dL (ref 6.0–8.5)

## 2019-01-02 ENCOUNTER — Telehealth: Payer: Self-pay | Admitting: *Deleted

## 2019-01-02 DIAGNOSIS — Z79899 Other long term (current) drug therapy: Secondary | ICD-10-CM

## 2019-01-02 NOTE — Telephone Encounter (Signed)
See result note.  

## 2019-02-06 ENCOUNTER — Encounter: Payer: Self-pay | Admitting: Family Medicine

## 2019-03-03 ENCOUNTER — Other Ambulatory Visit: Payer: Self-pay

## 2019-03-03 ENCOUNTER — Other Ambulatory Visit: Payer: 59 | Admitting: *Deleted

## 2019-03-03 DIAGNOSIS — Z79899 Other long term (current) drug therapy: Secondary | ICD-10-CM

## 2019-03-03 LAB — HEPATIC FUNCTION PANEL
ALT: 43 IU/L (ref 0–44)
AST: 40 IU/L (ref 0–40)
Albumin: 4.2 g/dL (ref 3.8–4.8)
Alkaline Phosphatase: 62 IU/L (ref 39–117)
Bilirubin Total: 0.4 mg/dL (ref 0.0–1.2)
Bilirubin, Direct: 0.17 mg/dL (ref 0.00–0.40)
Total Protein: 6.6 g/dL (ref 6.0–8.5)

## 2019-04-11 ENCOUNTER — Ambulatory Visit: Payer: 59 | Attending: Internal Medicine

## 2019-04-11 DIAGNOSIS — Z23 Encounter for immunization: Secondary | ICD-10-CM

## 2019-04-11 NOTE — Progress Notes (Signed)
   Covid-19 Vaccination Clinic  Name:  ALEXA PANDYA    MRN: [id number] DOB: 09/03/1955  04/11/2019  Mr. Stubblefield was observed post Covid-19 immunization for 30 minutes based on pre-vaccination screening without incident. He was provided with Vaccine Information Sheet and instruction to access the V-Safe system.   Mr. Siam was instructed to call 911 with any severe reactions post vaccine: Marland Kitchen Difficulty breathing  . Swelling of face and throat  . A fast heartbeat  . A bad rash all over body  . Dizziness and weakness   Immunizations Administered    Name Date Dose VIS Date Route   Moderna COVID-19 Vaccine 04/11/2019  8:04 AM 0.5 mL 12/03/2018 Intramuscular   Manufacturer: Moderna   Lot: [id number]   [id number]

## 2019-05-13 ENCOUNTER — Ambulatory Visit: Payer: 59

## 2019-05-13 ENCOUNTER — Other Ambulatory Visit: Payer: Self-pay

## 2019-05-13 ENCOUNTER — Ambulatory Visit: Payer: 59 | Attending: Internal Medicine

## 2019-05-13 DIAGNOSIS — Z23 Encounter for immunization: Secondary | ICD-10-CM

## 2019-05-13 NOTE — Progress Notes (Signed)
   Covid-19 Vaccination Clinic  Name:  Michael [name]    MRN: [id number] DOB: 09/22/1955  05/13/2019  [name] was observed post Covid-19 immunization for 15 minutes without incident. He was provided with Vaccine Information Sheet and instruction to access the V-Safe system.   [name] was instructed to call 911 with any severe reactions post vaccine: Marland Kitchen Difficulty breathing  . Swelling of face and throat  . A fast heartbeat  . A bad rash all over body  . Dizziness and weakness   Immunizations Administered    Name Date Dose VIS Date Route   Moderna COVID-19 Vaccine 05/13/2019  9:05 AM 0.5 mL 12/2018 Intramuscular   Manufacturer: Moderna   Lot: [id number]   [id number]

## 2019-07-25 ENCOUNTER — Other Ambulatory Visit: Payer: Self-pay

## 2019-07-25 MED ORDER — AMLODIPINE BESYLATE 10 MG PO TABS: 10.0000 mg | ORAL_TABLET | Freq: Every day | ORAL | 0 refills | Status: DC

## 2019-08-08 ENCOUNTER — Other Ambulatory Visit: Payer: Self-pay

## 2019-08-08 MED ORDER — AMLODIPINE BESYLATE 10 MG PO TABS: 10.0000 mg | ORAL_TABLET | Freq: Every day | ORAL | 0 refills | Status: DC

## 2019-08-19 ENCOUNTER — Other Ambulatory Visit: Payer: Self-pay

## 2019-08-19 MED ORDER — ROSUVASTATIN CALCIUM 20 MG PO TABS: 20.0000 mg | ORAL_TABLET | Freq: Every day | ORAL | 3 refills | Status: DC

## 2019-09-01 NOTE — Progress Notes (Signed)
Cardiology Office Note    Date:  09/03/2019   ID:  Michael [name], DOB 11-19-1955, MRN [id number]  PCP:  Erven Colla, DO  Cardiologist: Michael Dawley, MD EPS: None  Chief Complaint  Patient presents with  . Follow-up    History of Present Illness:  Michael [name] is a 64 y.o. male with history of CAD s/p CABG 1999, carotid artery disease s/p left carotid endarterectomy 2014 (followed by [facility], last duplex 01/2018), gout, hypertension, hyperlipidemia, GERD, beef/pork allergy (alpha gal.Last echo in 2014 showed mild LVH, EF 60-65%, no RWMA. Last cath in 2015 showed patent grafts an normal LV function. He was seen in the ED 03/2018 with chest wall pain and ruled out. Nuclear stress test 03/2018 showed 0.5 - 1.0 mm ST depression in the inferior and lateral leads but no evidence of ischemia on the scintographic images, felt to be low risk per Dr. Francesca Oman review    Patient last saw Melina Copa, PA-C 11/01/2018 and was doing well.  Blood pressure was up a little and she added amlodipine 5 mg once daily.  Patient comes in for yearly f/u. Denies chest pain, dyspnea, dizziness, edema or presyncope. 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    Past Medical History:  Diagnosis Date  . Allergy   . Back pain   . Bruises easily   . CAD (coronary artery disease)    a. s/p CABG 1999. b. Cath 2015 patent grafts.  . Carotid artery disease ([place])    a. s/p L CEA 2014, followed by [facility].  . Essential hypertension   . GERD (gastroesophageal reflux disease)    was on Prilosec;is not currently on any meds  . Gout    takes Allopurinol daily  . Hyperglycemia   . Hyperlipidemia   . Malignant hyperthermia    Potential but not Confirmed  . Neck pain    yrs ago and saw a chiropractor occasionally  . Urinary urgency     Past Surgical History:  Procedure Laterality Date  . ANKLE FRACTURE SURGERY Left 1981  . CARDIAC CATHETERIZATION  2015  . CAROTID ENDARTERECTOMY Left 04-29-12     cea  . COLONOSCOPY    . COLONOSCOPY N/A 11/21/2013   Procedure: COLONOSCOPY;  Surgeon: Michael Binder, MD;  [facility];  Service: Endoscopy;  Laterality: N/A;  1:15 PM  . CORONARY ARTERY BYPASS GRAFT  1999   x 4  . CYSTOSCOPY    . ENDARTERECTOMY Left 04/29/2012   Procedure: ENDARTERECTOMY CAROTID;  Surgeon: Michael Posner, MD;  Location: [facility];  Service: Vascular;  Laterality: Left;  with primary closure of artery  . ESOPHAGOGASTRODUODENOSCOPY    . FRACTURE SURGERY  1980   screws placed and in 1981 screws removed(this is when was told about potential for [place]  . INGUINAL HERNIA REPAIR Bilateral Aug. 2005  . LEFT HEART CATHETERIZATION WITH CORONARY/GRAFT ANGIOGRAM N/A 06/10/2013   Procedure: LEFT HEART CATHETERIZATION WITH Michael [name];  Surgeon: Michael M Martinique, MD;  Location: [facility] CATH LAB;  Service: Cardiovascular;  Laterality: N/A;    Current Medications: Current Meds  Medication Sig  . amLODipine (NORVASC) 10 MG tablet Take 1 tablet (10 mg total) by mouth daily.  Marland Kitchen aspirin EC 81 MG tablet Take 1 tablet (81 mg total) by mouth daily.  . carvedilol (COREG) 25 MG tablet Take 1 tablet (25 mg total) by mouth 2 (two) times daily with a meal.  . cetirizine (ZYRTEC) 10  MG tablet Take 10 mg by mouth daily.  . famotidine (PEPCID) 10 MG tablet Take 10 mg by mouth 2 (two) times daily.  . Multiple Vitamin (MULTIVITAMIN) tablet Take 1 tablet by mouth daily.  . rosuvastatin (CRESTOR) 20 MG tablet Take 1 tablet (20 mg total) by mouth daily.  . [DISCONTINUED] amLODipine (NORVASC) 10 MG tablet Take 1 tablet (10 mg total) by mouth daily. Please keep upcoming appt in September for future refills. Thank you  . [DISCONTINUED] carvedilol (COREG) 25 MG tablet TAKE 1 TABLET TWICE A DAY WITH MEALS  . [DISCONTINUED] rosuvastatin (CRESTOR) 20 MG tablet Take 1 tablet (20 mg total) by mouth daily.     Allergies:   Anesthetics, amide; Ramipril; and Losartan   Social History   Socioeconomic  History  . Marital status: Divorced    Spouse name: Not on file  . Number of children: Not on file  . Years of education: Not on file  . Highest education level: Not on file  Occupational History  . Occupation: CONSTRUCTION/MAINT    Employer: [employer]    Comment: [employer]  Tobacco Use  . Smoking status: Former Smoker    Packs/day: 1.00    Years: 20.00    Pack years: 20.00    Types: Cigarettes    Quit date: 05/14/1996    Years since quitting: 23.3  . Smokeless tobacco: Never Used  . Tobacco comment: quit 1995  Vaping Use  . Vaping Use: Never used  Substance and Sexual Activity  . Alcohol use: Yes    Alcohol/week: 6.0 standard drinks    Types: 6 Cans of beer per week  . Drug use: No  . Sexual activity: Never  Other Topics Concern  . Not on file  Social History Narrative  . Not on file   Social Determinants of Health   Financial Resource Strain:   . Difficulty of Paying Living Expenses: Not on file  Food Insecurity:   . Worried About Charity fundraiser in the Last Year: Not on file  . Ran Out of Food in the Last Year: Not on file  Transportation Needs:   . Lack of Transportation (Medical): Not on file  . Lack of Transportation (Non-Medical): Not on file  Physical Activity:   . Days of Exercise per Week: Not on file  . Minutes of Exercise per Session: Not on file  Stress:   . Feeling of Stress : Not on file  Social Connections:   . Frequency of Communication with Friends and Family: Not on file  . Frequency of Social Gatherings with Friends and Family: Not on file  . Attends Religious Services: Not on file  . Active Member of Clubs or Organizations: Not on file  . Attends Archivist Meetings: Not on file  . Marital Status: Not on file     Family History:  The patient's   family history includes Arthritis in his mother; Breast cancer in his mother; CVA (age of onset: 45) in his brother; Hypertension in his brother, father, and mother;  Kidney disease in his mother; Kidney failure in his mother; Ovarian cancer in his mother.   ROS:   Please see the history of present illness.    ROS All other systems reviewed and are negative.   PHYSICAL EXAM:   VS:  BP 130/74   Pulse 74   Ht 5\' 10"  (1.778 m)   Wt 215 lb 6.4 oz (97.7 kg)   SpO2 97%  BMI 30.91 kg/m   Physical Exam  GEN: Well nourished, well developed, in no acute distress  Neck: right carotid bruitno JVD,  or masses Cardiac:RRR; 1/6 systolic murmur LSB  Respiratory:  clear to auscultation bilaterally, normal work of breathing GI: soft, nontender, nondistended, + BS Ext: without cyanosis, clubbing, or edema, Good distal pulses bilaterally Neuro:  Alert and Oriented x 3 Psych: euthymic mood, full affect  Wt Readings from Last 3 Encounters:  09/03/19 215 lb 6.4 oz (97.7 kg)  11/01/18 207 lb 3.2 oz (94 kg)  03/18/18 209 lb 3.2 oz (94.9 kg)      Studies/Labs Reviewed:   EKG:  EKG is not ordered today.    Recent Labs: 11/01/2018: BUN 13; Creatinine, Ser 0.92; Hemoglobin 14.2; Magnesium 1.7; Platelets 173; Potassium 4.6; Sodium 141; TSH 1.150 03/03/2019: ALT 43   Lipid Panel    Component Value Date/Time   CHOL 143 12/30/2018 0728   TRIG 83 12/30/2018 0728   HDL 57 12/30/2018 0728   CHOLHDL 2.5 12/30/2018 0728   CHOLHDL 4.0 01/05/2015 0855   VLDL 28 01/05/2015 0855   LDLCALC 70 12/30/2018 0728    Additional studies/ records that were reviewed today include:  Addendum by Thayer Headings, MD on Tue Mar 19, 2018 11:51 AM  The patient walked for a total of 9 minutes on a standard Bruce protocol treadmill test. He achieved a peak heart rate of 139 which is 88% predicted maximal heart rate.  Blood pressure response to exercise was hypertensive. There was mild ST depression in the inferior and lateral leads.  Nuclear stress EF: 65%. The left ventricular ejection fraction is normal (55-65%).  This is a low risk study. There was 0.5 - 1.0 mm ST depression  in the inferior and lateral leads but no evidence of ischemia on the scintographic images. No evidence of previous infarction  There is no ischemia on the myoview images. No eividence of previous infarction   Finalized by Nahser, Wonda Cheng, MD on Tue Mar 19, 2018 11:26 AM  Nuclear stress EF: 65%. The left ventricular ejection fraction is normal (55-65%).  Horizontal ST segment depression ST segment depression of 0.5 mm was noted during stress in the II, V4, V5 and V6 leads.  This is a low risk study. There was 0.5 - 1.0 mm ST depression in the inferior and lateral leads but no evidence of ischemia on the scintographic images. No evidence of previous infarction  The study is normal.       ASSESSMENT:    1. Coronary artery disease involving native coronary artery of native heart without angina pectoris   2. Essential hypertension   3. Hyperlipidemia, unspecified hyperlipidemia type   4. S/P carotid endarterectomy   5. HYPERTENSION, CONTROLLED   6. Mixed hyperlipidemia      PLAN:  In order of problems listed above:  CAD status post CABG 1999, last cath 2015 patent grafts normal LV function, NST 03/2018 0.5 to 1 mm ST depression inferior lateral but no evidence of ischemia. No chest pain. Recommend 150 min exercise weekly. Continue coreg, crestor, amlodipine, ASA  Essential hypertension BP controlled  Hyperlipidemia check labs today on crestor  Carotid artery disease status post left carotid endarterectomy 2014 followed by [facility]-due for dopplers 01/2020 and f/u with them.    Medication Adjustments/Labs and Tests Ordered: Current medicines are reviewed at length with the patient today.  Concerns regarding medicines are outlined above.  Medication changes, Labs and Tests ordered today are listed in  the Patient Instructions below. Patient Instructions  Medication Instructions:  Your physician recommends that you continue on your current medications as directed. Please refer to the  Current Medication list given to you today.  *If you need a refill on your cardiac medications before your next appointment, please call your pharmacy*   Lab Work: TODAY:  LIPID, CMET, & CBC  If you have labs (blood work) drawn today and your tests are completely normal, you will receive your results only by: Marland Kitchen MyChart Message (if you have MyChart) OR . A paper copy in the mail If you have any lab test that is abnormal or we need to change your treatment, we will call you to review the results.   Testing/Procedures: None ordered   Follow-Up: At [facility], you and your health needs are our priority.  As part of our continuing mission to provide you with exceptional heart care, we have created designated Provider Care Teams.  These Care Teams include your primary Cardiologist (physician) and Advanced Practice Providers (APPs -  Physician Assistants and Nurse Practitioners) who all work together to provide you with the care you need, when you need it.  We recommend signing up for the patient portal called "MyChart".  Sign up information is provided on this After Visit Summary.  MyChart is used to connect with patients for Virtual Visits (Telemedicine).  Patients are able to view lab/test results, encounter notes, upcoming appointments, etc.  Non-urgent messages can be sent to your provider as well.   To learn more about what you can do with MyChart, go to [email or web address].    Your next appointment:   12 month(s)  The format for your next appointment:   In Person  Provider:   You may see Michael Dawley, MD or one of the following Advanced Practice Providers on your designated Care Team:    Melina Copa, PA-C  Ermalinda Barrios, PA-C    Other Instructions  Please work on exercising at least 150 minutes a week    Signed, Ermalinda Barrios, PA-C  09/03/2019 8:01 AM    [address], [place], [place]  [address] Phone: [phone]; Fax:  [phone]

## 2019-09-03 ENCOUNTER — Encounter: Payer: Self-pay | Admitting: Physician Assistant

## 2019-09-03 ENCOUNTER — Ambulatory Visit (INDEPENDENT_AMBULATORY_CARE_PROVIDER_SITE_OTHER): Payer: 59 | Admitting: Physician Assistant

## 2019-09-03 ENCOUNTER — Other Ambulatory Visit: Payer: Self-pay

## 2019-09-03 VITALS — BP 130/74 | HR 74 | Ht 70.0 in | Wt 215.4 lb

## 2019-09-03 DIAGNOSIS — I2510 Atherosclerotic heart disease of native coronary artery without angina pectoris: Secondary | ICD-10-CM

## 2019-09-03 DIAGNOSIS — E785 Hyperlipidemia, unspecified: Secondary | ICD-10-CM | POA: Diagnosis not present

## 2019-09-03 DIAGNOSIS — Z9889 Other specified postprocedural states: Secondary | ICD-10-CM | POA: Diagnosis not present

## 2019-09-03 DIAGNOSIS — E782 Mixed hyperlipidemia: Secondary | ICD-10-CM

## 2019-09-03 DIAGNOSIS — I10 Essential (primary) hypertension: Secondary | ICD-10-CM | POA: Diagnosis not present

## 2019-09-03 LAB — LIPID PANEL
Chol/HDL Ratio: 2.8 ratio (ref 0.0–5.0)
Cholesterol, Total: 144 mg/dL (ref 100–199)
HDL: 52 mg/dL (ref >39)
LDL Chol Calc (NIH): 70 mg/dL (ref 0–99)
Triglycerides: 122 mg/dL (ref 0–149)
VLDL Cholesterol Cal: 22 mg/dL (ref 5–40)

## 2019-09-03 LAB — CBC
Hematocrit: 39.8 % (ref 37.5–51.0)
Hemoglobin: 13.7 g/dL (ref 13.0–17.7)
MCH: 30.9 pg (ref 26.6–33.0)
MCHC: 34.4 g/dL (ref 31.5–35.7)
MCV: 90 fL (ref 79–97)
Platelets: 170 10*3/uL (ref 150–450)
RBC: 4.43 x10E6/uL (ref 4.14–5.80)
RDW: 12.6 % (ref 11.6–15.4)
WBC: 5 10*3/uL (ref 3.4–10.8)

## 2019-09-03 LAB — COMPREHENSIVE METABOLIC PANEL
ALT: 33 IU/L (ref 0–44)
AST: 35 IU/L (ref 0–40)
Albumin/Globulin Ratio: 1.9 (ref 1.2–2.2)
Albumin: 4.6 g/dL (ref 3.8–4.8)
Alkaline Phosphatase: 65 IU/L (ref 48–121)
BUN/Creatinine Ratio: 14 (ref 10–24)
BUN: 13 mg/dL (ref 8–27)
Bilirubin Total: 0.4 mg/dL (ref 0.0–1.2)
CO2: 25 mmol/L (ref 20–29)
Calcium: 9.4 mg/dL (ref 8.6–10.2)
Chloride: 100 mmol/L (ref 96–106)
Creatinine, Ser: 0.91 mg/dL (ref 0.76–1.27)
GFR calc Af Amer: 103 mL/min/{1.73_m2} (ref >59)
GFR calc non Af Amer: 89 mL/min/{1.73_m2} (ref >59)
Globulin, Total: 2.4 g/dL (ref 1.5–4.5)
Glucose: 100 mg/dL — ABNORMAL HIGH (ref 65–99)
Potassium: 4.2 mmol/L (ref 3.5–5.2)
Sodium: 140 mmol/L (ref 134–144)
Total Protein: 7 g/dL (ref 6.0–8.5)

## 2019-09-03 MED ORDER — CARVEDILOL 25 MG PO TABS: 25.0000 mg | ORAL_TABLET | Freq: Two times a day (BID) | ORAL | 3 refills | Status: DC

## 2019-09-03 MED ORDER — AMLODIPINE BESYLATE 10 MG PO TABS: 10.0000 mg | ORAL_TABLET | Freq: Every day | ORAL | 3 refills | Status: DC

## 2019-09-03 MED ORDER — ROSUVASTATIN CALCIUM 20 MG PO TABS: 20.0000 mg | ORAL_TABLET | Freq: Every day | ORAL | 3 refills | Status: DC

## 2019-09-03 NOTE — Patient Instructions (Addendum)
Medication Instructions:  Your physician recommends that you continue on your current medications as directed. Please refer to the Current Medication list given to you today.  *If you need a refill on your cardiac medications before your next appointment, please call your pharmacy*   Lab Work: TODAY:  LIPID, CMET, & CBC  If you have labs (blood work) drawn today and your tests are completely normal, you will receive your results only by:  [place] (if you have MyChart) OR  A paper copy in the mail If you have any lab test that is abnormal or we need to change your treatment, we will call you to review the results.   Testing/Procedures: None ordered   Follow-Up: At [facility], you and your health needs are our priority.  As part of our continuing mission to provide you with exceptional heart care, we have created designated Provider Care Teams.  These Care Teams include your primary Cardiologist (physician) and Advanced Practice Providers (APPs -  Physician Assistants and Nurse Practitioners) who all work together to provide you with the care you need, when you need it.  We recommend signing up for the patient portal called "MyChart".  Sign up information is provided on this After Visit Summary.  MyChart is used to connect with patients for Virtual Visits (Telemedicine).  Patients are able to view lab/test results, encounter notes, upcoming appointments, etc.  Non-urgent messages can be sent to your provider as well.   To learn more about what you can do with MyChart, go to [email or web address].    Your next appointment:   12 month(s)  The format for your next appointment:   In Person  Provider:   You may see Ena Dawley, MD or one of the following Advanced Practice Providers on your designated Care Team:    Melina Copa, PA-C  Ermalinda Barrios, PA-C    Other Instructions  Please work on exercising at least 150 minutes a week

## 2019-09-05 ENCOUNTER — Other Ambulatory Visit: Payer: Self-pay

## 2019-09-05 ENCOUNTER — Encounter: Payer: Self-pay | Admitting: Family Medicine

## 2019-09-05 ENCOUNTER — Ambulatory Visit (INDEPENDENT_AMBULATORY_CARE_PROVIDER_SITE_OTHER): Payer: 59 | Admitting: Family Medicine

## 2019-09-05 VITALS — BP 138/80 | HR 83 | Temp 97.8°F | Ht 70.0 in | Wt 211.0 lb

## 2019-09-05 DIAGNOSIS — K644 Residual hemorrhoidal skin tags: Secondary | ICD-10-CM

## 2019-09-05 DIAGNOSIS — M25552 Pain in left hip: Secondary | ICD-10-CM

## 2019-09-05 MED ORDER — PROCTOFOAM HC 1-1 % EX FOAM: 1.0000 | Freq: Two times a day (BID) | CUTANEOUS | 1 refills | Status: DC

## 2019-09-05 NOTE — Progress Notes (Signed)
Patient ID: Michael [name], male    DOB: [birth year], 64 y.o.   MRN: [id number]   Chief Complaint  Patient presents with  . Hemorrhoids   Subjective:    HPI  CC-hemorrhoid came up about one week ago.  Tried prep H cream. Witch hazel pads.   Pain on left hip, not hurting now.  Worse with getting up from seated position. On left hip. No new injury or trauma. 2 months. meds- none. A few times bothers with getting up. Not feeling ready for hip xray at this time.  Hemorrhoids- had them before.  Feeling occ after wiping. Usually resolves on it's own. Nodule that hasn't resolved.  Irritation and pain. Going regularrly every morning. Now feeling not fully emptying.  Then feeling might needing to go again 35mins later. occ some blood stool.  Some blood with wiping. 1cm erythema and swelling and tender. Has seen specialist in past for this and they "injected" it.    Medical History Michael [name] has a past medical history of Allergy, Back pain, Bruises easily, CAD (coronary artery disease), Carotid artery disease ([place]), Essential hypertension, GERD (gastroesophageal reflux disease), Gout, Hyperglycemia, Hyperlipidemia, Malignant hyperthermia, Neck pain, and Urinary urgency.   Outpatient Encounter Medications as of 09/05/2019  Medication Sig  . amLODipine (NORVASC) 10 MG tablet Take 1 tablet (10 mg total) by mouth daily.  Marland Kitchen aspirin EC 81 MG tablet Take 1 tablet (81 mg total) by mouth daily.  . carvedilol (COREG) 25 MG tablet Take 1 tablet (25 mg total) by mouth 2 (two) times daily with a meal.  . cetirizine (ZYRTEC) 10 MG tablet Take 10 mg by mouth daily.  . famotidine (PEPCID) 10 MG tablet Take 10 mg by mouth 2 (two) times daily.  . Multiple Vitamin (MULTIVITAMIN) tablet Take 1 tablet by mouth daily.  . rosuvastatin (CRESTOR) 20 MG tablet Take 1 tablet (20 mg total) by mouth daily.  . hydrocortisone-pramoxine (PROCTOFOAM HC) rectal foam Place 1 applicator rectally 2 (two) times daily.   No  facility-administered encounter medications on file as of 09/05/2019.     Review of Systems  Constitutional: Negative for chills and fever.  Gastrointestinal: Positive for rectal pain. Negative for abdominal pain, anal bleeding, blood in stool, constipation, diarrhea, nausea and vomiting.  Musculoskeletal:       +left hip pain  Skin: Negative for rash.     Vitals BP 138/80   Pulse 83   Temp 97.8 F (36.6 C)   Ht 5\' 10"  (1.778 m)   Wt 211 lb (95.7 kg)   SpO2 97%   BMI 30.28 kg/m   Objective:   Physical Exam Vitals and nursing note reviewed. Exam conducted with a chaperone present.  Constitutional:      General: He is not in acute distress.    Appearance: Normal appearance. He is normal weight.  Genitourinary:    Comments: +1cm hemorrhoid at 9 oclock, erythema and tenderness. No drainage or thrombosed.   Musculoskeletal:        General: Tenderness (Left ASIA of left hip. ) present. No swelling, deformity or signs of injury. Normal range of motion.     Right lower leg: No edema.     Left lower leg: No edema.     Comments: No erythema, swelling, or rash on left hip   Skin:    General: Skin is warm and dry.     Findings: No rash.  Neurological:     Mental Status: He is alert.  Assessment and Plan   1. External hemorrhoids  2. Left hip pain   Left hip pain- Aleve 2x per day for pain in hip for next week.  Call if not improving. pt declining xray at this time.  Ext hemorrhoid- non-thrombosed. proctoform for hemorrhoid and call if not improving in next 3-5 days.  Sitz bath 3-4 x per day and ice pack prn.  Handout given for care of hemorrhoids.  Increase water, fiber, and colace 2x per day.  F/u 1-2 wks if not improving.

## 2019-09-05 NOTE — Patient Instructions (Signed)
Colace 2x per day and lots of water. Eating more high fiber diet. Use protofoam and sitz bath 32mins 3x per day as needed.   Hemorrhoids Hemorrhoids are swollen veins in and around the rectum or anus. There are two types of hemorrhoids:  Internal hemorrhoids. These occur in the veins that are just inside the rectum. They may poke through to the outside and become irritated and painful.  External hemorrhoids. These occur in the veins that are outside the anus and can be felt as a painful swelling or hard lump near the anus. Most hemorrhoids do not cause serious problems, and they can be managed with home treatments such as diet and lifestyle changes. If home treatments do not help the symptoms, procedures can be done to shrink or remove the hemorrhoids. What are the causes? This condition is caused by increased pressure in the anal area. This pressure may result from various things, including:  Constipation.  Straining to have a bowel movement.  Diarrhea.  Pregnancy.  Obesity.  Sitting for long periods of time.  Heavy lifting or other activity that causes you to strain.  Anal sex.  Riding a bike for a long period of time. What are the signs or symptoms? Symptoms of this condition include:  Pain.  Anal itching or irritation.  Rectal bleeding.  Leakage of stool (feces).  Anal swelling.  One or more lumps around the anus. How is this diagnosed? This condition can often be diagnosed through a visual exam. Other exams or tests may also be done, such as:  An exam that involves feeling the rectal area with a gloved hand (digital rectal exam).  An exam of the anal canal that is done using a small tube (anoscope).  A blood test, if you have lost a significant amount of blood.  A test to look inside the colon using a flexible tube with a camera on the end (sigmoidoscopy or colonoscopy). How is this treated? This condition can usually be treated at home. However, various  procedures may be done if dietary changes, lifestyle changes, and other home treatments do not help your symptoms. These procedures can help make the hemorrhoids smaller or remove them completely. Some of these procedures involve surgery, and others do not. Common procedures include:  Rubber band ligation. Rubber bands are placed at the base of the hemorrhoids to cut off their blood supply.  Sclerotherapy. Medicine is injected into the hemorrhoids to shrink them.  Infrared coagulation. A type of light energy is used to get rid of the hemorrhoids.  Hemorrhoidectomy surgery. The hemorrhoids are surgically removed, and the veins that supply them are tied off.  Stapled hemorrhoidopexy surgery. The surgeon staples the base of the hemorrhoid to the rectal wall. Follow these instructions at home: Eating and drinking   Eat foods that have a lot of fiber in them, such as whole grains, beans, nuts, fruits, and vegetables.  Ask your health care provider about taking products that have added fiber (fiber supplements).  Reduce the amount of fat in your diet. You can do this by eating low-fat dairy products, eating less red meat, and avoiding processed foods.  Drink enough fluid to keep your urine pale yellow. Managing pain and swelling   Take warm sitz baths for 20 minutes, 3-4 times a day to ease pain and discomfort. You may do this in a bathtub or using a portable sitz bath that fits over the toilet.  If directed, apply ice to the affected area. Using ice  packs between sitz baths may be helpful. ? Put ice in a plastic bag. ? Place a towel between your skin and the bag. ? Leave the ice on for 20 minutes, 2-3 times a day. General instructions  Take over-the-counter and prescription medicines only as told by your health care provider.  Use medicated creams or suppositories as told.  Get regular exercise. Ask your health care provider how much and what kind of exercise is best for you. In  general, you should do moderate exercise for at least 30 minutes on most days of the week (150 minutes each week). This can include activities such as walking, biking, or yoga.  Go to the bathroom when you have the urge to have a bowel movement. Do not wait.  Avoid straining to have bowel movements.  Keep the anal area dry and clean. Use wet toilet paper or moist towelettes after a bowel movement.  Do not sit on the toilet for long periods of time. This increases blood pooling and pain.  Keep all follow-up visits as told by your health care provider. This is important. Contact a health care provider if you have:  Increasing pain and swelling that are not controlled by treatment or medicine.  Difficulty having a bowel movement, or you are unable to have a bowel movement.  Pain or inflammation outside the area of the hemorrhoids. Get help right away if you have:  Uncontrolled bleeding from your rectum. Summary  Hemorrhoids are swollen veins in and around the rectum or anus.  Most hemorrhoids can be managed with home treatments such as diet and lifestyle changes.  Taking warm sitz baths can help ease pain and discomfort.  In severe cases, procedures or surgery can be done to shrink or remove the hemorrhoids. This information is not intended to replace advice given to you by your health care provider. Make sure you discuss any questions you have with your health care provider. Document Revised: 05/17/2018 Document Reviewed: 05/10/2017 Elsevier Patient Education  [place].

## 2020-01-26 ENCOUNTER — Other Ambulatory Visit: Payer: 59

## 2020-01-26 ENCOUNTER — Other Ambulatory Visit: Payer: Self-pay

## 2020-01-26 DIAGNOSIS — Z20822 Contact with and (suspected) exposure to covid-19: Secondary | ICD-10-CM

## 2020-01-28 LAB — NOVEL CORONAVIRUS, NAA: SARS-CoV-2, NAA: DETECTED — AB

## 2020-01-28 LAB — SARS-COV-2, NAA 2 DAY TAT

## 2020-01-29 ENCOUNTER — Ambulatory Visit (HOSPITAL_COMMUNITY)
Admission: RE | Admit: 2020-01-29 | Discharge: 2020-01-29 | Disposition: A | Discharge status: Home / Self Care | Payer: 59 | Source: Ambulatory Visit | Attending: Pulmonary Disease | Admitting: Pulmonary Disease

## 2020-01-29 ENCOUNTER — Telehealth: Payer: Self-pay | Admitting: Infectious Diseases

## 2020-01-29 ENCOUNTER — Telehealth: Payer: Self-pay | Admitting: *Deleted

## 2020-01-29 ENCOUNTER — Other Ambulatory Visit: Payer: Self-pay | Admitting: Infectious Diseases

## 2020-01-29 DIAGNOSIS — U071 COVID-19: Secondary | ICD-10-CM | POA: Diagnosis not present

## 2020-01-29 MED ORDER — METHYLPREDNISOLONE SODIUM SUCC 125 MG IJ SOLR: 125.0000 mg | Freq: Once | INTRAMUSCULAR | Status: DC | PRN

## 2020-01-29 MED ORDER — SODIUM CHLORIDE 0.9 % IV SOLN: INTRAVENOUS | Status: DC | PRN

## 2020-01-29 MED ORDER — DIPHENHYDRAMINE HCL 50 MG/ML IJ SOLN: 50.0000 mg | Freq: Once | INTRAMUSCULAR | Status: DC | PRN

## 2020-01-29 MED ORDER — FAMOTIDINE IN NACL 20-0.9 MG/50ML-% IV SOLN: 20.0000 mg | Freq: Once | INTRAVENOUS | Status: DC | PRN

## 2020-01-29 MED ORDER — ALBUTEROL SULFATE HFA 108 (90 BASE) MCG/ACT IN AERS: 2.0000 | INHALATION_SPRAY | Freq: Once | RESPIRATORY_TRACT | Status: DC | PRN

## 2020-01-29 MED ORDER — EPINEPHRINE 0.3 MG/0.3ML IJ SOAJ: 0.3000 mg | Freq: Once | INTRAMUSCULAR | Status: DC | PRN

## 2020-01-29 MED ORDER — SOTROVIMAB 500 MG/8ML IV SOLN
500.0000 mg | Freq: Once | INTRAVENOUS | Status: AC
  Administered 2020-01-29: 500 mg via INTRAVENOUS

## 2020-01-29 NOTE — Progress Notes (Signed)
I connected by phone with Michael [name] on 01/29/2020 at 11:08 AM to discuss the potential use of a new treatment for mild to moderate COVID-19 viral infection in non-hospitalized patients.  This patient is a 65 y.o. male that meets the FDA criteria for Emergency Use Authorization of COVID monoclonal antibody sotrovimab.  Has a (+) direct SARS-CoV-2 viral test result  Has mild or moderate COVID-19   Is NOT hospitalized due to COVID-19  Is within 10 days of symptom onset  Has at least one of the high risk factor(s) for progression to severe COVID-19 and/or hospitalization as defined in EUA.  Specific high risk criteria : Older age (>/= 66 yo), BMI > 25, Cardiovascular disease or hypertension and Other high risk medical condition per CDC:  incompletely vaccinated   I have spoken and communicated the following to the patient or parent/caregiver regarding COVID monoclonal antibody treatment:  1. FDA has authorized the emergency use for the treatment of mild to moderate COVID-19 in adults and pediatric patients with positive results of direct SARS-CoV-2 viral testing who are 93 years of age and older weighing at least 40 kg, and who are at high risk for progressing to severe COVID-19 and/or hospitalization.  2. The significant known and potential risks and benefits of COVID monoclonal antibody, and the extent to which such potential risks and benefits are unknown.  3. Information on available alternative treatments and the risks and benefits of those alternatives, including clinical trials.  4. Patients treated with COVID monoclonal antibody should continue to self-isolate and use infection control measures (e.g., wear mask, isolate, social distance, avoid sharing personal items, clean and disinfect "high touch" surfaces, and frequent handwashing) according to CDC guidelines.   5. The patient or parent/caregiver has the option to accept or refuse COVID monoclonal antibody treatment.  After  reviewing this information with the patient, the patient has agreed to receive one of the available covid 19 monoclonal antibodies and will be provided an appropriate fact sheet prior to infusion. Michael Madeira, NP 01/29/2020 11:08 AM

## 2020-01-29 NOTE — Progress Notes (Incomplete)
Diagnosis: COVID-19  Physician: Dr. Patrick Wright  Procedure: [facility] Med: Sotrovimab infusion - Provided patient with sotrovimab fact sheet for patients, parents, and caregivers prior to infusion.   Complications: No immediate complications noted  Discharge: Discharged home    

## 2020-01-29 NOTE — Telephone Encounter (Signed)
Discussed monoclonal antibody therapy with patient. Consented and will have him come today for treatment at 12:30.   Instructions provided via mychart.    Janene Madeira, MSN, NP-C [facility] [email or web address] Pager: [phone] Office: [phone] [place]: [phone]

## 2020-01-29 NOTE — Progress Notes (Signed)
Diagnosis: COVID-19  Physician: Dr. Patrick Wright  Procedure: [facility] Infusion Clinic Med: Sotrovimab infusion - Provided patient with sotrovimab fact sheet for patients, parents, and caregivers prior to infusion.   Complications: No immediate complications noted  Discharge: Discharged home    

## 2020-01-29 NOTE — Discharge Instructions (Signed)

## 2020-01-29 NOTE — Progress Notes (Signed)
Patient reviewed Fact Sheet for Patients, Parents, and Caregivers for Emergency Use Authorization (EUA) of sotrovimab for the Treatment of Coronavirus. Patient also reviewed and is agreeable to the estimated cost of treatment. Patient is agreeable to proceed.   

## 2020-01-29 NOTE — Telephone Encounter (Signed)
Called to discuss with patient about COVID-19 symptoms and the use of one of the available treatments for those with mild to moderate Covid symptoms and at a high risk of hospitalization.  Pt appears to qualify for outpatient treatment due to co-morbid conditions and/or a member of an at-risk group in accordance with the FDA Emergency Use Authorization.    Symptom onset: 01/24/20 Today is day 6 Vaccinated: Yes Booster? No Immunocompromised? No Qualifiers: Heart disease-hx of bypass, HTN  Continues to have cough, runny nose and congestion. Aware a provider will call back.  Tarry Kos

## 2020-03-22 ENCOUNTER — Ambulatory Visit (INDEPENDENT_AMBULATORY_CARE_PROVIDER_SITE_OTHER): Payer: 59 | Admitting: Family Medicine

## 2020-03-22 ENCOUNTER — Ambulatory Visit (HOSPITAL_COMMUNITY)
Admission: RE | Admit: 2020-03-22 | Discharge: 2020-03-22 | Disposition: A | Discharge status: Home / Self Care | Payer: 59 | Source: Ambulatory Visit | Attending: Family Medicine | Admitting: Family Medicine

## 2020-03-22 ENCOUNTER — Other Ambulatory Visit (HOSPITAL_COMMUNITY)
Admission: RE | Admit: 2020-03-22 | Discharge: 2020-03-22 | Disposition: A | Discharge status: Home / Self Care | Payer: 59 | Source: Ambulatory Visit | Attending: Family Medicine | Admitting: Family Medicine

## 2020-03-22 ENCOUNTER — Other Ambulatory Visit: Payer: Self-pay

## 2020-03-22 ENCOUNTER — Encounter: Payer: Self-pay | Admitting: Family Medicine

## 2020-03-22 VITALS — BP 122/84 | HR 85 | Temp 97.2°F | Ht 70.0 in | Wt 217.6 lb

## 2020-03-22 DIAGNOSIS — R0781 Pleurodynia: Secondary | ICD-10-CM | POA: Insufficient documentation

## 2020-03-22 LAB — CBC WITH DIFFERENTIAL/PLATELET
Abs Immature Granulocytes: 0.01 10*3/uL (ref 0.00–0.07)
Basophils Absolute: 0.1 10*3/uL (ref 0.0–0.1)
Basophils Relative: 1 %
Eosinophils Absolute: 0.3 10*3/uL (ref 0.0–0.5)
Eosinophils Relative: 6 %
HCT: 40.1 % (ref 39.0–52.0)
Hemoglobin: 13.6 g/dL (ref 13.0–17.0)
Immature Granulocytes: 0 %
Lymphocytes Relative: 24 %
Lymphs Abs: 1.4 10*3/uL (ref 0.7–4.0)
MCH: 31.6 pg (ref 26.0–34.0)
MCHC: 33.9 g/dL (ref 30.0–36.0)
MCV: 93.3 fL (ref 80.0–100.0)
Monocytes Absolute: 0.8 10*3/uL (ref 0.1–1.0)
Monocytes Relative: 14 %
Neutro Abs: 3.2 10*3/uL (ref 1.7–7.7)
Neutrophils Relative %: 55 %
Platelets: 170 10*3/uL (ref 150–400)
RBC: 4.3 MIL/uL (ref 4.22–5.81)
RDW: 12.6 % (ref 11.5–15.5)
WBC: 5.8 10*3/uL (ref 4.0–10.5)
nRBC: 0 % (ref 0.0–0.2)

## 2020-03-22 LAB — COMPREHENSIVE METABOLIC PANEL
ALT: 36 U/L (ref 0–44)
AST: 32 U/L (ref 15–41)
Albumin: 4.2 g/dL (ref 3.5–5.0)
Alkaline Phosphatase: 50 U/L (ref 38–126)
Anion gap: 11 (ref 5–15)
BUN: 14 mg/dL (ref 8–23)
CO2: 25 mmol/L (ref 22–32)
Calcium: 9 mg/dL (ref 8.9–10.3)
Chloride: 103 mmol/L (ref 98–111)
Creatinine, Ser: 0.93 mg/dL (ref 0.61–1.24)
GFR, Estimated: >60 mL/min (ref >60)
Glucose, Bld: 105 mg/dL — ABNORMAL HIGH (ref 70–99)
Potassium: 3.8 mmol/L (ref 3.5–5.1)
Sodium: 139 mmol/L (ref 135–145)
Total Bilirubin: 0.9 mg/dL (ref 0.3–1.2)
Total Protein: 7.4 g/dL (ref 6.5–8.1)

## 2020-03-22 LAB — AMYLASE: Amylase: 69 U/L (ref 28–100)

## 2020-03-22 LAB — LIPASE, BLOOD: Lipase: 36 U/L (ref 11–51)

## 2020-03-22 NOTE — Progress Notes (Signed)
Patient ID: Michael [name], male    DOB: Jul 10, 1955, 65 y.o.   MRN: [id number]   Chief Complaint  Patient presents with  . pain in RUQ for about a week- agrivated with movement   Subjective:  CC; right side pain  This is a new problem.  Presents today with right upper quadrant pain.  Reports that pain is constant, aggravated with movement and deep breathing.  Had Covid in January 2022 this symptom was not present during his Covid infection.  He has had no recent upper respiratory infection symptoms.  Denies fever, chills shortness of breath.  Points to the pain under right rib cage area.  Denies injury.    Medical History Michael [name] has a past medical history of Allergy, Back pain, Bruises easily, CAD (coronary artery disease), Carotid artery disease ([place]), Essential hypertension, GERD (gastroesophageal reflux disease), Gout, Hyperglycemia, Hyperlipidemia, Malignant hyperthermia, Neck pain, and Urinary urgency.   Outpatient Encounter Medications as of 03/22/2020  Medication Sig  . amLODipine (NORVASC) 10 MG tablet Take 1 tablet (10 mg total) by mouth daily.  Marland Kitchen aspirin EC 81 MG tablet Take 1 tablet (81 mg total) by mouth daily.  . carvedilol (COREG) 25 MG tablet Take 1 tablet (25 mg total) by mouth 2 (two) times daily with a meal.  . cetirizine (ZYRTEC) 10 MG tablet Take 10 mg by mouth daily.  . famotidine (PEPCID) 10 MG tablet Take 10 mg by mouth 2 (two) times daily.  . hydrocortisone-pramoxine (PROCTOFOAM HC) rectal foam Place 1 applicator rectally 2 (two) times daily.  . Multiple Vitamin (MULTIVITAMIN) tablet Take 1 tablet by mouth daily.  . rosuvastatin (CRESTOR) 20 MG tablet Take 1 tablet (20 mg total) by mouth daily.   No facility-administered encounter medications on file as of 03/22/2020.     Review of Systems  Constitutional: Negative for chills and fever.  HENT: Negative for congestion.   Respiratory: Negative for cough and shortness of breath.   Cardiovascular: Positive for  chest pain.       Right side of chest   Gastrointestinal: Negative for abdominal pain, diarrhea, nausea and vomiting.  Neurological: Negative for dizziness, light-headedness and headaches.     Vitals BP 122/84   Pulse 85   Temp (!) 97.2 F (36.2 C) (Oral)   Ht 5\' 10"  (1.778 m)   Wt 217 lb 9.6 oz (98.7 kg)   SpO2 98%   BMI 31.22 kg/m   Objective:   Physical Exam Vitals reviewed.  Cardiovascular:     Rate and Rhythm: Normal rate and regular rhythm.     Heart sounds: Normal heart sounds.  Pulmonary:     Effort: Pulmonary effort is normal.     Breath sounds: Normal breath sounds.     Comments: Right sided rib pain and pain with deep inspiration.  Covid infection in January.  Abdominal:     General: Bowel sounds are normal. There is no distension.     Tenderness: There is no abdominal tenderness.  Skin:    General: Skin is warm and dry.  Neurological:     General: No focal deficit present.     Mental Status: He is alert.  Psychiatric:        Behavior: Behavior normal.      Assessment and Plan   1. Rib pain on right side - CBC with Differential/Platelet - Comprehensive metabolic panel - Amylase - Lipase - DG Chest 2 View   Pain present on right side under rib cage, worse  with movement and deep inspiration.  Will send to [facility] for stat labs and chest x-ray.  Abdominal exam negative.  Update: Chest x-ray negative.  Glucose slightly elevated, all other labs normal.  Nurse to call patient to update.   Agrees with plan of care discussed today. Understands warning signs to seek further care: chest pain, shortness of breath, any significant change in health.  Understands to follow-up if symptoms do not improve, or worsen.  Will notify patient tomorrow, to see if he would like to trial 2 weeks of an NSAID, otherwise, recommend supportive therapy.  Pecolia Ades, NP 03/22/20

## 2020-03-23 ENCOUNTER — Telehealth: Payer: Self-pay | Admitting: *Deleted

## 2020-03-23 NOTE — Telephone Encounter (Signed)
Patient states he would like to hold on Naproxen for now. The pain is not that bad for now and will call back if he changes his mind and wants medication sent in.

## 2020-03-23 NOTE — Telephone Encounter (Signed)
  Per Santiago Glad NP:  Please call and find out if he wants to try Naproxen twice per day for 14 days for the pain. Make sure he doesn't have history of GI bleed, heart attack, or htn.

## 2020-03-24 ENCOUNTER — Telehealth: Payer: Self-pay

## 2020-03-24 NOTE — Telephone Encounter (Signed)
So connect with patient I would recommend trying prescription anti-inflammatory such as naproxen 500 mg twice daily over the course of the next 10 days as needed #20  A different option is Mobic 15 mg, #10, 1 daily for 7 to 10 days if he is interested  If other requests (such as hydrocodone) please let me know thank you

## 2020-03-24 NOTE — Telephone Encounter (Signed)
Pt seen Santiago Glad the other day on right side pain and to call back if he changed his mind about something for pain and he wants to see if something can be sent in now. Pt uses [facility]  Pt call back [phone]

## 2020-03-24 NOTE — Telephone Encounter (Signed)
Left message to return call 

## 2020-03-24 NOTE — Telephone Encounter (Signed)
Note from Santiago Glad stated she recommended naproxen and patient decllined

## 2020-03-24 NOTE — Telephone Encounter (Signed)
Nurses Please touch base with Michael [name] More than likely he has already tried a NSAID?  As for medication for pain-some individuals will choose to try hydrocodone to take the edge off the discomfort.  This particular type of medication is a narcotic.  Typical usage is for as needed basis over the course of the next several days.  Not meant for long-term use.  Can cause drowsiness so therefore it is recommended to only utilize this medication when at home.  If he is interested in this please send message back to me along with which pharmacy and I can send some in later this afternoon Thanks-Dr.

## 2020-03-25 ENCOUNTER — Other Ambulatory Visit: Payer: Self-pay | Admitting: Family Medicine

## 2020-03-25 DIAGNOSIS — R0781 Pleurodynia: Secondary | ICD-10-CM

## 2020-03-25 MED ORDER — MELOXICAM 15 MG PO TABS: 15.0000 mg | ORAL_TABLET | Freq: Every day | ORAL | 0 refills | Status: DC

## 2020-03-25 NOTE — Telephone Encounter (Signed)
Meloxicam sent to [facility].  Thanks, KD

## 2020-03-25 NOTE — Telephone Encounter (Signed)
Pt returned call. Pt states that he is still having pain in abdominal/chest area but more excruciating pain in left hip. Did agree with Naproxen but then Pt states that by the end of the day yesterday he could barely walk and was unable to sleep last night. Pt states that this morning the pain has eased up. Please advise. Thank you.  [place]

## 2020-03-25 NOTE — Telephone Encounter (Signed)
Pt aware meloxicam sent to pharm.

## 2020-08-02 ENCOUNTER — Other Ambulatory Visit: Payer: Self-pay

## 2020-08-02 ENCOUNTER — Ambulatory Visit (INDEPENDENT_AMBULATORY_CARE_PROVIDER_SITE_OTHER): Payer: 59 | Admitting: Family Medicine

## 2020-08-02 ENCOUNTER — Encounter: Payer: Self-pay | Admitting: Family Medicine

## 2020-08-02 VITALS — BP 140/82 | HR 77 | Temp 97.5°F | Ht 70.0 in | Wt 213.0 lb

## 2020-08-02 DIAGNOSIS — M545 Low back pain, unspecified: Secondary | ICD-10-CM | POA: Diagnosis not present

## 2020-08-02 LAB — POCT URINALYSIS DIPSTICK
Spec Grav, UA: 1.025 (ref 1.010–1.025)
pH, UA: 5 (ref 5.0–8.0)

## 2020-08-02 MED ORDER — MELOXICAM 15 MG PO TABS: 15.0000 mg | ORAL_TABLET | Freq: Every day | ORAL | 0 refills | Status: DC

## 2020-08-02 MED ORDER — METHOCARBAMOL 500 MG PO TABS: 500.0000 mg | ORAL_TABLET | Freq: Three times a day (TID) | ORAL | 0 refills | Status: DC | PRN
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
## 2020-10-21 NOTE — Progress Notes (Signed)
Cardiology Office Note:    Date:  10/22/2020   ID:  Beather Arbour, DOB [birth year], MRN [id number]  PCP:  Michael Colla, DO   [facility] Providers Cardiologist:  Michael Bergeron, MD Cardiology APP:  Michael [name]    Referring MD: Michael Colla, DO   Chief Complaint:  F/u for CAD    Patient Profile:   Michael [name] is a 65 y.o. male with:  Coronary artery disease   S/p CABG in 1999 Cath in 2015: all grafts patent Myoview 03/2018: low risk  Carotid artery disease  S/p L CEA in 2014 Hypertension Hyperlipidemia GERD Alpha gal allergy Gout  Prior CV studies: GATED SPECT MYO PERF W/EXERCISE STRESS 1D 03/19/2018  Nuclear stress EF: 65%. The left ventricular ejection fraction is normal (55-65%).  This is a low risk study. There was 0.5 - 1.0 mm ST depression in the inferior and lateral leads but no evidence of ischemia on the scintographic images. No evidence of previous infarction  VAS US CAROTID DUPLEX BILATERAL 01/29/2018 RICA 1-39; L CEA patent  Cardiac catheterization 06/10/2013 LM ostial 30 LAD 100 LCx mid 95; OM2 100 RCA proximal 100 SVG-D2 patent SVG-OM2 patent with 30-40 mid graft RIMA-RCA patent, PDA occluded LIMA-LAD patent EF 55-60  Echocardiogram 01/04/12 Mild LVH, EF 60-65, no RWMA - Left ventricle: The cavity size w   History of Present Illness: Michael [name] was last seen in 9/21 by Michael Barrios, PA-C.  He returns for follow-up.    He is here alone.  He is doing well without chest pain, shortness of breath, syncope, orthopnea, leg edema.     Past Medical History:  Diagnosis Date   Allergy    Back pain    Bruises easily    CAD (coronary artery disease)    a. s/p CABG 1999. b. Cath 2015 patent grafts.   Carotid artery disease ([place])    a. s/p L CEA 2014, followed by [facility].   Essential hypertension    GERD (gastroesophageal reflux disease)    was on Prilosec;is not currently on any meds   Gout    takes Allopurinol daily    Hyperglycemia    Hyperlipidemia    Malignant hyperthermia    Potential but not Confirmed   Neck pain    yrs ago and saw a chiropractor occasionally   Urinary urgency    Current Medications: Current Meds  Medication Sig   aspirin EC 81 MG tablet Take 1 tablet (81 mg total) by mouth daily.   cetirizine (ZYRTEC) 10 MG tablet Take 10 mg by mouth daily.   famotidine (PEPCID) 10 MG tablet Take 10 mg by mouth 2 (two) times daily.   Multiple Vitamin (MULTIVITAMIN) tablet Take 1 tablet by mouth daily.   [DISCONTINUED] amLODipine (NORVASC) 10 MG tablet Take 1 tablet (10 mg total) by mouth daily.   [DISCONTINUED] carvedilol (COREG) 25 MG tablet Take 1 tablet (25 mg total) by mouth 2 (two) times daily with a meal.   [DISCONTINUED] rosuvastatin (CRESTOR) 20 MG tablet Take 1 tablet (20 mg total) by mouth daily.    Allergies:   Anesthetics, amide; Ramipril; and Losartan   Social History   Tobacco Use   Smoking status: Former    Packs/day: 1.00    Years: 20.00    Pack years: 20.00    Types: Cigarettes    Quit date: 05/14/1996    Years since quitting: 24.4   Smokeless tobacco: Never   Tobacco comments:  quit 1995  Vaping Use   Vaping Use: Never used  Substance Use Topics   Alcohol use: Yes    Alcohol/week: 6.0 standard drinks    Types: 6 Cans of beer per week   Drug use: No    Family Hx: The patient's family history includes Arthritis in his mother; Breast cancer in his mother; CVA (age of onset: 71) in his brother; Hypertension in his brother, father, and mother; Kidney disease in his mother; Kidney failure in his mother; Ovarian cancer in his mother.  Review of Systems  Cardiovascular:  Negative for claudication.  see HPI  EKGs/Labs/Other Test Reviewed:    EKG:  EKG is   ordered today.  The ekg ordered today demonstrates sinus bradycardia, HR 57, normal axis, nonspecific ST-T wave changes, QTC is 379, no change from prior tracing  Recent Labs: 03/22/2020: ALT 36; BUN 14;  Creatinine, Ser 0.93; Hemoglobin 13.6; Platelets 170; Potassium 3.8; Sodium 139   Recent Lipid Panel Lab Results  Component Value Date/Time   CHOL 144 09/03/2019 08:02 AM   TRIG 122 09/03/2019 08:02 AM   HDL 52 09/03/2019 08:02 AM   LDLCALC 70 09/03/2019 08:02 AM     Risk Assessment/Calculations:          Physical Exam:    VS:  BP 116/60   Pulse (!) 57   Ht 5\' 10"  (1.778 m)   Wt 215 lb 12.8 oz (97.9 kg)   SpO2 98%   BMI 30.96 kg/m     Wt Readings from Last 3 Encounters:  10/22/20 215 lb 12.8 oz (97.9 kg)  08/02/20 213 lb (96.6 kg)  03/22/20 217 lb 9.6 oz (98.7 kg)    Constitutional:      Appearance: Healthy appearance. Not in distress.  Neck:     Vascular: JVD normal.  Pulmonary:     Effort: Pulmonary effort is normal.     Breath sounds: No wheezing. No rales.  Cardiovascular:     Normal rate. Regular rhythm. Normal S1. Normal S2.      Murmurs: There is no murmur.  Edema:    Peripheral edema absent.  Abdominal:     Palpations: Abdomen is soft.  Skin:    General: Skin is warm and dry.  Neurological:     General: No focal deficit present.     Mental Status: Alert and oriented to person, place and time.     Cranial Nerves: Cranial nerves are intact.     ASSESSMENT & PLAN:   1. Coronary artery disease involving native coronary artery of native heart without angina pectoris History of CABG in 1999.  Cardiac catheterization in 2015 demonstrated patent grafts.  Myoview in 2020 was low risk.  He is doing well without anginal symptoms.  Continue current dose of aspirin, carvedilol, rosuvastatin.  Follow-up in 1 year.  He was previously followed by Michael [name].  I will have him follow-up with Michael [name] or me.    2. Bilateral carotid artery stenosis 3. S/P carotid endarterectomy Status post left carotid endarterectomy in 2014.  Follow-up with vascular surgery as planned.  4. Essential hypertension Blood pressure well controlled on a combination of amlodipine and  carvedilol.  5. Mixed hyperlipidemia Obtain fasting CMET, lipids today.  He remains on rosuvastatin.        Dispo:  Return in about 1 year (around 10/22/2021) for Routine follow up in 1 year with Michael [name] or Michael [name]..   Medication Adjustments/Labs and Tests Ordered: Current medicines are reviewed  at length with the patient today.  Concerns regarding medicines are outlined above.  Tests Ordered: Orders Placed This Encounter  Procedures   Lipid panel   Comprehensive metabolic panel   EKG [id number]   Medication Changes: Meds ordered this encounter  Medications   amLODipine (NORVASC) 10 MG tablet    Sig: Take 1 tablet (10 mg total) by mouth daily.    Dispense:  90 tablet    Refill:  3   carvedilol (COREG) 25 MG tablet    Sig: Take 1 tablet (25 mg total) by mouth 2 (two) times daily with a meal.    Dispense:  180 tablet    Refill:  3   rosuvastatin (CRESTOR) 20 MG tablet    Sig: Take 1 tablet (20 mg total) by mouth daily.    Dispense:  90 tablet    Refill:  3   Signed, Richardson Dopp, PA-C  10/22/2020 10:01 AM    [facility] [address], [place], [place]  [address] Phone: [phone]; Fax: [phone]

## 2020-10-22 ENCOUNTER — Other Ambulatory Visit: Payer: Self-pay

## 2020-10-22 ENCOUNTER — Ambulatory Visit (INDEPENDENT_AMBULATORY_CARE_PROVIDER_SITE_OTHER): Payer: 59 | Admitting: Physician Assistant

## 2020-10-22 ENCOUNTER — Encounter: Payer: Self-pay | Admitting: Physician Assistant

## 2020-10-22 VITALS — BP 116/60 | HR 57 | Ht 70.0 in | Wt 215.8 lb

## 2020-10-22 DIAGNOSIS — I2510 Atherosclerotic heart disease of native coronary artery without angina pectoris: Secondary | ICD-10-CM | POA: Diagnosis not present

## 2020-10-22 DIAGNOSIS — E782 Mixed hyperlipidemia: Secondary | ICD-10-CM

## 2020-10-22 DIAGNOSIS — Z9889 Other specified postprocedural states: Secondary | ICD-10-CM | POA: Diagnosis not present

## 2020-10-22 DIAGNOSIS — I6523 Occlusion and stenosis of bilateral carotid arteries: Secondary | ICD-10-CM | POA: Diagnosis not present

## 2020-10-22 DIAGNOSIS — I10 Essential (primary) hypertension: Secondary | ICD-10-CM

## 2020-10-22 LAB — COMPREHENSIVE METABOLIC PANEL
ALT: 28 IU/L (ref 0–44)
AST: 25 IU/L (ref 0–40)
Albumin/Globulin Ratio: 2 (ref 1.2–2.2)
Albumin: 4.7 g/dL (ref 3.8–4.8)
Alkaline Phosphatase: 60 IU/L (ref 44–121)
BUN/Creatinine Ratio: 13 (ref 10–24)
BUN: 12 mg/dL (ref 8–27)
Bilirubin Total: 0.4 mg/dL (ref 0.0–1.2)
CO2: 26 mmol/L (ref 20–29)
Calcium: 9.3 mg/dL (ref 8.6–10.2)
Chloride: 99 mmol/L (ref 96–106)
Creatinine, Ser: 0.91 mg/dL (ref 0.76–1.27)
Globulin, Total: 2.3 g/dL (ref 1.5–4.5)
Glucose: 103 mg/dL — ABNORMAL HIGH (ref 70–99)
Potassium: 4.2 mmol/L (ref 3.5–5.2)
Sodium: 140 mmol/L (ref 134–144)
Total Protein: 7 g/dL (ref 6.0–8.5)
eGFR: 93 mL/min/{1.73_m2} (ref >59)

## 2020-10-22 LAB — LIPID PANEL
Chol/HDL Ratio: 2.6 ratio (ref 0.0–5.0)
Cholesterol, Total: 142 mg/dL (ref 100–199)
HDL: 54 mg/dL (ref >39)
LDL Chol Calc (NIH): 73 mg/dL (ref 0–99)
Triglycerides: 77 mg/dL (ref 0–149)
VLDL Cholesterol Cal: 15 mg/dL (ref 5–40)

## 2020-10-22 MED ORDER — ROSUVASTATIN CALCIUM 20 MG PO TABS: 20.0000 mg | ORAL_TABLET | Freq: Every day | ORAL | 3 refills | Status: DC

## 2020-10-22 MED ORDER — AMLODIPINE BESYLATE 10 MG PO TABS: 10.0000 mg | ORAL_TABLET | Freq: Every day | ORAL | 3 refills | Status: DC

## 2020-10-22 MED ORDER — CARVEDILOL 25 MG PO TABS: 25.0000 mg | ORAL_TABLET | Freq: Two times a day (BID) | ORAL | 3 refills | Status: DC

## 2020-10-22 NOTE — Patient Instructions (Signed)
Medication Instructions:   Your physician recommends that you continue on your current medications as directed. Please refer to the Current Medication list given to you today.  *If you need a refill on your cardiac medications before your next appointment, please call your pharmacy*   Lab Work:  TODAY!!!!!LIPID/CMET If you have labs (blood work) drawn today and your tests are completely normal, you will receive your results only by: [place] (if you have MyChart) OR A paper copy in the mail If you have any lab test that is abnormal or we need to change your treatment, we will call you to review the results.   Testing/Procedures:  -NONE-   Follow-Up: At [facility], you and your health needs are our priority.  As part of our continuing mission to provide you with exceptional heart care, we have created designated Provider Care Teams.  These Care Teams include your primary Cardiologist (physician) and Advanced Practice Providers (APPs -  Physician Assistants and Nurse Practitioners) who all work together to provide you with the care you need, when you need it.  We recommend signing up for the patient portal called "MyChart".  Sign up information is provided on this After Visit Summary.  MyChart is used to connect with patients for Virtual Visits (Telemedicine).  Patients are able to view lab/test results, encounter notes, upcoming appointments, etc.  Non-urgent messages can be sent to your provider as well.   To learn more about what you can do with MyChart, go to [email or web address].    Your next appointment:   1 year(s)  The format for your next appointment:   In Person  Provider:   Gwyndolyn Kaufman, MD   Other Instructions  Your physician wants you to follow-up in: 1 Year with Dr. Johney Frame or Richardson Dopp, PA-C.  You will receive a reminder letter in the mail two months in advance. If you don't receive a letter, please call our office to schedule the follow-up  appointment.

## 2020-10-25 ENCOUNTER — Other Ambulatory Visit: Payer: Self-pay | Admitting: *Deleted

## 2020-10-25 DIAGNOSIS — I2510 Atherosclerotic heart disease of native coronary artery without angina pectoris: Secondary | ICD-10-CM

## 2020-10-25 DIAGNOSIS — E785 Hyperlipidemia, unspecified: Secondary | ICD-10-CM

## 2020-10-25 DIAGNOSIS — E782 Mixed hyperlipidemia: Secondary | ICD-10-CM

## 2020-10-25 MED ORDER — ROSUVASTATIN CALCIUM 40 MG PO TABS: 40.0000 mg | ORAL_TABLET | Freq: Every day | ORAL | 3 refills | Status: DC

## 2020-12-15 IMAGING — DX CHEST - 2 VIEW
3 series · 3 of 3 positions shown · non-contrast
Comparison: April 19, 2012

CLINICAL DATA: Chest pain

EXAM:
CHEST - 2 VIEW

[chest pa]
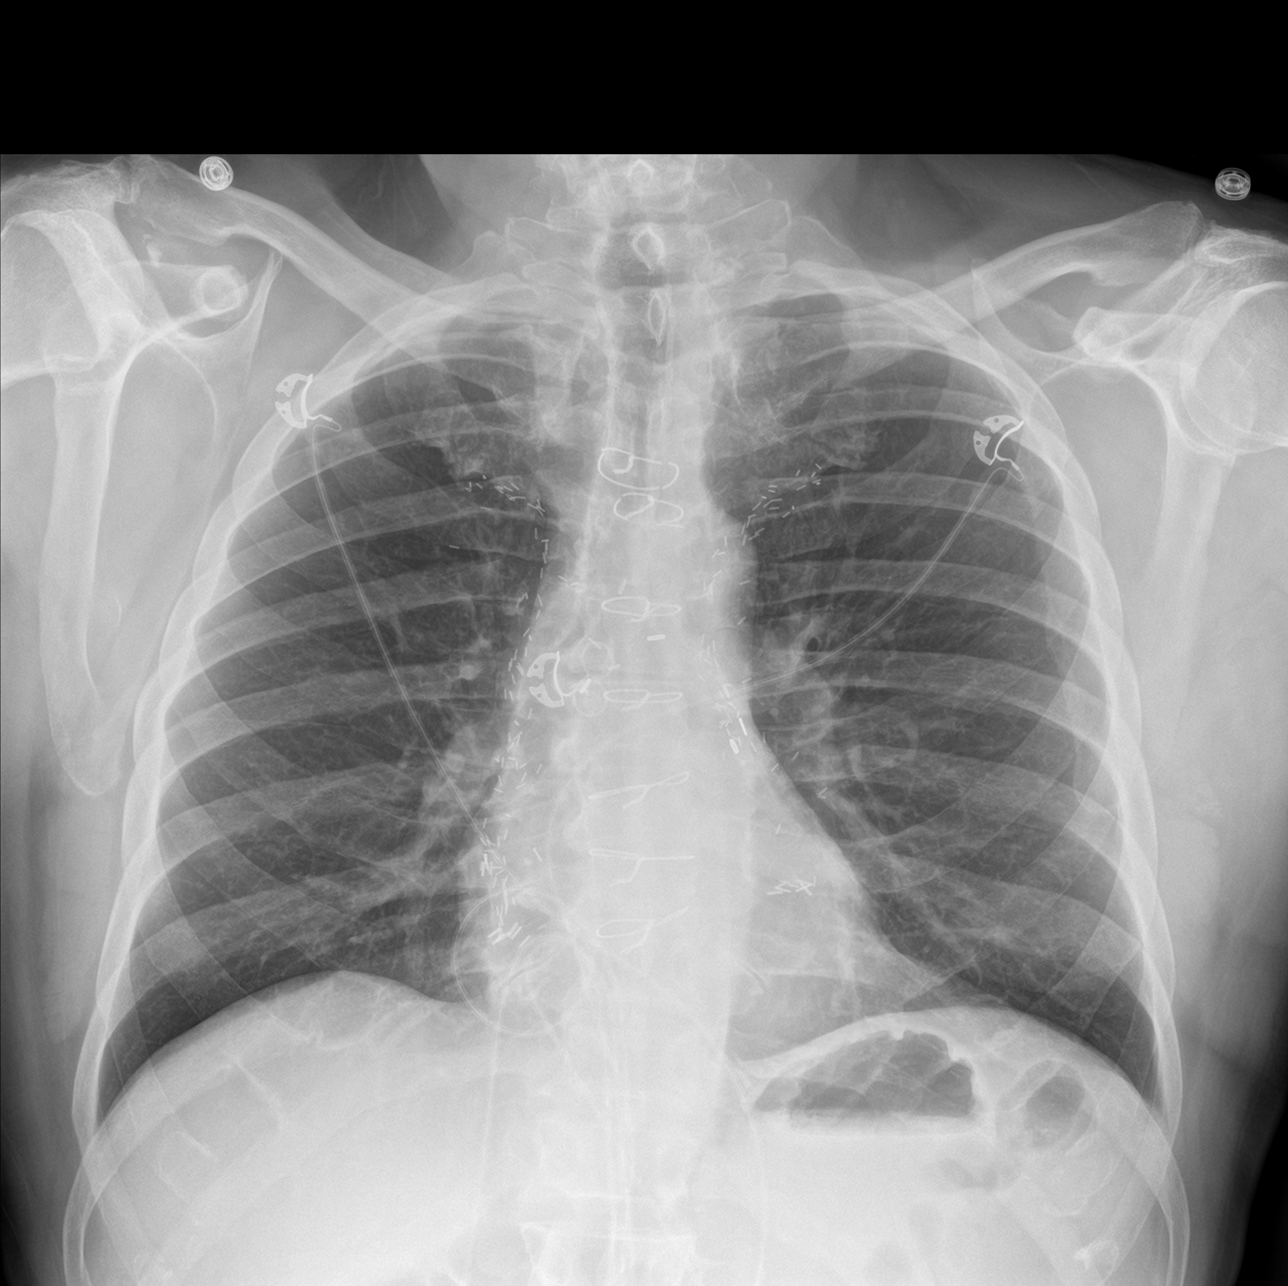

[chest lat (1 of 2)]
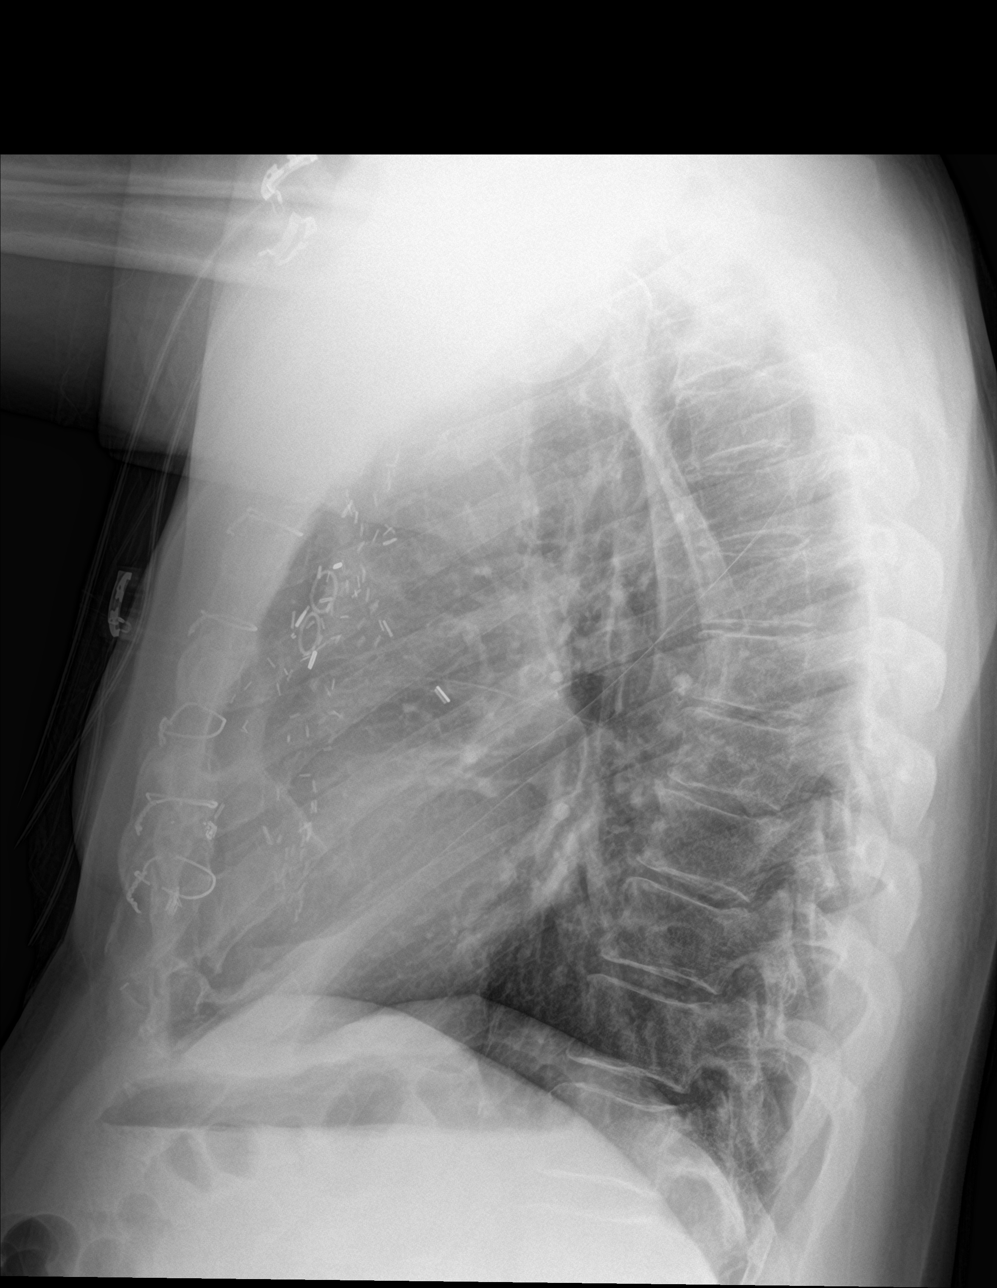

[chest lat (2 of 2)]
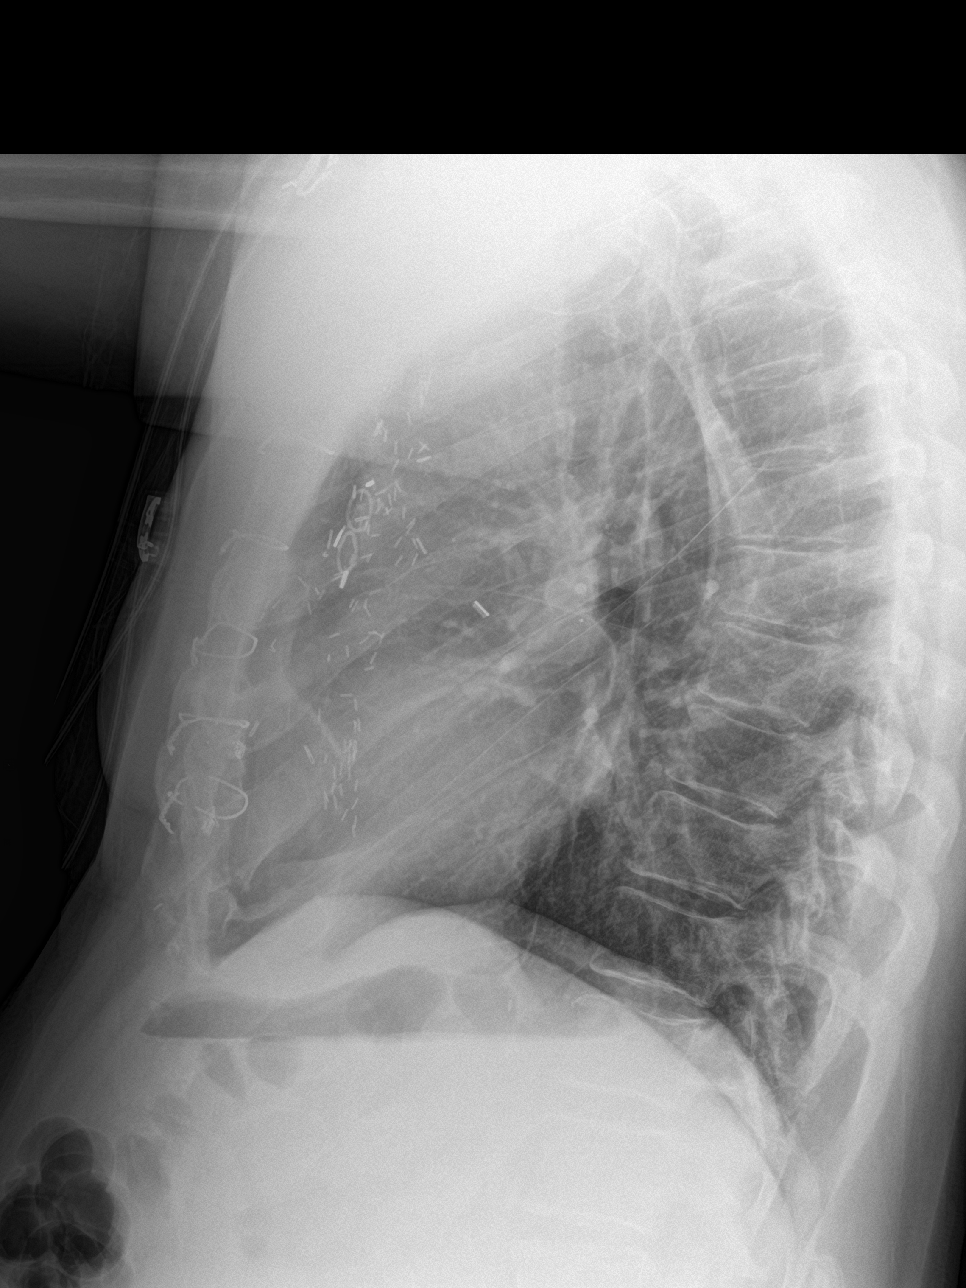

[3 of 3 positions shown; findings below may reference images not displayed]

FINDINGS: There is no appreciable edema or consolidation. The heart size and
pulmonary vascularity are normal. Patient is status post coronary
artery bypass grafting. No adenopathy evident. There is degenerative
change in each shoulder. There are surgical clips in the lower neck
region.
IMPRESSION: No edema or consolidation. Heart size within normal limits. Areas of
postoperative change.

## 2021-01-14 ENCOUNTER — Other Ambulatory Visit: Payer: Self-pay

## 2021-01-14 ENCOUNTER — Other Ambulatory Visit: Payer: 59 | Admitting: *Deleted

## 2021-01-14 DIAGNOSIS — E785 Hyperlipidemia, unspecified: Secondary | ICD-10-CM

## 2021-01-14 DIAGNOSIS — I2510 Atherosclerotic heart disease of native coronary artery without angina pectoris: Secondary | ICD-10-CM

## 2021-01-14 DIAGNOSIS — E782 Mixed hyperlipidemia: Secondary | ICD-10-CM

## 2021-01-14 LAB — HEPATIC FUNCTION PANEL
ALT: 26 IU/L (ref 0–44)
AST: 28 IU/L (ref 0–40)
Albumin: 4.3 g/dL (ref 3.8–4.8)
Alkaline Phosphatase: 80 IU/L (ref 44–121)
Bilirubin Total: 0.3 mg/dL (ref 0.0–1.2)
Bilirubin, Direct: 0.16 mg/dL (ref 0.00–0.40)
Total Protein: 6.7 g/dL (ref 6.0–8.5)

## 2021-01-14 LAB — LIPID PANEL
Chol/HDL Ratio: 2.6 ratio (ref 0.0–5.0)
Cholesterol, Total: 121 mg/dL (ref 100–199)
HDL: 47 mg/dL (ref >39)
LDL Chol Calc (NIH): 57 mg/dL (ref 0–99)
Triglycerides: 85 mg/dL (ref 0–149)
VLDL Cholesterol Cal: 17 mg/dL (ref 5–40)

## 2021-02-24 ENCOUNTER — Encounter: Payer: Self-pay | Admitting: Family Medicine

## 2021-02-24 ENCOUNTER — Telehealth: Payer: Self-pay

## 2021-02-24 ENCOUNTER — Telehealth: Payer: Self-pay | Admitting: *Deleted

## 2021-02-24 NOTE — Telephone Encounter (Signed)
Cook, Byron G, DO    Recommend Tylenol 1000 mg 3 times daily. We can discuss other treatment options/risks/benefits in the office.

## 2021-02-24 NOTE — Telephone Encounter (Signed)
Patient requesting refill of Meloxicam 15 mg #90- [facility]

## 2021-02-24 NOTE — Telephone Encounter (Signed)
Patient advised per Dr Lacinda Axon: Recommend Tylenol 1000 mg 3 times daily. We can discuss other treatment options/risks/benefits in the office  Patient verbalized understanding and stated he would call back to schedule an appointment.

## 2021-02-24 NOTE — Telephone Encounter (Signed)
Patient notified and wanted to know what would be safe to take regularly for chronic arthritis pain

## 2021-02-24 NOTE — Telephone Encounter (Signed)
e

## 2021-02-24 NOTE — Telephone Encounter (Signed)
Coral Spikes, DO    Given his cardiac history, I would not recommend that he take Mobic or any other NSAID on a long term basis.   Dr Lacinda Axon

## 2021-06-22 ENCOUNTER — Ambulatory Visit (INDEPENDENT_AMBULATORY_CARE_PROVIDER_SITE_OTHER): Payer: 59 | Admitting: Family Medicine

## 2021-06-22 VITALS — BP 134/80 | HR 62 | Temp 97.2°F | Ht 70.0 in | Wt 215.0 lb

## 2021-06-22 DIAGNOSIS — I10 Essential (primary) hypertension: Secondary | ICD-10-CM

## 2021-06-22 DIAGNOSIS — E782 Mixed hyperlipidemia: Secondary | ICD-10-CM | POA: Diagnosis not present

## 2021-06-22 DIAGNOSIS — Z0000 Encounter for general adult medical examination without abnormal findings: Secondary | ICD-10-CM | POA: Insufficient documentation

## 2021-06-22 DIAGNOSIS — Z130 Encounter for screening for diseases of the blood and blood-forming organs and certain disorders involving the immune mechanism: Secondary | ICD-10-CM

## 2021-06-22 DIAGNOSIS — N400 Enlarged prostate without lower urinary tract symptoms: Secondary | ICD-10-CM | POA: Insufficient documentation

## 2021-06-22 NOTE — Patient Instructions (Signed)
I have ordered your labs.   Continue your medications.  Follow up in 6 months to 1 year.  Take care  Dr. Lacinda Axon

## 2021-06-22 NOTE — Assessment & Plan Note (Signed)
Patient is doing fairly well at this time.  Offered neurology referral regarding his memory difficulty and he declines at this time.  Laboratory studies are ordered.  Discussed his preventative health care.  This was updated and health maintenance section.

## 2021-06-22 NOTE — Progress Notes (Signed)
Subjective:  Patient ID: Michael [name], male    DOB: 06/03/1955  Age: 66 y.o. MRN: [id number]  CC: Chief Complaint  Patient presents with   Annual Exam    Memory concerns     HPI:  66 year old male with CAD status post CABG, heart tension, BPH, history of gout, obesity presents for an annual physical exam.  Patient reports that he has had some memory difficulty.  He states that he has trouble remembering names and some word finding trouble at times.  He states that this is slightly troublesome.  Patient is in need of pneumonia vaccine, shingles vaccine, and tetanus.  Declines vaccines today.  Colonoscopy up-to-date.  He had some blood work done in January.  Follows closely with cardiology.  Denies chest pain or shortness of breath.  Patient Active Problem List   Diagnosis Date Noted   BPH (benign prostatic hyperplasia) 06/22/2021   Annual physical exam 06/22/2021   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   S/P carotid endarterectomy 06/03/2013   Gout 03/24/2012   Obesity (BMI 30-39.9) 12/06/2011   Coronary artery disease 12/15/2010   S/P coronary artery bypass graft x 4 12/15/2010   Hyperlipidemia 12/15/2008   Essential hypertension 12/15/2008    Social Hx   Social History   Socioeconomic History   Marital status: Divorced    Spouse name: Not on file   Number of children: Not on file   Years of education: Not on file   Highest education level: Not on file  Occupational History   Occupation: CONSTRUCTION/MAINT    Employer: [employer]    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 [employer]  Tobacco Use   Smoking status: Former    Packs/day: 1.00    Years: 20.00    Total pack years: 20.00    Types: Cigarettes    Quit date: 05/14/1996    Years since quitting: 25.1   Smokeless tobacco: Never   Tobacco comments:    quit 1995  Vaping Use   Vaping Use: Never used  Substance and Sexual Activity   Alcohol use: Yes    Alcohol/week: 6.0 standard drinks of alcohol    Types: 6 Cans of  beer per week   Drug use: No   Sexual activity: Never  Other Topics Concern   Not on file  Social History Narrative   Not on file   Social Determinants of Health   Financial Resource Strain: Not on file  Food Insecurity: Not on file  Transportation Needs: Not on file  Physical Activity: Not on file  Stress: Not on file  Social Connections: Not on file    Review of Systems Per HPI  Objective:  BP 134/80   Pulse 62   Temp (!) 97.2 F (36.2 C)   Ht '5\' 10"'  (1.778 m)   Wt 215 lb (97.5 kg)   SpO2 97%   BMI 30.85 kg/m      06/22/2021    1:54 PM 06/22/2021    1:28 PM 10/22/2020    9:04 AM  BP/Weight  Systolic BP 330 076 226  Diastolic BP 80 82 60  Wt. (Lbs)  215 215.8  BMI  30.85 kg/m2 30.96 kg/m2    Physical Exam Vitals and nursing note reviewed.  Constitutional:      General: He is not in acute distress.    Appearance: Normal appearance. He is not ill-appearing.  HENT:     Head: Normocephalic and atraumatic.  Eyes:     General:  Right eye: No discharge.        Left eye: No discharge.     Conjunctiva/sclera: Conjunctivae normal.  Cardiovascular:     Rate and Rhythm: Normal rate and regular rhythm.  Pulmonary:     Effort: Pulmonary effort is normal.     Breath sounds: Normal breath sounds. No wheezing, rhonchi or rales.  Abdominal:     General: There is no distension.     Palpations: Abdomen is soft.     Tenderness: There is no abdominal tenderness.  Neurological:     General: No focal deficit present.     Mental Status: He is alert.  Psychiatric:        Mood and Affect: Mood normal.        Behavior: Behavior normal.     Lab Results  Component Value Date   WBC 5.8 03/22/2020   HGB 13.6 03/22/2020   HCT 40.1 03/22/2020   PLT 170 03/22/2020   GLUCOSE 103 (H) 10/22/2020   CHOL 121 01/14/2021   TRIG 85 01/14/2021   HDL 47 01/14/2021   LDLCALC 57 01/14/2021   ALT 26 01/14/2021   AST 28 01/14/2021   NA 140 10/22/2020   K 4.2 10/22/2020    CL 99 10/22/2020   CREATININE 0.91 10/22/2020   BUN 12 10/22/2020   CO2 26 10/22/2020   TSH 1.150 11/01/2018   PSA 1.04 10/08/2013   INR 1.0 06/06/2013     Assessment & Plan:   Problem List Items Addressed This Visit       Cardiovascular and Mediastinum   Essential hypertension   Relevant Orders   CMP14+EGFR     Other   Annual physical exam - Primary    Patient is doing fairly well at this time.  Offered neurology referral regarding his memory difficulty and he declines at this time.  Laboratory studies are ordered.  Discussed his preventative health care.  This was updated and health maintenance section.      Hyperlipidemia   Relevant Orders   Lipid panel   Other Visit Diagnoses     Screening for deficiency anemia       Relevant Orders   CBC       Follow-up:  Return in about 6 months (around 12/22/2021).  [place]

## 2021-07-29 ENCOUNTER — Encounter: Payer: 59 | Admitting: Family Medicine

## 2021-08-15 ENCOUNTER — Telehealth: Payer: Self-pay | Admitting: *Deleted

## 2021-08-15 ENCOUNTER — Other Ambulatory Visit: Payer: Self-pay | Admitting: Family Medicine

## 2021-08-15 MED ORDER — NIRMATRELVIR/RITONAVIR (PAXLOVID)TABLET: 3.0000 | ORAL_TABLET | Freq: Two times a day (BID) | ORAL | 0 refills | Status: AC

## 2021-08-15 NOTE — Telephone Encounter (Signed)
Patient would like script sent to [facility]

## 2021-08-15 NOTE — Telephone Encounter (Signed)
Per Dr Lacinda Axon- Script sent to pharmacy- hold statin while on med

## 2021-08-15 NOTE — Telephone Encounter (Signed)
Left message to return call 

## 2021-08-15 NOTE — Telephone Encounter (Signed)
Patient called and stated he statred with cough and congestion on Friday and took a Covid test on Saturday and it was positive. Patient having no  SOB but didn't know if he needed to take the prescription med for Covid or just keep using [facility]  [phone]

## 2021-08-16 NOTE — Telephone Encounter (Signed)
Patient notified and stated he picked up med yesterday evening and feels like he is on the mend

## 2021-09-12 ENCOUNTER — Ambulatory Visit (INDEPENDENT_AMBULATORY_CARE_PROVIDER_SITE_OTHER): Payer: 59 | Admitting: Family Medicine

## 2021-09-12 DIAGNOSIS — H60392 Other infective otitis externa, left ear: Secondary | ICD-10-CM | POA: Insufficient documentation

## 2021-09-12 MED ORDER — DOXYCYCLINE HYCLATE 100 MG PO TABS: 100.0000 mg | ORAL_TABLET | Freq: Two times a day (BID) | ORAL | 0 refills | Status: DC

## 2021-09-12 MED ORDER — CIPROFLOXACIN HCL 500 MG PO TABS: 500.0000 mg | ORAL_TABLET | Freq: Two times a day (BID) | ORAL | 0 refills | Status: DC

## 2021-09-12 NOTE — Assessment & Plan Note (Signed)
Treating with doxycycline to cover for staph and strep as well as Cipro to cover for Pseudomonas.

## 2021-09-12 NOTE — Patient Instructions (Signed)
Antibiotics as prescribed.  If this continues to persist, please let me know.  Take care  Dr. Lacinda Axon

## 2021-09-12 NOTE — Progress Notes (Signed)
Subjective:  Patient ID: Michael [name], male    DOB: 1955-08-02  Age: 66 y.o. MRN: [id number]  CC: Chief Complaint  Patient presents with   left ear swelling     Noticed this weekend    dry, cracked, sore nostrils and recent eye problem   Hemorrhoids    Feels like a small cyst on rectum     HPI:  66 year old male presents for evaluation of the above.  Patient has had a myriad of symptoms recently.  These seem to have improved.  However, on Friday he noticed swelling of his left ear (external).  The area is red and visibly swollen.  No known injury or trauma that he is aware of.  No discharge from the ear.  No relieving factors.  Patient Active Problem List   Diagnosis Date Noted   Acute infection of external ear, left 09/12/2021   BPH (benign prostatic hyperplasia) 06/22/2021   Annual physical exam 06/22/2021   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   S/P carotid endarterectomy 06/03/2013   Gout 03/24/2012   Obesity (BMI 30-39.9) 12/06/2011   Coronary artery disease 12/15/2010   S/P coronary artery bypass graft x 4 12/15/2010   Hyperlipidemia 12/15/2008   Essential hypertension 12/15/2008    Social Hx   Social History   Socioeconomic History   Marital status: Divorced    Spouse name: Not on file   Number of children: Not on file   Years of education: Not on file   Highest education level: Not on file  Occupational History   Occupation: CONSTRUCTION/MAINT    Employer: [employer]    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 gam[employer]  Tobacco Use   Smoking status: Former    Packs/day: 1.00    Years: 20.00    Total pack years: 20.00    Types: Cigarettes    Quit date: 05/14/1996    Years since quitting: 25.3   Smokeless tobacco: Never   Tobacco comments:    quit 1995  Vaping Use   Vaping Use: Never used  Substance and Sexual Activity   Alcohol use: Yes    Alcohol/week: 6.0 standard drinks of alcohol    Types: 6 Cans of beer per week   Drug use: No   Sexual activity:  Never  Other Topics Concern   Not on file  Social History Narrative   Not on file   Social Determinants of Health   Financial Resource Strain: Not on file  Food Insecurity: Not on file  Transportation Needs: Not on file  Physical Activity: Not on file  Stress: Not on file  Social Connections: Not on file    Review of Systems Per HPI  Objective:  BP (!) 156/77   Pulse 75   Temp (!) 97.3 F (36.3 C)   Ht _0  (1.778 m)   Wt 216 lb (98 kg)   SpO2 96%   BMI 30.99 kg/m      09/12/2021    4:24 PM 09/12/2021    4:03 PM 06/22/2021    1:54 PM  BP/Weight  Systolic BP 601 093 235  Diastolic BP 77 80 80  Wt. (Lbs)  216   BMI  30.99 kg/m2     Physical Exam Constitutional:      General: He is not in acute distress.    Appearance: Normal appearance.  HENT:     Head: Normocephalic and atraumatic.     Left Ear: Tympanic membrane normal.     Ears:  Comments: Labeled location, the antitragus and surrounding area is erythematous and swollen.  He has some edema of the earlobe itself. Eyes:     General:        Right eye: No discharge.        Left eye: No discharge.     Conjunctiva/sclera: Conjunctivae normal.  Cardiovascular:     Rate and Rhythm: Normal rate and regular rhythm.  Pulmonary:     Effort: Pulmonary effort is normal. No respiratory distress.  Neurological:     Mental Status: He is alert.  Psychiatric:        Mood and Affect: Mood normal.        Behavior: Behavior normal.     Lab Results  Component Value Date   WBC 5.8 03/22/2020   HGB 13.6 03/22/2020   HCT 40.1 03/22/2020   PLT 170 03/22/2020   GLUCOSE 103 (H) 10/22/2020   CHOL 121 01/14/2021   TRIG 85 01/14/2021   HDL 47 01/14/2021   LDLCALC 57 01/14/2021   ALT 26 01/14/2021   AST 28 01/14/2021   NA 140 10/22/2020   K 4.2 10/22/2020   CL 99 10/22/2020   CREATININE 0.91 10/22/2020   BUN 12 10/22/2020   CO2 26 10/22/2020   TSH 1.150 11/01/2018   PSA 1.04 10/08/2013   INR 1.0 06/06/2013      Assessment & Plan:   Problem List Items Addressed This Visit       Nervous and Auditory   Acute infection of external ear, left    Treating with doxycycline to cover for staph and strep as well as Cipro to cover for Pseudomonas.       Meds ordered this encounter  Medications   ciprofloxacin (CIPRO) 500 MG tablet    Sig: Take 1 tablet (500 mg total) by mouth 2 (two) times daily.    Dispense:  14 tablet    Refill:  0   doxycycline (VIBRA-TABS) 100 MG tablet    Sig: Take 1 tablet (100 mg total) by mouth 2 (two) times daily.    Dispense:  14 tablet    Refill:  0    Follow-up: As previously directed or if fails to improve or worsens  [place]

## 2021-10-23 NOTE — Progress Notes (Unsigned)
Cardiology Office Note:    Date:  10/24/2021   ID:  Michael [name], DOB March 07, 1955, MRN [id number]  PCP:  Coral Spikes, DO  [facility] Cardiologist:  Freada Bergeron, MD Cardiology APP:  Sharmon Revere    Referring MD: Coral Spikes, DO   Chief Complaint:  F/u for CAD    Patient Profile: Coronary artery disease   S/p CABG in 1999 Cath in 2015: all grafts patent Myoview 03/2018: low risk  Carotid artery disease  S/p L CEA in 2014 Hypertension Hyperlipidemia GERD Alpha gal allergy Gout  Prior CV Studies:   GATED SPECT MYO PERF W/EXERCISE STRESS 1D 03/19/2018  Nuclear stress EF: 65%. The left ventricular ejection fraction is normal (55-65%).  This is a low risk study. There was 0.5 - 1.0 mm ST depression in the inferior and lateral leads but no evidence of ischemia on the scintographic images. No evidence of previous infarction   VAS US CAROTID DUPLEX BILATERAL 01/29/2018 RICA 1-39; L CEA patent   Cardiac catheterization 06/10/2013 LM ostial 30 LAD 100 LCx mid 95; OM2 100 RCA proximal 100 SVG-D2 patent SVG-OM2 patent with 30-40 mid graft RIMA-RCA patent, PDA occluded LIMA-LAD patent EF 55-60   Echocardiogram 01/04/12 Mild LVH, EF 60-65, no RWMA  History of Present Illness:   Michael [name] is a 66 y.o. male with the above problem list.  He was last seen in 10/2020. He returns for annual Cardiology f/u.  He is here alone.  He is doing well without chest pain, shortness of breath, syncope, orthopnea, leg edema.  He will likely retire in the next year.         Past Medical History:  Diagnosis Date   Allergy    Back pain    Bruises easily    CAD (coronary artery disease)    a. s/p CABG 1999. b. Cath 2015 patent grafts.   Carotid artery disease ([place])    a. s/p L CEA 2014, followed by [facility].   Essential hypertension    GERD (gastroesophageal reflux disease)    was on Prilosec;is not currently on any meds   Gout    takes Allopurinol  daily   Hyperglycemia    Hyperlipidemia    Malignant hyperthermia    Potential but not Confirmed   Neck pain    yrs ago and saw a chiropractor occasionally   Urinary urgency    Current Medications: Current Meds  Medication Sig   aspirin EC 81 MG tablet Take 1 tablet (81 mg total) by mouth daily.   carvedilol (COREG) 25 MG tablet Take 1 tablet (25 mg total) by mouth 2 (two) times daily with a meal.   cetirizine (ZYRTEC) 10 MG tablet Take 10 mg by mouth daily.   famotidine (PEPCID) 10 MG tablet Take 10 mg by mouth 2 (two) times daily.   Multiple Vitamin (MULTIVITAMIN) tablet Take 1 tablet by mouth daily.   [DISCONTINUED] amLODipine (NORVASC) 10 MG tablet Take 1 tablet (10 mg total) by mouth daily.   [DISCONTINUED] rosuvastatin (CRESTOR) 40 MG tablet Take 1 tablet (40 mg total) by mouth daily.    Allergies:   Anesthetics, amide; Ramipril; and Losartan   Social History   Tobacco Use   Smoking status: Former    Packs/day: 1.00    Years: 20.00    Total pack years: 20.00    Types: Cigarettes    Quit date: 05/14/1996    Years since quitting: 25.4  Smokeless tobacco: Never   Tobacco comments:    quit 1995  Vaping Use   Vaping Use: Never used  Substance Use Topics   Alcohol use: Yes    Alcohol/week: 6.0 standard drinks of alcohol    Types: 6 Cans of beer per week   Drug use: No    Family Hx: The patient's family history includes Arthritis in his mother; Breast cancer in his mother; CVA (age of onset: 31) in his brother; Hypertension in his brother, father, and mother; Kidney disease in his mother; Kidney failure in his mother; Ovarian cancer in his mother.  ROS   EKGs/Labs/Other Test Reviewed:    EKG:  EKG is  ordered today.  The ekg ordered today demonstrates NSR, normal axis, nonspecific ST-T wave changes, QTc 389, no change from prior tracing  Recent Labs: 01/14/2021: ALT 26   Recent Lipid Panel Recent Labs    01/14/21 0750  CHOL 121  TRIG 85  HDL 47  LDLCALC 57      Risk Assessment/Calculations/Metrics:              Physical Exam:    VS:  BP 128/70   Pulse 66   Ht $R'5\' 10"'cv$  (1.778 m)   Wt 216 lb 9.6 oz (98.2 kg)   SpO2 94%   BMI 31.08 kg/m     Wt Readings from Last 3 Encounters:  10/24/21 216 lb 9.6 oz (98.2 kg)  09/12/21 216 lb (98 kg)  06/22/21 215 lb (97.5 kg)    Constitutional:      Appearance: Healthy appearance. Not in distress.  Neck:     Vascular: JVD normal.  Pulmonary:     Effort: Pulmonary effort is normal.     Breath sounds: No wheezing. No rales.  Cardiovascular:     Normal rate. Regular rhythm. Normal S1. Normal S2.      Murmurs: There is no murmur.  Edema:    Peripheral edema absent.  Abdominal:     Palpations: Abdomen is soft.  Skin:    General: Skin is warm and dry.  Neurological:     General: No focal deficit present.     Mental Status: Alert and oriented to person, place and time.         ASSESSMENT & PLAN:   Coronary artery disease History of CABG in 1999.  Cardiac catheterization 2015 with patent bypass grafts.  Myoview in 2020 was low risk.  He is doing well without anginal symptoms.  Continue current medical regimen which includes aspirin 81 mg daily, carvedilol 25 mg twice daily, Crestor 40 mg daily.  Essential hypertension Blood pressure is well controlled.  Continue amlodipine 10 mg daily, carvedilol 25 mg twice daily.  Hyperlipidemia Lipids well controlled.  He is fasting today.  Update CMET, lipids.  Continue Crestor 40 mg daily.  Plan repeat labs again in 1 year prior to next office visit.  Carotid artery disease ([place]) History of left CEA in 2014.  He has not seen vascular surgery in 3 years.  We will contact their office to arrange follow-up.           Dispo:  Return in about 1 year (around 10/25/2022) for Routine Follow Up, w/ Dr. Johney Frame, or Richardson Dopp, PA-C.   Medication Adjustments/Labs and Tests Ordered: Current medicines are reviewed at length with the patient today.   Concerns regarding medicines are outlined above.  Tests Ordered: Orders Placed This Encounter  Procedures   Comp Met (CMET)   Comp Met (  CMET)   Lipid panel   Lipid panel   EKG 12-Lead   Medication Changes: Meds ordered this encounter  Medications   rosuvastatin (CRESTOR) 40 MG tablet    Sig: Take 1 tablet (40 mg total) by mouth daily.    Dispense:  90 tablet    Refill:  3   amLODipine (NORVASC) 10 MG tablet    Sig: Take 1 tablet (10 mg total) by mouth daily.    Dispense:  90 tablet    Refill:  3   Signed, Richardson Dopp, PA-C  10/24/2021 11:19 AM    [facility] [address], [place], [place]  [address] Phone: [phone]; Fax: [phone]

## 2021-10-24 ENCOUNTER — Encounter: Payer: Self-pay | Admitting: Physician Assistant

## 2021-10-24 ENCOUNTER — Ambulatory Visit: Payer: 59 | Attending: Physician Assistant | Admitting: Physician Assistant

## 2021-10-24 VITALS — BP 128/70 | HR 66 | Ht 70.0 in | Wt 216.6 lb

## 2021-10-24 DIAGNOSIS — I6523 Occlusion and stenosis of bilateral carotid arteries: Secondary | ICD-10-CM | POA: Diagnosis not present

## 2021-10-24 DIAGNOSIS — E782 Mixed hyperlipidemia: Secondary | ICD-10-CM

## 2021-10-24 DIAGNOSIS — I779 Disorder of arteries and arterioles, unspecified: Secondary | ICD-10-CM | POA: Insufficient documentation

## 2021-10-24 DIAGNOSIS — I2510 Atherosclerotic heart disease of native coronary artery without angina pectoris: Secondary | ICD-10-CM

## 2021-10-24 DIAGNOSIS — I10 Essential (primary) hypertension: Secondary | ICD-10-CM

## 2021-10-24 LAB — COMPREHENSIVE METABOLIC PANEL
ALT: 40 IU/L (ref 0–44)
AST: 43 IU/L — ABNORMAL HIGH (ref 0–40)
Albumin/Globulin Ratio: 2 (ref 1.2–2.2)
Albumin: 4.8 g/dL (ref 3.9–4.9)
Alkaline Phosphatase: 63 IU/L (ref 44–121)
BUN/Creatinine Ratio: 12 (ref 10–24)
BUN: 13 mg/dL (ref 8–27)
Bilirubin Total: 0.4 mg/dL (ref 0.0–1.2)
CO2: 28 mmol/L (ref 20–29)
Calcium: 9.7 mg/dL (ref 8.6–10.2)
Chloride: 101 mmol/L (ref 96–106)
Creatinine, Ser: 1.08 mg/dL (ref 0.76–1.27)
Globulin, Total: 2.4 g/dL (ref 1.5–4.5)
Glucose: 92 mg/dL (ref 70–99)
Potassium: 4.5 mmol/L (ref 3.5–5.2)
Sodium: 142 mmol/L (ref 134–144)
Total Protein: 7.2 g/dL (ref 6.0–8.5)
eGFR: 75 mL/min/{1.73_m2} (ref >59)

## 2021-10-24 LAB — LIPID PANEL
Chol/HDL Ratio: 2.1 ratio (ref 0.0–5.0)
Cholesterol, Total: 134 mg/dL (ref 100–199)
HDL: 64 mg/dL (ref >39)
LDL Chol Calc (NIH): 57 mg/dL (ref 0–99)
Triglycerides: 65 mg/dL (ref 0–149)
VLDL Cholesterol Cal: 13 mg/dL (ref 5–40)

## 2021-10-24 MED ORDER — CARVEDILOL 25 MG PO TABS
25.0000 mg | ORAL_TABLET | Freq: Two times a day (BID) | ORAL | 3 refills | Status: DC
Start: 2021-10-24 — End: 2022-05-04

## 2021-10-24 MED ORDER — AMLODIPINE BESYLATE 10 MG PO TABS
10.0000 mg | ORAL_TABLET | Freq: Every day | ORAL | 3 refills | Status: DC
Start: 2021-10-24 — End: 2022-05-04

## 2021-10-24 MED ORDER — ROSUVASTATIN CALCIUM 40 MG PO TABS: 40.0000 mg | ORAL_TABLET | Freq: Every day | ORAL | 3 refills | Status: DC

## 2021-10-24 NOTE — Assessment & Plan Note (Signed)
History of CABG in 1999.  Cardiac catheterization 2015 with patent bypass grafts.  Myoview in 2020 was low risk.  He is doing well without anginal symptoms.  Continue current medical regimen which includes aspirin 81 mg daily, carvedilol 25 mg twice daily, Crestor 40 mg daily.

## 2021-10-24 NOTE — Assessment & Plan Note (Signed)
Blood pressure is well controlled.  Continue amlodipine 10 mg daily, carvedilol 25 mg twice daily.

## 2021-10-24 NOTE — Assessment & Plan Note (Signed)
Lipids well controlled.  He is fasting today.  Update CMET, lipids.  Continue Crestor 40 mg daily.  Plan repeat labs again in 1 year prior to next office visit.

## 2021-10-24 NOTE — Patient Instructions (Signed)
Medication Instructions:  Your physician recommends that you continue on your current medications as directed. Please refer to the Current Medication list given to you today.  *If you need a refill on your cardiac medications before your next appointment, please call your pharmacy*   Lab Work: TODAY:  CMET & LIPI   1 YEAR BEFORE OFFICE VISIT:  FASTING LIPID & CMET  If you have labs (blood work) drawn today and your tests are completely normal, you will receive your results only by: [place] (if you have MyChart) OR A paper copy in the mail If you have any lab test that is abnormal or we need to change your treatment, we will call you to review the results.   Testing/Procedures: None ordered   Follow-Up: At [facility], you and your health needs are our priority.  As part of our continuing mission to provide you with exceptional heart care, we have created designated Provider Care Teams.  These Care Teams include your primary Cardiologist (physician) and Advanced Practice Providers (APPs -  Physician Assistants and Nurse Practitioners) who all work together to provide you with the care you need, when you need it.  We recommend signing up for the patient portal called "MyChart".  Sign up information is provided on this After Visit Summary.  MyChart is used to connect with patients for Virtual Visits (Telemedicine).  Patients are able to view lab/test results, encounter notes, upcoming appointments, etc.  Non-urgent messages can be sent to your provider as well.   To learn more about what you can do with MyChart, go to [email or web address].    Your next appointment:   1 year(s)  The format for your next appointment:   In Person  Provider:   Freada Bergeron, MD  or Richardson Dopp, PA-C         Other Instructions   Important Information About Sugar

## 2021-10-24 NOTE — Assessment & Plan Note (Signed)
History of left CEA in 2014.  He has not seen vascular surgery in 3 years.  We will contact their office to arrange follow-up.

## 2021-12-09 ENCOUNTER — Encounter: Payer: Self-pay | Admitting: Family Medicine

## 2021-12-09 ENCOUNTER — Ambulatory Visit (HOSPITAL_COMMUNITY)
Admission: RE | Admit: 2021-12-09 | Discharge: 2021-12-09 | Disposition: A | Discharge status: Home / Self Care | Payer: 59 | Source: Ambulatory Visit | Attending: Family Medicine | Admitting: Family Medicine

## 2021-12-09 ENCOUNTER — Ambulatory Visit (INDEPENDENT_AMBULATORY_CARE_PROVIDER_SITE_OTHER): Payer: 59 | Admitting: Family Medicine

## 2021-12-09 VITALS — BP 136/72 | Wt 217.8 lb

## 2021-12-09 DIAGNOSIS — M79642 Pain in left hand: Secondary | ICD-10-CM

## 2021-12-09 DIAGNOSIS — G8929 Other chronic pain: Secondary | ICD-10-CM | POA: Insufficient documentation

## 2021-12-09 MED ORDER — NAPROXEN 500 MG PO TABS: 500.0000 mg | ORAL_TABLET | Freq: Two times a day (BID) | ORAL | 0 refills | Status: DC | PRN

## 2021-12-09 NOTE — Assessment & Plan Note (Signed)
X-ray today for further evaluation.  Sparing use of naproxen as needed.

## 2021-12-09 NOTE — Patient Instructions (Signed)
X-ray at the hospital.  Use the medication sparingly.  Take care  Dr. Lacinda Axon

## 2021-12-09 NOTE — Progress Notes (Signed)
Subjective:  Patient ID: Michael [name], male    DOB: 1955-03-05  Age: 66 y.o. MRN: [id number]  CC: Chief Complaint  Patient presents with   Hand Pain    Left hand pain-has been going for a while but not consistent. No swelling. Pain always in same spot(on top of hand close to thumb) Pt states he is also having issues with back pain that began this past weekend-pain on right lower side     HPI:  66 year old male presents for evaluation of the above  Patient states that he has had intermittent left hand pain in a particular location for the past several months.  Is located on the dorsal aspect of his hand near the second metatarsal.  Sharp pain.  Pain was severe earlier this week but has now improved.  Patient also reports that for the past few days has had some right-sided low back pain.  He states that he has been on his feet a lot and working a lot of overtime.  Feels like he may have strained his back.  No relieving factors.  No other complaints.  Patient Active Problem List   Diagnosis Date Noted   Chronic hand pain, left 12/09/2021   Carotid artery disease ([place]) 10/24/2021   BPH (benign prostatic hyperplasia) 06/22/2021   Annual physical exam 06/22/2021   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   S/P carotid endarterectomy 06/03/2013   Gout 03/24/2012   Obesity (BMI 30-39.9) 12/06/2011   Coronary artery disease 12/15/2010   S/P coronary artery bypass graft x 4 12/15/2010   Hyperlipidemia 12/15/2008   Essential hypertension 12/15/2008    Social Hx   Social History   Socioeconomic History   Marital status: Divorced    Spouse name: Not on file   Number of children: Not on file   Years of education: Not on file   Highest education level: Not on file  Occupational History   Occupation: CONSTRUCTION/MAINT    Employer: [employer]    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 [employer]  Tobacco Use   Smoking status: Former    Packs/day: 1.00    Years: 20.00    Total pack years: 20.00     Types: Cigarettes    Quit date: 05/14/1996    Years since quitting: 25.5   Smokeless tobacco: Never   Tobacco comments:    quit 1995  Vaping Use   Vaping Use: Never used  Substance and Sexual Activity   Alcohol use: Yes    Alcohol/week: 6.0 standard drinks of alcohol    Types: 6 Cans of beer per week   Drug use: No   Sexual activity: Never  Other Topics Concern   Not on file  Social History Narrative   Not on file   Social Determinants of Health   Financial Resource Strain: Not on file  Food Insecurity: Not on file  Transportation Needs: Not on file  Physical Activity: Not on file  Stress: Not on file  Social Connections: Not on file    Review of Systems Per HPI  Objective:  BP 136/72   Wt 217 lb 12.8 oz (98.8 kg)   BMI 31.25 kg/m      12/09/2021    1:12 PM 10/24/2021   11:10 AM 10/24/2021   10:27 AM  BP/Weight  Systolic BP 151 761 607  Diastolic BP 72 70 64  Wt. (Lbs) 217.8  216.6  BMI 31.25 kg/m2  31.08 kg/m2    Physical Exam Constitutional:  General: He is not in acute distress.    Appearance: Normal appearance.  HENT:     Head: Normocephalic and atraumatic.  Cardiovascular:     Rate and Rhythm: Normal rate and regular rhythm.     Heart sounds: Murmur heard.  Pulmonary:     Effort: Pulmonary effort is normal.     Breath sounds: Normal breath sounds. No wheezing, rhonchi or rales.  Musculoskeletal:     Comments: Left hand -no appreciable swelling, erythema, or discrete tenderness to palpation.  Neurological:     Mental Status: He is alert.     Lab Results  Component Value Date   WBC 5.8 03/22/2020   HGB 13.6 03/22/2020   HCT 40.1 03/22/2020   PLT 170 03/22/2020   GLUCOSE 92 10/24/2021   CHOL 134 10/24/2021   TRIG 65 10/24/2021   HDL 64 10/24/2021   LDLCALC 57 10/24/2021   ALT 40 10/24/2021   AST 43 (H) 10/24/2021   NA 142 10/24/2021   K 4.5 10/24/2021   CL 101 10/24/2021   CREATININE 1.08 10/24/2021   BUN 13 10/24/2021   CO2  28 10/24/2021   TSH 1.150 11/01/2018   PSA 1.04 10/08/2013   INR 1.0 06/06/2013     Assessment & Plan:   Problem List Items Addressed This Visit       Other   Chronic hand pain, left - Primary    X-ray today for further evaluation.  Sparing use of naproxen as needed.      Relevant Medications   naproxen (NAPROSYN) 500 MG tablet   Other Relevant Orders   DG Hand Complete Left    Meds ordered this encounter  Medications   naproxen (NAPROSYN) 500 MG tablet    Sig: Take 1 tablet (500 mg total) by mouth 2 (two) times daily as needed for mild pain or moderate pain. Take with food.    Dispense:  30 tablet    Refill:  0    Follow-up:  Return if symptoms worsen or fail to improve.  [place]

## 2022-01-17 ENCOUNTER — Telehealth: Payer: Self-pay | Admitting: *Deleted

## 2022-01-17 NOTE — Telephone Encounter (Signed)
Patient states he tested positive for Covid Monday evening- started with little runny nose Saturday but didn't think much of it- using OTC meds do you think he needs antiviral med?   Larene Pickett

## 2022-01-17 NOTE — Telephone Encounter (Signed)
Thersa Salt G, DO     If he is minimally symptomatic, I think he can just continue with supportive care and avoid the antiviral. It is his choice. Based on his history, he qualifies for antiviral treatment if he so desires.

## 2022-01-17 NOTE — Telephone Encounter (Signed)
Patient notified and said his symptoms are mild- just runny nose and scratchy throat so he will hold on antiviral and let us know if anything changes

## 2022-04-19 DIAGNOSIS — I10 Essential (primary) hypertension: Secondary | ICD-10-CM | POA: Diagnosis not present

## 2022-04-19 DIAGNOSIS — I2510 Atherosclerotic heart disease of native coronary artery without angina pectoris: Secondary | ICD-10-CM | POA: Diagnosis not present

## 2022-04-19 DIAGNOSIS — M1990 Unspecified osteoarthritis, unspecified site: Secondary | ICD-10-CM | POA: Diagnosis not present

## 2022-04-19 DIAGNOSIS — E785 Hyperlipidemia, unspecified: Secondary | ICD-10-CM | POA: Diagnosis not present

## 2022-04-19 DIAGNOSIS — E669 Obesity, unspecified: Secondary | ICD-10-CM | POA: Diagnosis not present

## 2022-04-19 DIAGNOSIS — Z809 Family history of malignant neoplasm, unspecified: Secondary | ICD-10-CM | POA: Diagnosis not present

## 2022-04-19 DIAGNOSIS — Z8249 Family history of ischemic heart disease and other diseases of the circulatory system: Secondary | ICD-10-CM | POA: Diagnosis not present

## 2022-04-19 DIAGNOSIS — Z008 Encounter for other general examination: Secondary | ICD-10-CM | POA: Diagnosis not present

## 2022-04-19 DIAGNOSIS — Z87891 Personal history of nicotine dependence: Secondary | ICD-10-CM | POA: Diagnosis not present

## 2022-04-19 DIAGNOSIS — Z7982 Long term (current) use of aspirin: Secondary | ICD-10-CM | POA: Diagnosis not present

## 2022-04-19 DIAGNOSIS — I739 Peripheral vascular disease, unspecified: Secondary | ICD-10-CM | POA: Diagnosis not present

## 2022-04-19 DIAGNOSIS — Z6830 Body mass index (BMI) 30.0-30.9, adult: Secondary | ICD-10-CM | POA: Diagnosis not present

## 2022-04-19 DIAGNOSIS — Z823 Family history of stroke: Secondary | ICD-10-CM | POA: Diagnosis not present

## 2022-05-04 DIAGNOSIS — E782 Mixed hyperlipidemia: Secondary | ICD-10-CM

## 2022-05-04 MED ORDER — CARVEDILOL 25 MG PO TABS: 25.0000 mg | ORAL_TABLET | Freq: Two times a day (BID) | ORAL | 1 refills | Status: DC

## 2022-05-04 MED ORDER — AMLODIPINE BESYLATE 10 MG PO TABS: 10.0000 mg | ORAL_TABLET | Freq: Every day | ORAL | 1 refills | Status: DC

## 2022-05-04 MED ORDER — ROSUVASTATIN CALCIUM 40 MG PO TABS: 40.0000 mg | ORAL_TABLET | Freq: Every day | ORAL | 1 refills | Status: DC

## 2022-05-22 ENCOUNTER — Ambulatory Visit (INDEPENDENT_AMBULATORY_CARE_PROVIDER_SITE_OTHER): Payer: Medicare HMO | Admitting: Family Medicine

## 2022-05-22 ENCOUNTER — Encounter: Payer: Self-pay | Admitting: Family Medicine

## 2022-05-22 VITALS — BP 166/89 | HR 69 | Ht 70.0 in | Wt 216.0 lb

## 2022-05-22 DIAGNOSIS — K4090 Unilateral inguinal hernia, without obstruction or gangrene, not specified as recurrent: Secondary | ICD-10-CM | POA: Diagnosis not present

## 2022-05-22 NOTE — Patient Instructions (Signed)
Referral is in.  No heavy lifting.  Call with concerns.  Take care  Dr. Adriana Simas

## 2022-05-22 NOTE — Assessment & Plan Note (Signed)
Palpable left inguinal hernia.  Advised against heavy lifting.  Supportive care while awaiting surgical consultation.  Referral placed.

## 2022-05-22 NOTE — Progress Notes (Signed)
Subjective:  Patient ID: Michael [name], male    DOB: 28-Jun-1955  Age: 67 y.o. MRN: [id number]  CC: Chief Complaint  Patient presents with   Abdominal Pain    Patient states he has pressure and uncomfortableness in his lower abdominal     HPI:  67 year old male presents for evaluation of the above.  Patient reports a 4 to 5-week history of left groin discomfort.  He describes it as a pressure sensation.  He states that he has not appreciated a bulge.  He said prior inguinal hernia repair bilaterally.  No testicular pain.  No fever.  No constipation.  No other reported symptoms.  No other complaints or concerns at time.  Patient Active Problem List   Diagnosis Date Noted   Left inguinal hernia 05/22/2022   BPH (benign prostatic hyperplasia) 06/22/2021   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   S/P carotid endarterectomy 06/03/2013   Gout 03/24/2012   Obesity (BMI 30-39.9) 12/06/2011   Coronary artery disease 12/15/2010   S/P coronary artery bypass graft x 4 12/15/2010   Hyperlipidemia 12/15/2008   Essential hypertension 12/15/2008    Social Hx   Social History   Socioeconomic History   Marital status: Divorced    Spouse name: Not on file   Number of children: Not on file   Years of education: Not on file   Highest education level: 12th grade  Occupational History   Occupation: CONSTRUCTION/MAINT    Employer: [employer]    Comment: [employer]  Tobacco Use   Smoking status: Former    Packs/day: 1.00    Years: 20.00    Additional pack years: 0.00    Total pack years: 20.00    Types: Cigarettes    Quit date: 05/14/1996    Years since quitting: 26.0   Smokeless tobacco: Never   Tobacco comments:    quit 1995  Vaping Use   Vaping Use: Never used  Substance and Sexual Activity   Alcohol use: Yes    Alcohol/week: 6.0 standard drinks of alcohol    Types: 6 Cans of beer per week   Drug use: No   Sexual activity: Never  Other Topics Concern   Not on  file  Social History Narrative   Not on file   Social Determinants of Health   Financial Resource Strain: Low Risk  (05/18/2022)   Overall Financial Resource Strain (CARDIA)    Difficulty of Paying Living Expenses: Not hard at all  Food Insecurity: No Food Insecurity (05/18/2022)   Hunger Vital Sign    Worried About Running Out of Food in the Last Year: Never true    Ran Out of Food in the Last Year: Never true  Transportation Needs: No Transportation Needs (05/18/2022)   PRAPARE - Administrator, Civil Service (Medical): No    Lack of Transportation (Non-Medical): No  Physical Activity: Insufficiently Active (05/18/2022)   Exercise Vital Sign    Days of Exercise per Week: 3 days    Minutes of Exercise per Session: 20 min  Stress: No Stress Concern Present (05/18/2022)   [employer] of Occupational Health - Occupational Stress Questionnaire    Feeling of Stress : Only a little  Social Connections: Moderately Isolated (05/18/2022)   Social Connection and Isolation Panel [NHANES]    Frequency of Communication with Friends and Family: Never    Frequency of Social Gatherings with Friends and Family: Once a week    Attends Religious Services:  More than 4 times per year    Active Member of Clubs or Organizations: Yes    Attends Banker Meetings: More than 4 times per year    Marital Status: Divorced    Review of Systems Per HPI  Objective:  BP (!) 166/89 (BP Location: Right Arm, Patient Position: Sitting, Cuff Size: Large)   Pulse 69   Ht 5\' 10"  (1.778 m)   Wt 216 lb (98 kg)   SpO2 100%   BMI 30.99 kg/m      05/22/2022    3:52 PM 05/22/2022    3:46 PM 12/09/2021    1:12 PM  BP/Weight  Systolic BP 166 170 136  Diastolic BP 89 91 72  Wt. (Lbs)  216 217.8  BMI  30.99 kg/m2 31.25 kg/m2    Physical Exam Vitals and nursing note reviewed.  Constitutional:      General: He is not in acute distress.    Appearance: Normal appearance.  HENT:      Head: Normocephalic and atraumatic.  Pulmonary:     Effort: Pulmonary effort is normal. No respiratory distress.  Genitourinary:    Comments: Left inguinal hernia noted on examination. Neurological:     Mental Status: He is alert.  Psychiatric:        Mood and Affect: Mood normal.        Behavior: Behavior normal.     Lab Results  Component Value Date   WBC 5.8 03/22/2020   HGB 13.6 03/22/2020   HCT 40.1 03/22/2020   PLT 170 03/22/2020   GLUCOSE 92 10/24/2021   CHOL 134 10/24/2021   TRIG 65 10/24/2021   HDL 64 10/24/2021   LDLCALC 57 10/24/2021   ALT 40 10/24/2021   AST 43 (H) 10/24/2021   NA 142 10/24/2021   K 4.5 10/24/2021   CL 101 10/24/2021   CREATININE 1.08 10/24/2021   BUN 13 10/24/2021   CO2 28 10/24/2021   TSH 1.150 11/01/2018   PSA 1.04 10/08/2013   INR 1.0 06/06/2013     Assessment & Plan:   Problem List Items Addressed This Visit       Other   Left inguinal hernia - Primary    Palpable left inguinal hernia.  Advised against heavy lifting.  Supportive care while awaiting surgical consultation.  Referral placed.      Relevant Orders   Ambulatory referral to General Surgery   Follow-up:  Return if symptoms worsen or fail to improve.  Everlene Other DO [facility]

## 2022-06-22 DIAGNOSIS — K4091 Unilateral inguinal hernia, without obstruction or gangrene, recurrent: Secondary | ICD-10-CM | POA: Diagnosis not present

## 2022-06-30 ENCOUNTER — Ambulatory Visit (INDEPENDENT_AMBULATORY_CARE_PROVIDER_SITE_OTHER): Payer: Medicare HMO | Admitting: Family Medicine

## 2022-06-30 ENCOUNTER — Encounter: Payer: Self-pay | Admitting: Family Medicine

## 2022-06-30 VITALS — BP 121/73 | HR 67 | Temp 98.2°F | Ht 70.0 in | Wt 216.4 lb

## 2022-06-30 DIAGNOSIS — N5201 Erectile dysfunction due to arterial insufficiency: Secondary | ICD-10-CM

## 2022-06-30 DIAGNOSIS — R2231 Localized swelling, mass and lump, right upper limb: Secondary | ICD-10-CM | POA: Diagnosis not present

## 2022-06-30 DIAGNOSIS — N529 Male erectile dysfunction, unspecified: Secondary | ICD-10-CM | POA: Insufficient documentation

## 2022-06-30 MED ORDER — TADALAFIL 10 MG PO TABS: 10.0000 mg | ORAL_TABLET | Freq: Every day | ORAL | 0 refills | Status: DC | PRN

## 2022-06-30 NOTE — Patient Instructions (Signed)
Keep an eye on the area.  Medication sent in.  Take care  Dr. Adriana Simas

## 2022-06-30 NOTE — Assessment & Plan Note (Signed)
Clinically appears benign.  Advise close monitoring.  Offered referral to hand surgery.  He wants to wait at this time.

## 2022-06-30 NOTE — Progress Notes (Signed)
Subjective:  Patient ID: Michael [name], male    DOB: 1955/10/26  Age: 67 y.o. MRN: [id number]  CC: Chief Complaint  Patient presents with   Knot on hand    Knot on Right hand    HPI:  67 year old male with hypertension, coronary disease status post CABG, carotid artery disease status post carotid endarterectomy, obesity, BPH, hyperlipidemia presents for evaluation of the above.  Patient reports a palpable knot on the dorsum of his right hand at the level of the MCP joints.  He has noticed this recently.  He is not particular troublesome or painful.  He would like me to examine this today.  Patient also reports ongoing issues with erectile dysfunction.  He has a history of coronary disease but has been medically stable.  Had a low risk Myoview in 2020.  He would like to discuss treatment options.  Patient Active Problem List   Diagnosis Date Noted   Nodule of skin of right hand 06/30/2022   Erectile dysfunction 06/30/2022   Left inguinal hernia 05/22/2022   BPH (benign prostatic hyperplasia) 06/22/2021   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   S/P carotid endarterectomy 06/03/2013   Gout 03/24/2012   Obesity (BMI 30-39.9) 12/06/2011   Coronary artery disease 12/15/2010   S/P coronary artery bypass graft x 4 12/15/2010   Hyperlipidemia 12/15/2008   Essential hypertension 12/15/2008    Social Hx   Social History   Socioeconomic History   Marital status: Divorced    Spouse name: Not on file   Number of children: Not on file   Years of education: Not on file   Highest education level: 12th grade  Occupational History   Occupation: CONSTRUCTION/MAINT    Employer: [employer]    Comment: [employer]  Tobacco Use   Smoking status: Former    Packs/day: 1.00    Years: 20.00    Additional pack years: 0.00    Total pack years: 20.00    Types: Cigarettes    Quit date: 05/14/1996    Years since quitting: 26.1   Smokeless tobacco: Never   Tobacco comments:     quit 1995  Vaping Use   Vaping Use: Never used  Substance and Sexual Activity   Alcohol use: Yes    Alcohol/week: 6.0 standard drinks of alcohol    Types: 6 Cans of beer per week   Drug use: No   Sexual activity: Never  Other Topics Concern   Not on file  Social History Narrative   Not on file   Social Determinants of Health   Financial Resource Strain: Low Risk  (05/18/2022)   Overall Financial Resource Strain (CARDIA)    Difficulty of Paying Living Expenses: Not hard at all  Food Insecurity: No Food Insecurity (05/18/2022)   Hunger Vital Sign    Worried About Running Out of Food in the Last Year: Never true    Ran Out of Food in the Last Year: Never true  Transportation Needs: No Transportation Needs (05/18/2022)   PRAPARE - Administrator, Civil Service (Medical): No    Lack of Transportation (Non-Medical): No  Physical Activity: Insufficiently Active (05/18/2022)   Exercise Vital Sign    Days of Exercise per Week: 3 days    Minutes of Exercise per Session: 20 min  Stress: No Stress Concern Present (05/18/2022)   [employer] of Occupational Health - Occupational Stress Questionnaire    Feeling of Stress : Only a little  Social Connections:  Moderately Isolated (05/18/2022)   Social Connection and Isolation Panel [NHANES]    Frequency of Communication with Friends and Family: Never    Frequency of Social Gatherings with Friends and Family: Once a week    Attends Religious Services: More than 4 times per year    Active Member of [employer] or Organizations: Yes    Attends Engineer, structural: More than 4 times per year    Marital Status: Divorced    Review of Systems Per HPI  Objective:  BP 121/73   Pulse 67   Temp 98.2 F (36.8 C)   Ht 5\' 10"  (1.778 m)   Wt 216 lb 6.4 oz (98.2 kg)   SpO2 95%   BMI 31.05 kg/m      06/30/2022   10:49 AM 05/22/2022    3:52 PM 05/22/2022    3:46 PM  BP/Weight  Systolic BP 121 166 170  Diastolic BP 73 89 91   Wt. (Lbs) 216.4  216  BMI 31.05 kg/m2  30.99 kg/m2    Physical Exam Vitals and nursing note reviewed.  Constitutional:      General: He is not in acute distress.    Appearance: Normal appearance.  HENT:     Head: Normocephalic and atraumatic.  Pulmonary:     Effort: Pulmonary effort is normal. No respiratory distress.  Musculoskeletal:     Comments: Right hand with a palpable firm nodule which is easily movable on the dorsum of the right hand at the third MCP joint.  No surrounding erythema.  Neurological:     Mental Status: He is alert.  Psychiatric:        Mood and Affect: Mood normal.        Behavior: Behavior normal.     Lab Results  Component Value Date   WBC 5.8 03/22/2020   HGB 13.6 03/22/2020   HCT 40.1 03/22/2020   PLT 170 03/22/2020   GLUCOSE 92 10/24/2021   CHOL 134 10/24/2021   TRIG 65 10/24/2021   HDL 64 10/24/2021   LDLCALC 57 10/24/2021   ALT 40 10/24/2021   AST 43 (H) 10/24/2021   NA 142 10/24/2021   K 4.5 10/24/2021   CL 101 10/24/2021   CREATININE 1.08 10/24/2021   BUN 13 10/24/2021   CO2 28 10/24/2021   TSH 1.150 11/01/2018   PSA 1.04 10/08/2013   INR 1.0 06/06/2013     Assessment & Plan:   Problem List Items Addressed This Visit       Musculoskeletal and Integument   Nodule of skin of right hand - Primary    Clinically appears benign.  Advise close monitoring.  Offered referral to hand surgery.  He wants to wait at this time.        Other   Erectile dysfunction    As needed use of Cialis.       Meds ordered this encounter  Medications   tadalafil (CIALIS) 10 MG tablet    Sig: Take 1 tablet (10 mg total) by mouth daily as needed for erectile dysfunction.    Dispense:  10 tablet    Refill:  0    Seger Jani DO [facility]

## 2022-06-30 NOTE — Assessment & Plan Note (Signed)
As needed use of Cialis.

## 2022-07-14 ENCOUNTER — Ambulatory Visit (INDEPENDENT_AMBULATORY_CARE_PROVIDER_SITE_OTHER): Payer: Medicare HMO | Admitting: Family Medicine

## 2022-07-14 ENCOUNTER — Other Ambulatory Visit (HOSPITAL_COMMUNITY)
Admission: RE | Admit: 2022-07-14 | Discharge: 2022-07-14 | Disposition: A | Discharge status: Home / Self Care | Payer: Medicare HMO | Source: Ambulatory Visit | Attending: Family Medicine | Admitting: Family Medicine

## 2022-07-14 ENCOUNTER — Encounter: Payer: Self-pay | Admitting: Family Medicine

## 2022-07-14 VITALS — BP 122/78 | HR 68 | Wt 213.0 lb

## 2022-07-14 DIAGNOSIS — R600 Localized edema: Secondary | ICD-10-CM

## 2022-07-14 DIAGNOSIS — I2510 Atherosclerotic heart disease of native coronary artery without angina pectoris: Secondary | ICD-10-CM | POA: Diagnosis not present

## 2022-07-14 DIAGNOSIS — I10 Essential (primary) hypertension: Secondary | ICD-10-CM

## 2022-07-14 LAB — BRAIN NATRIURETIC PEPTIDE: B Natriuretic Peptide: 111 pg/mL — ABNORMAL HIGH (ref 0.0–100.0)

## 2022-07-14 LAB — HEPATIC FUNCTION PANEL
ALT: 36 U/L (ref 0–44)
AST: 37 U/L (ref 15–41)
Albumin: 4.3 g/dL (ref 3.5–5.0)
Alkaline Phosphatase: 61 U/L (ref 38–126)
Bilirubin, Direct: 0.1 mg/dL (ref 0.0–0.2)
Indirect Bilirubin: 0.7 mg/dL (ref 0.3–0.9)
Total Bilirubin: 0.8 mg/dL (ref 0.3–1.2)
Total Protein: 8 g/dL (ref 6.5–8.1)

## 2022-07-14 LAB — BASIC METABOLIC PANEL
Anion gap: 9 (ref 5–15)
BUN: 13 mg/dL (ref 8–23)
CO2: 27 mmol/L (ref 22–32)
Calcium: 9.4 mg/dL (ref 8.9–10.3)
Chloride: 101 mmol/L (ref 98–111)
Creatinine, Ser: 0.97 mg/dL (ref 0.61–1.24)
GFR, Estimated: >60 mL/min (ref >60)
Glucose, Bld: 103 mg/dL — ABNORMAL HIGH (ref 70–99)
Potassium: 3.8 mmol/L (ref 3.5–5.1)
Sodium: 137 mmol/L (ref 135–145)

## 2022-07-14 LAB — D-DIMER, QUANTITATIVE: D-Dimer, Quant: 0.46 ug/mL-FEU (ref 0.00–0.50)

## 2022-07-14 MED ORDER — FUROSEMIDE 20 MG PO TABS: ORAL_TABLET | ORAL | 0 refills | Status: DC

## 2022-07-14 MED ORDER — POTASSIUM CHLORIDE CRYS ER 20 MEQ PO TBCR: EXTENDED_RELEASE_TABLET | ORAL | 0 refills | Status: DC

## 2022-07-14 NOTE — Progress Notes (Signed)
   Subjective:    Patient ID: Michael [name], male    DOB: 1955-08-14, 67 y.o.   MRN: [id number]  HPI Patient arrives today for swollen left ankle. Patient states it is warm sensation. Patient denies pain.  Patient with underlying heart disease Patient denies calf pain Walks on a regular basis Denies shortness of breath chest pressure or difficulty breathing No PND No orthopnea   Review of Systems     Objective:   Physical Exam General-in no acute distress Eyes-no discharge Lungs-respiratory rate normal, CTA CV-no murmurs,RRR Extremities skin warm dry lower leg edema worse on the left than the right edema Neuro grossly normal Behavior normal, alert  calf nontender bilateral circumference equal bilateral He does have some changes of venous stasis       Assessment & Plan:  1. Essential hypertension Check blood pressure on a regular basis Blood pressure today is good Lab work ordered - Basic Metabolic Panel - Hepatic function panel - D-dimer, quantitative - Brain natriuretic peptide  2. Coronary artery disease involving native coronary artery of native heart without angina pectoris Doubt heart failure but check BNP if elevated may need some adjustments in regards to medication possible echo possible referral back to cardiology - Basic Metabolic Panel - Hepatic function panel - D-dimer, quantitative - Brain natriuretic peptide  3. Pedal edema Could be related to hot weather as well as Norvasc could also be a sign of retaining extra fluid await labs - Basic Metabolic Panel - Hepatic function panel - D-dimer, quantitative - Brain natriuretic peptide

## 2022-07-16 ENCOUNTER — Telehealth: Payer: Self-pay | Admitting: Physician Assistant

## 2022-07-16 NOTE — Telephone Encounter (Signed)
Triage Please arrange follow up appt this week with Dr. Shari Prows, me or another APP. I am off next week and have no slots this week. Let me know if there are no appts and I can see where I can add him in. Tereso Newcomer, PA-C    07/16/2022 10:19 PM

## 2022-07-16 NOTE — Telephone Encounter (Signed)
-----   Message from [place] sent at 07/14/2022  4:03 PM EDT ----- Patient recently seen today with pedal edema.  More than likely related to his amlodipine and increase heat but his BNP was slightly elevated.  I chose to treat him with Lasix as needed.  Please see if your office can see him for follow-up within the next 2 weeks.  I told him that you all may well consider doing an up-to-date echo.  Please let me know if any questions concerns or issues thank you-[facility]

## 2022-07-17 ENCOUNTER — Ambulatory Visit: Payer: Medicare HMO | Admitting: Family Medicine

## 2022-07-17 NOTE — Telephone Encounter (Signed)
Scheduled appointment and called to make Pt aware.

## 2022-07-17 NOTE — Telephone Encounter (Signed)
Add on at 9:40 am on Friday 7/19. Tereso Newcomer, PA-C    07/17/2022 12:11 PM

## 2022-07-19 DIAGNOSIS — R609 Edema, unspecified: Secondary | ICD-10-CM | POA: Insufficient documentation

## 2022-07-19 DIAGNOSIS — H524 Presbyopia: Secondary | ICD-10-CM | POA: Diagnosis not present

## 2022-07-19 DIAGNOSIS — R600 Localized edema: Secondary | ICD-10-CM | POA: Insufficient documentation

## 2022-07-19 DIAGNOSIS — H52223 Regular astigmatism, bilateral: Secondary | ICD-10-CM | POA: Diagnosis not present

## 2022-07-19 DIAGNOSIS — H5203 Hypermetropia, bilateral: Secondary | ICD-10-CM | POA: Diagnosis not present

## 2022-07-19 NOTE — Progress Notes (Addendum)
Cardiology Office Note:    Date:  07/21/2022  ID:  Michael [name], DOB 17-Dec-1955, MRN [id number] PCP: Tommie Sams, DO  [facility] Providers Cardiologist:  Meriam Sprague, MD Cardiology APP:  Beatrice Lecher, PA-C       Patient Profile:      Coronary artery disease  S/p CABG in 1999 TTE 01/04/2012: Mild LVH, EF 60-65, no RWMA  LHC 06/10/2013: all grafts patent (SVG-D2, SVG-OM 2, RIMA-RCA, LIMA-LAD) Myoview 03/2018: low risk  Carotid artery disease  S/p L CEA in 2014 Carotid US 01/21/2018: R ICA 1-39, L CEA patent Hypertension Hyperlipidemia GERD Alpha gal allergy Gout            Discussed the use of AI scribe software for clinical note transcription with the patient, who gave verbal consent to proceed.  History of Present Illness   A 67 year old male with a history of CAD status post CABG, hypertension, and hyperlipidemia presents for a follow-up visit concerning lower extremity edema and elevated BNP. The patient was last seen in October 2023. The patient sought primary care on July 12th due to swelling in the left foot and ankle. The patient reports that the swelling began a day before the visit and was primarily in the left foot and ankle, with slight swelling also noted in the right foot. The patient denies any associated pain but reports a warm sensation in the swollen areas. The patient reports that the swelling subsides overnight but returns upon becoming active during the day. The patient denies any recent long car rides or plane trips. The patient has been taking Lasix 20 mg daily since July 12th and reports improvement in the swelling. The patient denies any shortness of breath and has recently started a walking regimen of about a mile and a half daily without any difficulty. The patient reports no chest pain or other symptoms that would suggest angina. He has not had orthopnea, syncope.     ROS:  See HPI    Studies Reviewed:        Risk  Assessment/Calculations:             Physical Exam:   VS:  BP 138/80   Pulse 78   Ht 5\' 9"  (1.753 m)   Wt 208 lb (94.3 kg)   SpO2 98%   BMI 30.72 kg/m    Wt Readings from Last 3 Encounters:  07/21/22 208 lb (94.3 kg)  07/14/22 213 lb (96.6 kg)  06/30/22 216 lb 6.4 oz (98.2 kg)    GEN: Well nourished, well developed in no acute distress NECK: No JVD CARDIAC: RRR, no murmurs, rubs, gallops RESPIRATORY:  Clear to auscultation without rales, wheezing or rhonchi  ABDOMEN: Soft EXTREMITIES:  very trace bilat ankle edema     Assessment and Plan:  Lower extremity edema L>>R lower extremity edema with mild elevation in BNP. No shortness of breath or other symptoms of heart failure. Edema improved with Lasix 20mg  daily. At this point, I am not convinced he has CHF. I am not sure what to make of the elevated BNP -Continue Lasix 20mg  daily and Potassium 20 mEq daily. -Order echocardiogram to evaluate for left ventricular dysfunction, diastolic dysfunction, and valvular heart disease. -Check follow-up Basic Metabolic Panel (BMP) today.  Coronary artery disease Asymptomatic, status post CABG in 1999 with patent grafts on catheterization in 2015 and low-risk stress test in 2020. -Continue Aspirin 81mg  daily and Crestor 40mg  daily.  Essential hypertension Well controlled on current  regimen. -Continue Amlodipine 10mg  daily and Carvedilol 25mg  twice daily, Lasix 20 mg daily.  Hyperlipidemia LDL optimal at 57 in October 2023. -Continue Crestor 40mg  daily.    Dispo:  Return in about 3 months (around 10/21/2022) for Routine Follow Up, w/ Tereso Newcomer, PA-C.  Signed, Tereso Newcomer, PA-C

## 2022-07-21 ENCOUNTER — Ambulatory Visit: Payer: Medicare HMO | Attending: Physician Assistant | Admitting: Physician Assistant

## 2022-07-21 ENCOUNTER — Encounter: Payer: Self-pay | Admitting: Physician Assistant

## 2022-07-21 VITALS — BP 138/80 | HR 78 | Ht 69.0 in | Wt 208.0 lb

## 2022-07-21 DIAGNOSIS — I2510 Atherosclerotic heart disease of native coronary artery without angina pectoris: Secondary | ICD-10-CM | POA: Diagnosis not present

## 2022-07-21 DIAGNOSIS — I10 Essential (primary) hypertension: Secondary | ICD-10-CM

## 2022-07-21 DIAGNOSIS — E782 Mixed hyperlipidemia: Secondary | ICD-10-CM

## 2022-07-21 DIAGNOSIS — R609 Edema, unspecified: Secondary | ICD-10-CM | POA: Diagnosis not present

## 2022-07-21 DIAGNOSIS — R600 Localized edema: Secondary | ICD-10-CM

## 2022-07-21 DIAGNOSIS — R0602 Shortness of breath: Secondary | ICD-10-CM | POA: Diagnosis not present

## 2022-07-21 NOTE — Patient Instructions (Signed)
Medication Instructions:  Your physician recommends that you continue on your current medications as directed. Please refer to the Current Medication list given to you today.   *If you need a refill on your cardiac medications before your next appointment, please call your pharmacy*   Lab Work: TODAY:  BMET  If you have labs (blood work) drawn today and your tests are completely normal, you will receive your results only by: MyChart Message (if you have MyChart) OR A paper copy in the mail If you have any lab test that is abnormal or we need to change your treatment, we will call you to review the results.   Testing/Procedures: Your physician has requested that you have an echocardiogram. Echocardiography is a painless test that uses sound waves to create images of your heart. It provides your doctor with information about the size and shape of your heart and how well your heart's chambers and valves are working. This procedure takes approximately one hour. There are no restrictions for this procedure. Please do NOT wear cologne, perfume, aftershave, or lotions (deodorant is allowed). Please arrive 15 minutes prior to your appointment time.    Follow-Up: At [facility], you and your health needs are our priority.  As part of our continuing mission to provide you with exceptional heart care, we have created designated Provider Care Teams.  These Care Teams include your primary Cardiologist (physician) and Advanced Practice Providers (APPs -  Physician Assistants and Nurse Practitioners) who all work together to provide you with the care you need, when you need it.  We recommend signing up for the patient portal called "MyChart".  Sign up information is provided on this After Visit Summary.  MyChart is used to connect with patients for Virtual Visits (Telemedicine).  Patients are able to view lab/test results, encounter notes, upcoming appointments, etc.  Non-urgent messages can be  sent to your provider as well.   To learn more about what you can do with MyChart, go to [email or web address].    Your next appointment:   2-3 month(s)  Provider:   Tereso Newcomer, PA-C         Other Instructions

## 2022-07-21 NOTE — Assessment & Plan Note (Signed)
Well controlled on current regimen. -Continue Amlodipine 10mg  daily and Carvedilol 25mg  twice daily, Lasix 20 mg daily.

## 2022-07-21 NOTE — Assessment & Plan Note (Signed)
L>>R lower extremity edema with mild elevation in BNP. No shortness of breath or other symptoms of heart failure. Edema improved with Lasix 20mg  daily. At this point, I am not convinced he has CHF. I am not sure what to make of the elevated BNP -Continue Lasix 20mg  daily and Potassium 20 mEq daily. -Order echocardiogram to evaluate for left ventricular dysfunction, diastolic dysfunction, and valvular heart disease. -Check follow-up Basic Metabolic Panel (BMP) today.

## 2022-07-21 NOTE — Assessment & Plan Note (Signed)
LDL optimal at 57 in October 2023. -Continue Crestor 40mg  daily.

## 2022-07-21 NOTE — Assessment & Plan Note (Signed)
Asymptomatic, status post CABG in 1999 with patent grafts on catheterization in 2015 and low-risk stress test in 2020. -Continue Aspirin 81mg  daily and Crestor 40mg  daily.

## 2022-07-22 LAB — BASIC METABOLIC PANEL
BUN/Creatinine Ratio: 13 (ref 10–24)
BUN: 15 mg/dL (ref 8–27)
CO2: 28 mmol/L (ref 20–29)
Calcium: 9.6 mg/dL (ref 8.6–10.2)
Chloride: 94 mmol/L — ABNORMAL LOW (ref 96–106)
Creatinine, Ser: 1.18 mg/dL (ref 0.76–1.27)
Glucose: 120 mg/dL — ABNORMAL HIGH (ref 70–99)
Potassium: 4.5 mmol/L (ref 3.5–5.2)
Sodium: 138 mmol/L (ref 134–144)
eGFR: 68 mL/min/{1.73_m2} (ref >59)

## 2022-07-26 ENCOUNTER — Ambulatory Visit: Payer: Medicare HMO | Admitting: Family Medicine

## 2022-07-27 ENCOUNTER — Encounter: Payer: Self-pay | Admitting: Family Medicine

## 2022-07-27 ENCOUNTER — Ambulatory Visit (INDEPENDENT_AMBULATORY_CARE_PROVIDER_SITE_OTHER): Payer: Medicare HMO | Admitting: Family Medicine

## 2022-07-27 VITALS — BP 129/75 | HR 64 | Temp 97.9°F | Ht 69.0 in | Wt 207.0 lb

## 2022-07-27 DIAGNOSIS — I2510 Atherosclerotic heart disease of native coronary artery without angina pectoris: Secondary | ICD-10-CM

## 2022-07-27 DIAGNOSIS — R600 Localized edema: Secondary | ICD-10-CM | POA: Diagnosis not present

## 2022-07-27 DIAGNOSIS — I10 Essential (primary) hypertension: Secondary | ICD-10-CM

## 2022-07-27 DIAGNOSIS — R7309 Other abnormal glucose: Secondary | ICD-10-CM | POA: Diagnosis not present

## 2022-07-27 NOTE — Patient Instructions (Signed)
Lab today  Follow up in 3 months

## 2022-07-29 NOTE — Progress Notes (Unsigned)
Subjective:  Patient ID: Michael [name], male    DOB: 06-19-1955  Age: 67 y.o. MRN: [id number]  CC: Chief Complaint  Patient presents with   Hypertension    HPI:  67 year old male with coronary artery disease status post CABG, peripheral artery disease status post carotid endarterectomy, hypertension, BPH, hyperlipidemia, gout presents for follow-up.  Patient recently seen while I was on vacation.  He presented with lower extremity edema.  Was started on Lasix and referred back to cardiology.  Given his cardiac history, echo has been scheduled.  Mildly elevated BNP.  Patient reports that his edema has resolved.  He is doing well.  Denies chest pain or shortness of breath.  Patient would like to stop Lasix.  Will discuss this today.  Blood pressure well-controlled on amlodipine and carvedilol.  Patient Active Problem List   Diagnosis Date Noted   Lower extremity edema 07/19/2022   Nodule of skin of right hand 06/30/2022   Erectile dysfunction 06/30/2022   Left inguinal hernia 05/22/2022   BPH (benign prostatic hyperplasia) 06/22/2021   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   S/P carotid endarterectomy 06/03/2013   Gout 03/24/2012   Obesity (BMI 30-39.9) 12/06/2011   Coronary artery disease 12/15/2010   S/P coronary artery bypass graft x 4 12/15/2010   Hyperlipidemia 12/15/2008   Essential hypertension 12/15/2008    Social Hx   Social History   Socioeconomic History   Marital status: Divorced    Spouse name: Not on file   Number of children: Not on file   Years of education: Not on file   Highest education level: 12th grade  Occupational History   Occupation: CONSTRUCTION/MAINT    Employer: [employer]    Comment: [employer]  Tobacco Use   Smoking status: Former    Current packs/day: 0.00    Average packs/day: 1 pack/day for 20.0 years (20.0 ttl pk-yrs)    Types: Cigarettes    Start date: 05/14/1976    Quit date: 05/14/1996    Years since quitting:  26.2   Smokeless tobacco: Never   Tobacco comments:    quit 1995  Vaping Use   Vaping status: Never Used  Substance and Sexual Activity   Alcohol use: Yes    Alcohol/week: 6.0 standard drinks of alcohol    Types: 6 Cans of beer per week   Drug use: No   Sexual activity: Never  Other Topics Concern   Not on file  Social History Narrative   Not on file   Social Determinants of Health   Financial Resource Strain: Low Risk  (05/18/2022)   Overall Financial Resource Strain (CARDIA)    Difficulty of Paying Living Expenses: Not hard at all  Food Insecurity: No Food Insecurity (05/18/2022)   Hunger Vital Sign    Worried About Running Out of Food in the Last Year: Never true    Ran Out of Food in the Last Year: Never true  Transportation Needs: No Transportation Needs (05/18/2022)   PRAPARE - Administrator, Civil Service (Medical): No    Lack of Transportation (Non-Medical): No  Physical Activity: Insufficiently Active (05/18/2022)   Exercise Vital Sign    Days of Exercise per Week: 3 days    Minutes of Exercise per Session: 20 min  Stress: No Stress Concern Present (05/18/2022)   [employer] of Occupational Health - Occupational Stress Questionnaire    Feeling of Stress : Only a little  Social Connections: Moderately Isolated (05/18/2022)  Social Advertising account executive [NHANES]    Frequency of Communication with Friends and Family: Never    Frequency of Social Gatherings with Friends and Family: Once a week    Attends Religious Services: More than 4 times per year    Active Member of [employer] or Organizations: Yes    Attends Engineer, structural: More than 4 times per year    Marital Status: Divorced    Review of Systems Per HPI  Objective:  BP 129/75   Pulse 64   Temp 97.9 F (36.6 C)   Ht 5\' 9"  (1.753 m)   Wt 207 lb (93.9 kg)   SpO2 97%   BMI 30.57 kg/m      07/27/2022   11:08 AM 07/21/2022    9:55 AM 07/14/2022   12:35 PM  BP/Weight   Systolic BP 129 138 122  Diastolic BP 75 80 78  Wt. (Lbs) 207 208   BMI 30.57 kg/m2 30.72 kg/m2     Physical Exam  Lab Results  Component Value Date   WBC 5.8 03/22/2020   HGB 13.6 03/22/2020   HCT 40.1 03/22/2020   PLT 170 03/22/2020   GLUCOSE 120 (H) 07/21/2022   CHOL 134 10/24/2021   TRIG 65 10/24/2021   HDL 64 10/24/2021   LDLCALC 57 10/24/2021   ALT 36 07/14/2022   AST 37 07/14/2022   NA 138 07/21/2022   K 4.5 07/21/2022   CL 94 (L) 07/21/2022   CREATININE 1.18 07/21/2022   BUN 15 07/21/2022   CO2 28 07/21/2022   TSH 1.150 11/01/2018   PSA 1.04 10/08/2013   INR 1.0 06/06/2013   HGBA1C 5.8 (H) 07/27/2022     Assessment & Plan:   Problem List Items Addressed This Visit   None Visit Diagnoses     Elevated random blood glucose level    -  Primary   Relevant Orders   Hemoglobin A1c (Completed)       No orders of the defined types were placed in this encounter.   Follow-up:  Return in about 3 months (around 10/27/2022).  Everlene Other DO [facility]

## 2022-07-30 NOTE — Assessment & Plan Note (Signed)
Resolved. Discontinuing Lasix.

## 2022-07-30 NOTE — Assessment & Plan Note (Signed)
Well controlled. Continue Amlodipine and Carvedilol. Discontinuing Lasix.

## 2022-07-30 NOTE — Assessment & Plan Note (Signed)
Stable. Asymptomatic.

## 2022-08-14 ENCOUNTER — Encounter: Payer: Self-pay | Admitting: Physician Assistant

## 2022-08-14 ENCOUNTER — Ambulatory Visit (HOSPITAL_COMMUNITY): Payer: Medicare HMO | Attending: Cardiovascular Disease

## 2022-08-14 DIAGNOSIS — I2510 Atherosclerotic heart disease of native coronary artery without angina pectoris: Secondary | ICD-10-CM | POA: Diagnosis not present

## 2022-08-14 DIAGNOSIS — R609 Edema, unspecified: Secondary | ICD-10-CM | POA: Diagnosis not present

## 2022-08-14 DIAGNOSIS — R0602 Shortness of breath: Secondary | ICD-10-CM | POA: Diagnosis not present

## 2022-08-14 DIAGNOSIS — I10 Essential (primary) hypertension: Secondary | ICD-10-CM | POA: Insufficient documentation

## 2022-08-14 DIAGNOSIS — I5189 Other ill-defined heart diseases: Secondary | ICD-10-CM

## 2022-08-14 HISTORY — DX: Other ill-defined heart diseases: I51.89

## 2022-08-14 LAB — ECHOCARDIOGRAM COMPLETE
Area-P 1/2: 4.06 cm2
Calc EF: 65.8 %
S' Lateral: 3.3 cm
Single Plane A2C EF: 64.5 %
Single Plane A4C EF: 67.2 %

## 2022-08-14 MED ORDER — PERFLUTREN LIPID MICROSPHERE
1.0000 mL | INTRAVENOUS | Status: AC | PRN
Start: 2022-08-14 — End: 2022-08-14
  Administered 2022-08-14: 2 mL via INTRAVENOUS

## 2022-10-17 ENCOUNTER — Other Ambulatory Visit: Payer: Self-pay

## 2022-10-17 DIAGNOSIS — E782 Mixed hyperlipidemia: Secondary | ICD-10-CM

## 2022-10-17 MED ORDER — CARVEDILOL 25 MG PO TABS
25.0000 mg | ORAL_TABLET | Freq: Two times a day (BID) | ORAL | 1 refills | Status: DC
Start: 2022-10-17 — End: 2023-05-07

## 2022-10-17 MED ORDER — AMLODIPINE BESYLATE 10 MG PO TABS: 10.0000 mg | ORAL_TABLET | Freq: Every day | ORAL | 1 refills | Status: DC

## 2022-10-17 MED ORDER — ROSUVASTATIN CALCIUM 40 MG PO TABS: 40.0000 mg | ORAL_TABLET | Freq: Every day | ORAL | 1 refills | Status: DC

## 2022-10-23 NOTE — Progress Notes (Unsigned)
Cardiology Office Note:    Date:  10/24/2022  ID:  Michael [name], DOB [birth year], MRN [id number] PCP: Tommie Sams, DO  [facility] Providers Cardiologist:  Meriam Sprague, MD (Inactive) Cardiology APP:  Beatrice Lecher, PA-C       Patient Profile:      Coronary artery disease  S/p CABG in 1999 TTE 01/04/2012: Mild LVH, EF 60-65, no RWMA  LHC 06/10/2013: all grafts patent (SVG-D2, SVG-OM 2, RIMA-RCA, LIMA-LAD) Myoview 03/2018: low risk  TTE 08/14/2022: EF 60-65, no RWMA, GR 2 DD, normal RVSF, normal PASP, trivial MR, trivial AI, AV sclerosis, RAP 3 Carotid artery disease  S/p L CEA in 2014 Carotid US 01/21/2018: R ICA 1-39, L CEA patent Hypertension Hyperlipidemia GERD Alpha gal allergy Gout          History of Present Illness:  Discussed the use of AI scribe software for clinical note transcription with the patient, who gave verbal consent to proceed.  Michael [name] is a 67 y.o. male who returns for follow up of CAD, leg edema. He was last seen 07/21/22 for mildly elevated BNP (111) in the setting of leg edema. He had no symptoms of congestive heart failure. His edema improved with Lasix. I continued this and arranged a follow up echocardiogram which demonstrated EF 60-65, mod diastolic dysfunction. He is here alone. His leg edema has resolved. The patient has been maintaining a regular exercise routine, including walking and light gym exercises, which has resulted in significant weight loss. The patient has not experienced any recent swelling, shortness of breath, chest pain, or passing out. There have been no bleeding issues or black stools.     ROS   See HPI     Studies Reviewed:   EKG Interpretation Date/Time:  Tuesday October 24 2022 07:56:57 EDT Ventricular Rate:  59 PR Interval:  202 QRS Duration:  92 QT Interval:  410 QTC Calculation: 405 R Axis:   14  Text Interpretation: Sinus bradycardia Non-specific ST-t changes No significant change since last tracing  Confirmed by Tereso Newcomer [phone]) on 10/24/2022 8:04:17 AM   Results   LABS - Chart Review Potassium: 4.5 (07/21/2022) Creatinine: 1.18 (07/21/2022) ALT: 36 (07/14/2022) LDL: 57 (10/24/2021) Triglyceride: 65 (10/24/2021) HDL: 64 (10/24/2021) Total Cholesterol: 134 (10/24/2021)      Risk Assessment/Calculations:             Physical Exam:   VS:  BP (!) 116/58   Pulse (!) 59   Ht 5\' 10"  (1.778 m)   Wt 191 lb 3.2 oz (86.7 kg)   SpO2 93%   BMI 27.43 kg/m    Wt Readings from Last 3 Encounters:  10/24/22 191 lb 3.2 oz (86.7 kg)  07/27/22 207 lb (93.9 kg)  07/21/22 208 lb (94.3 kg)    Constitutional:      Appearance: Healthy appearance. Not in distress.  Neck:     Vascular: JVD normal.  Pulmonary:     Breath sounds: Normal breath sounds. No wheezing. No rales.  Cardiovascular:     Normal rate. Regular rhythm.     Murmurs: There is no murmur.  Edema:    Peripheral edema absent.  Abdominal:     Palpations: Abdomen is soft.        Assessment and Plan:   Assessment & Plan Coronary artery disease involving native coronary artery of native heart without angina pectoris Status post CABG in 1999 with all grafts patent by cardiac catheterization in 2015. Myoview in  2020 was low risk. He is not having chest symptoms suggestive of angina. -Continue Aspirin 81mg  daily and Crestor 40mg  daily. -Follow up in 1 year. Grade II diastolic dysfunction Grade 2 diastolic dysfunction on echocardiogram. No current symptoms of heart failure. AHA Stage B.  -Continue current management. -Advise patient to monitor weight and consider use of Lasix if sudden weight gain (> 3 lbs in 1 day) or swelling occur.  Essential hypertension Blood pressure well controlled. -Continue Amlodipine 10mg  daily and Carvedilol 25mg  twice daily. -CMET today Mixed hyperlipidemia LDL slightly above target at 57 (goal <55). -Continue Crestor 40mg  daily. -Check lipid panel, CMET today. Bilateral carotid artery  stenosis Status post left CEA in 2014. Last carotid ultrasound in January 2020. -Arrange repeat carotid ultrasound.       Dispo:  Return in about 1 year (around 10/24/2023) for Routine Follow Up, w/ Tereso Newcomer, PA-C.  Signed, Tereso Newcomer, PA-C

## 2022-10-24 ENCOUNTER — Ambulatory Visit (HOSPITAL_COMMUNITY)
Admission: RE | Admit: 2022-10-24 | Discharge: 2022-10-24 | Disposition: A | Discharge status: Home / Self Care | Payer: Medicare HMO | Source: Ambulatory Visit | Attending: Family Medicine | Admitting: Family Medicine

## 2022-10-24 ENCOUNTER — Ambulatory Visit: Payer: Medicare HMO | Admitting: Family Medicine

## 2022-10-24 ENCOUNTER — Encounter: Payer: Self-pay | Admitting: Physician Assistant

## 2022-10-24 ENCOUNTER — Ambulatory Visit: Payer: Medicare HMO | Attending: Physician Assistant | Admitting: Physician Assistant

## 2022-10-24 VITALS — BP 116/68 | HR 59 | Temp 97.5°F | Ht 70.0 in | Wt 191.0 lb

## 2022-10-24 VITALS — BP 116/58 | HR 59 | Ht 70.0 in | Wt 191.2 lb

## 2022-10-24 DIAGNOSIS — I5189 Other ill-defined heart diseases: Secondary | ICD-10-CM

## 2022-10-24 DIAGNOSIS — M545 Low back pain, unspecified: Secondary | ICD-10-CM | POA: Insufficient documentation

## 2022-10-24 DIAGNOSIS — E782 Mixed hyperlipidemia: Secondary | ICD-10-CM | POA: Diagnosis not present

## 2022-10-24 DIAGNOSIS — I10 Essential (primary) hypertension: Secondary | ICD-10-CM | POA: Diagnosis not present

## 2022-10-24 DIAGNOSIS — I2510 Atherosclerotic heart disease of native coronary artery without angina pectoris: Secondary | ICD-10-CM | POA: Diagnosis not present

## 2022-10-24 DIAGNOSIS — G8929 Other chronic pain: Secondary | ICD-10-CM | POA: Insufficient documentation

## 2022-10-24 DIAGNOSIS — I6523 Occlusion and stenosis of bilateral carotid arteries: Secondary | ICD-10-CM | POA: Diagnosis not present

## 2022-10-24 DIAGNOSIS — M47816 Spondylosis without myelopathy or radiculopathy, lumbar region: Secondary | ICD-10-CM | POA: Diagnosis not present

## 2022-10-24 DIAGNOSIS — I700 Atherosclerosis of aorta: Secondary | ICD-10-CM | POA: Diagnosis not present

## 2022-10-24 MED ORDER — BACLOFEN 10 MG PO TABS: 5.0000 mg | ORAL_TABLET | Freq: Three times a day (TID) | ORAL | 0 refills | Status: DC | PRN

## 2022-10-24 NOTE — Progress Notes (Signed)
Subjective:  Patient ID: Michael [name], male    DOB: December 01, 1955  Age: 67 y.o. MRN: [id number]  CC: Follow-up   HPI:  67 year old male with hypertension, coronary artery disease status post CABG, carotid artery disease status post carotid endarterectomy, hyperlipidemia presents for follow-up.  Lower extremity edema has resolved.  Has been evaluated by cardiology.  Stable.  Blood pressure well-controlled.  Needs recheck of lipids.  Last LDL 57.  Goal less than 55 given high risk.  He is compliant with statin.  Patient reports that he is having ongoing back pain.  States that he is having low back pain which is now limiting his activities.  Worse with activity and range of motion.  No relieving factors.  No radicular symptoms.  Patient Active Problem List   Diagnosis Date Noted   Chronic bilateral low back pain without sciatica 10/24/2022   Grade II diastolic dysfunction 08/14/2022   Nodule of skin of right hand 06/30/2022   Erectile dysfunction 06/30/2022   Left inguinal hernia 05/22/2022   BPH (benign prostatic hyperplasia) 06/22/2021   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   S/P carotid endarterectomy 06/03/2013   Gout 03/24/2012   Obesity (BMI 30-39.9) 12/06/2011   Coronary artery disease 12/15/2010   S/P coronary artery bypass graft x 4 12/15/2010   Hyperlipidemia 12/15/2008   Essential hypertension 12/15/2008    Social Hx   Social History   Socioeconomic History   Marital status: Divorced    Spouse name: Not on file   Number of children: Not on file   Years of education: Not on file   Highest education level: 12th grade  Occupational History   Occupation: CONSTRUCTION/MAINT    Employer: [employer]    Comment: [employer]  Tobacco Use   Smoking status: Former    Current packs/day: 0.00    Average packs/day: 1 pack/day for 20.0 years (20.0 ttl pk-yrs)    Types: Cigarettes    Start date: 05/14/1976    Quit date: 05/14/1996    Years since  quitting: 26.4   Smokeless tobacco: Never   Tobacco comments:    quit 1995  Vaping Use   Vaping status: Never Used  Substance and Sexual Activity   Alcohol use: Yes    Alcohol/week: 6.0 standard drinks of alcohol    Types: 6 Cans of beer per week   Drug use: No   Sexual activity: Never  Other Topics Concern   Not on file  Social History Narrative   Not on file   Social Determinants of Health   Financial Resource Strain: Low Risk  (10/20/2022)   Overall Financial Resource Strain (CARDIA)    Difficulty of Paying Living Expenses: Not hard at all  Food Insecurity: No Food Insecurity (10/20/2022)   Hunger Vital Sign    Worried About Running Out of Food in the Last Year: Never true    Ran Out of Food in the Last Year: Never true  Transportation Needs: No Transportation Needs (10/20/2022)   PRAPARE - Administrator, Civil Service (Medical): No    Lack of Transportation (Non-Medical): No  Physical Activity: Sufficiently Active (10/20/2022)   Exercise Vital Sign    Days of Exercise per Week: 5 days    Minutes of Exercise per Session: 80 min  Stress: No Stress Concern Present (10/20/2022)   [employer] of Occupational Health - Occupational Stress Questionnaire    Feeling of Stress : Not at all  Social Connections: Moderately Isolated (10/20/2022)  Social Advertising account executive [NHANES]    Frequency of Communication with Friends and Family: Once a week    Frequency of Social Gatherings with Friends and Family: Once a week    Attends Religious Services: More than 4 times per year    Active Member of [employer] or Organizations: Yes    Attends Engineer, structural: More than 4 times per year    Marital Status: Divorced    Review of Systems Per HPI  Objective:  BP 116/68   Pulse (!) 59   Temp (!) 97.5 F (36.4 C)   Ht 5\' 10"  (1.778 m)   Wt 191 lb (86.6 kg)   SpO2 96%   BMI 27.41 kg/m      10/24/2022   10:38 AM 10/24/2022    7:48 AM  07/27/2022   11:08 AM  BP/Weight  Systolic BP 116 116 129  Diastolic BP 68 58 75  Wt. (Lbs) 191 191.2 207  BMI 27.41 kg/m2 27.43 kg/m2 30.57 kg/m2    Physical Exam Vitals and nursing note reviewed.  Constitutional:      General: He is not in acute distress.    Appearance: Normal appearance.  HENT:     Head: Normocephalic and atraumatic.  Cardiovascular:     Rate and Rhythm: Normal rate and regular rhythm.  Pulmonary:     Effort: Pulmonary effort is normal.     Breath sounds: Normal breath sounds.  Musculoskeletal:     Comments: Lumbar spine nontender to palpation.  Neurological:     Mental Status: He is alert.  Psychiatric:        Mood and Affect: Mood normal.        Behavior: Behavior normal.     Lab Results  Component Value Date   WBC 5.8 03/22/2020   HGB 13.6 03/22/2020   HCT 40.1 03/22/2020   PLT 170 03/22/2020   GLUCOSE 120 (H) 07/21/2022   CHOL 134 10/24/2021   TRIG 65 10/24/2021   HDL 64 10/24/2021   LDLCALC 57 10/24/2021   ALT 36 07/14/2022   AST 37 07/14/2022   NA 138 07/21/2022   K 4.5 07/21/2022   CL 94 (L) 07/21/2022   CREATININE 1.18 07/21/2022   BUN 15 07/21/2022   CO2 28 07/21/2022   TSH 1.150 11/01/2018   PSA 1.04 10/08/2013   INR 1.0 06/06/2013   HGBA1C 5.8 (H) 07/27/2022     Assessment & Plan:   Problem List Items Addressed This Visit       Cardiovascular and Mediastinum   Essential hypertension - Primary (Chronic)    Stable.  Continue current medications.        Other   Hyperlipidemia (Chronic)    Continue statin.  Lipid panel has already been ordered by cardiology.      Chronic bilateral low back pain without sciatica    X-ray for further evaluation.  Baclofen as directed.      Relevant Medications   baclofen (LIORESAL) 10 MG tablet   Other Relevant Orders   DG Lumbar Spine Complete    Meds ordered this encounter  Medications   baclofen (LIORESAL) 10 MG tablet    Sig: Take 0.5-1 tablets (5-10 mg total) by mouth 3  (three) times daily as needed (Back pain.).    Dispense:  30 each    Refill:  0   Tru Rana DO [facility]

## 2022-10-24 NOTE — Assessment & Plan Note (Addendum)
Continue statin.  Lipid panel has already been ordered by cardiology.

## 2022-10-24 NOTE — Patient Instructions (Signed)
Medication Instructions:  Your physician recommends that you continue on your current medications as directed. Please refer to the Current Medication list given to you today.  *If you need a refill on your cardiac medications before your next appointment, please call your pharmacy*   Lab Work: TODAY:  CMET & LIPID  If you have labs (blood work) drawn today and your tests are completely normal, you will receive your results only by: MyChart Message (if you have MyChart) OR A paper copy in the mail If you have any lab test that is abnormal or we need to change your treatment, we will call you to review the results.   Testing/Procedures: Your physician has requested that you have a carotid duplex. This test is an ultrasound of the carotid arteries in your neck. It looks at blood flow through these arteries that supply the brain with blood. Allow one hour for this exam. There are no restrictions or special instructions.    Follow-Up: At [facility], you and your health needs are our priority.  As part of our continuing mission to provide you with exceptional heart care, we have created designated Provider Care Teams.  These Care Teams include your primary Cardiologist (physician) and Advanced Practice Providers (APPs -  Physician Assistants and Nurse Practitioners) who all work together to provide you with the care you need, when you need it.  We recommend signing up for the patient portal called "MyChart".  Sign up information is provided on this After Visit Summary.  MyChart is used to connect with patients for Virtual Visits (Telemedicine).  Patients are able to view lab/test results, encounter notes, upcoming appointments, etc.  Non-urgent messages can be sent to your provider as well.   To learn more about what you can do with MyChart, go to [email or web address].    Your next appointment:   12 month(s)  Provider:   Tereso Newcomer, PA-C         Other Instructions

## 2022-10-24 NOTE — Assessment & Plan Note (Signed)
Status post CABG in 1999 with all grafts patent by cardiac catheterization in 2015. Myoview in 2020 was low risk. He is not having chest symptoms suggestive of angina. -Continue Aspirin 81mg  daily and Crestor 40mg  daily. -Follow up in 1 year.

## 2022-10-24 NOTE — Assessment & Plan Note (Signed)
Grade 2 diastolic dysfunction on echocardiogram. No current symptoms of heart failure. AHA Stage B.  -Continue current management. -Advise patient to monitor weight and consider use of Lasix if sudden weight gain (> 3 lbs in 1 day) or swelling occur.

## 2022-10-24 NOTE — Patient Instructions (Signed)
Xray today.  Continue current medications.  Consider PT.  Take care  Dr. Adriana Simas

## 2022-10-24 NOTE — Assessment & Plan Note (Signed)
LDL slightly above target at 57 (goal <55). -Continue Crestor 40mg  daily. -Check lipid panel, CMET today.

## 2022-10-24 NOTE — Assessment & Plan Note (Signed)
X-ray for further evaluation.  Baclofen as directed.

## 2022-10-24 NOTE — Addendum Note (Signed)
Addended by: Burnetta Sabin on: 10/24/2022 08:34 AM   Modules accepted: Orders

## 2022-10-24 NOTE — Assessment & Plan Note (Signed)
Blood pressure well controlled. -Continue Amlodipine 10mg  daily and Carvedilol 25mg  twice daily. -CMET today

## 2022-10-24 NOTE — Assessment & Plan Note (Signed)
Stable.  Continue current medications.

## 2022-10-25 LAB — COMPREHENSIVE METABOLIC PANEL
ALT: 58 [IU]/L — ABNORMAL HIGH (ref 0–44)
AST: 84 [IU]/L — ABNORMAL HIGH (ref 0–40)
Albumin: 4.3 g/dL (ref 3.9–4.9)
Alkaline Phosphatase: 72 [IU]/L (ref 44–121)
BUN/Creatinine Ratio: 12 (ref 10–24)
BUN: 12 mg/dL (ref 8–27)
Bilirubin Total: 0.3 mg/dL (ref 0.0–1.2)
CO2: 30 mmol/L — ABNORMAL HIGH (ref 20–29)
Calcium: 9.7 mg/dL (ref 8.6–10.2)
Chloride: 101 mmol/L (ref 96–106)
Creatinine, Ser: 1 mg/dL (ref 0.76–1.27)
Globulin, Total: 2.6 g/dL (ref 1.5–4.5)
Glucose: 116 mg/dL — ABNORMAL HIGH (ref 70–99)
Potassium: 4.7 mmol/L (ref 3.5–5.2)
Sodium: 139 mmol/L (ref 134–144)
Total Protein: 6.9 g/dL (ref 6.0–8.5)
eGFR: 82 mL/min/{1.73_m2} (ref >59)

## 2022-10-25 LAB — LIPID PANEL
Chol/HDL Ratio: 2.2 ratio (ref 0.0–5.0)
Cholesterol, Total: 134 mg/dL (ref 100–199)
HDL: 62 mg/dL (ref >39)
LDL Chol Calc (NIH): 60 mg/dL (ref 0–99)
Triglycerides: 57 mg/dL (ref 0–149)
VLDL Cholesterol Cal: 12 mg/dL (ref 5–40)

## 2022-10-26 ENCOUNTER — Telehealth: Payer: Self-pay | Admitting: *Deleted

## 2022-10-26 ENCOUNTER — Other Ambulatory Visit: Payer: Self-pay | Admitting: *Deleted

## 2022-10-26 DIAGNOSIS — Z79899 Other long term (current) drug therapy: Secondary | ICD-10-CM

## 2022-10-26 NOTE — Telephone Encounter (Signed)
-----   Message from Tereso Newcomer sent at 10/25/2022  4:38 PM EDT ----- Results sent to [name] via MyChart. See MyChart comments below. PLAN:  -Ask pt if he drinks alcohol. If yes, reduce amount. -Repeat Hepatic panel, GGT in 2 weeks  Michael [name]  Your kidney function (creatinine), potassium are normal.  Your liver enzymes (AST, ALT) are elevated.  Your LDL cholesterol remains above goal.  However, with the elevated enzymes, I will hold off on any further medication changes at this time. If you drink any alcohol, let's try to reduce it some and repeat your liver test in a few weeks. Tereso Newcomer, PA-C

## 2022-10-27 ENCOUNTER — Ambulatory Visit: Payer: Medicare HMO | Admitting: Family Medicine

## 2022-11-02 ENCOUNTER — Ambulatory Visit (HOSPITAL_COMMUNITY)
Admission: RE | Admit: 2022-11-02 | Discharge: 2022-11-02 | Disposition: A | Discharge status: Home / Self Care | Payer: Medicare HMO | Source: Ambulatory Visit | Attending: Physician Assistant | Admitting: Physician Assistant

## 2022-11-02 DIAGNOSIS — I6521 Occlusion and stenosis of right carotid artery: Secondary | ICD-10-CM | POA: Diagnosis not present

## 2022-11-02 DIAGNOSIS — I2510 Atherosclerotic heart disease of native coronary artery without angina pectoris: Secondary | ICD-10-CM | POA: Insufficient documentation

## 2022-11-03 ENCOUNTER — Encounter: Payer: Self-pay | Admitting: Physician Assistant

## 2022-11-09 DIAGNOSIS — Z79899 Other long term (current) drug therapy: Secondary | ICD-10-CM | POA: Diagnosis not present

## 2022-11-10 LAB — HEPATIC FUNCTION PANEL
ALT: 30 [IU]/L (ref 0–44)
AST: 28 [IU]/L (ref 0–40)
Albumin: 4.3 g/dL (ref 3.9–4.9)
Alkaline Phosphatase: 55 [IU]/L (ref 44–121)
Bilirubin Total: 0.5 mg/dL (ref 0.0–1.2)
Bilirubin, Direct: 0.21 mg/dL (ref 0.00–0.40)
Total Protein: 6.8 g/dL (ref 6.0–8.5)

## 2022-11-10 LAB — GAMMA GT: GGT: 38 [IU]/L (ref 0–65)

## 2022-12-25 ENCOUNTER — Ambulatory Visit: Payer: Medicare HMO | Admitting: Family Medicine

## 2022-12-25 ENCOUNTER — Ambulatory Visit (HOSPITAL_COMMUNITY)
Admission: RE | Admit: 2022-12-25 | Discharge: 2022-12-25 | Disposition: A | Discharge status: Home / Self Care | Payer: Medicare HMO | Source: Ambulatory Visit | Attending: Family Medicine | Admitting: Family Medicine

## 2022-12-25 VITALS — BP 157/79 | HR 66 | Temp 98.3°F | Ht 70.0 in | Wt 190.6 lb

## 2022-12-25 DIAGNOSIS — M542 Cervicalgia: Secondary | ICD-10-CM | POA: Insufficient documentation

## 2022-12-25 DIAGNOSIS — M79602 Pain in left arm: Secondary | ICD-10-CM | POA: Diagnosis not present

## 2022-12-25 DIAGNOSIS — M25512 Pain in left shoulder: Secondary | ICD-10-CM | POA: Insufficient documentation

## 2022-12-25 DIAGNOSIS — M47812 Spondylosis without myelopathy or radiculopathy, cervical region: Secondary | ICD-10-CM | POA: Diagnosis not present

## 2022-12-25 DIAGNOSIS — M4802 Spinal stenosis, cervical region: Secondary | ICD-10-CM | POA: Diagnosis not present

## 2022-12-25 DIAGNOSIS — M19012 Primary osteoarthritis, left shoulder: Secondary | ICD-10-CM | POA: Diagnosis not present

## 2022-12-25 MED ORDER — PREDNISONE 10 MG PO TABS: ORAL_TABLET | ORAL | 0 refills | Status: DC

## 2022-12-25 NOTE — Assessment & Plan Note (Addendum)
Unclear whether this is coming from the shoulder or the neck.  X-ray was obtained today and was independently reviewed by me.  Interpretation: Arthritis noted of the glenohumeral joint as well as AC joint.  Placing on prednisone.

## 2022-12-25 NOTE — Patient Instructions (Signed)
Xrays today.  We will call with results.  Take care  Dr. Lacinda Axon

## 2022-12-25 NOTE — Progress Notes (Signed)
Subjective:  Patient ID: Michael [name], male    DOB: 17-Apr-1955  Age: 67 y.o. MRN: [id number]  CC: Neck and shoulder pain   HPI:  67 year old male presents for evaluation of the above.  2-week history of posterior neck pain which seems to go down the left arm.  No numbness or tingling.  No recent fall, trauma, injury.  Dull pain.  No relief with meloxicam or ibuprofen.  He has decreased range of motion of the neck as well.  Patient Active Problem List   Diagnosis Date Noted   Neck pain 12/25/2022   Acute pain of left shoulder 12/25/2022   Chronic bilateral low back pain without sciatica 10/24/2022   Grade II diastolic dysfunction 08/14/2022   Erectile dysfunction 06/30/2022   Carotid artery disease (HCC) 10/24/2021   BPH (benign prostatic hyperplasia) 06/22/2021   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   S/P carotid endarterectomy 06/03/2013   Gout 03/24/2012   Obesity (BMI 30-39.9) 12/06/2011   Coronary artery disease 12/15/2010   S/P coronary artery bypass graft x 4 12/15/2010   Hyperlipidemia 12/15/2008   Essential hypertension 12/15/2008    Social Hx   Social History   Socioeconomic History   Marital status: Divorced    Spouse name: Not on file   Number of children: Not on file   Years of education: Not on file   Highest education level: GED or equivalent  Occupational History   Occupation: CONSTRUCTION/MAINT    Employer: [employer]    Comment: [employer] gam[employer]  Tobacco Use   Smoking status: Former    Current packs/day: 0.00    Average packs/day: 1 pack/day for 20.0 years (20.0 ttl pk-yrs)    Types: Cigarettes    Start date: 05/14/1976    Quit date: 05/14/1996    Years since quitting: 26.6   Smokeless tobacco: Never   Tobacco comments:    quit 1995  Vaping Use   Vaping status: Never Used  Substance and Sexual Activity   Alcohol use: Yes    Alcohol/week: 6.0 standard drinks of alcohol    Types: 6 Cans of beer per week   Drug use: No   Sexual  activity: Never  Other Topics Concern   Not on file  Social History Narrative   Not on file   Social Drivers of Health   Financial Resource Strain: Low Risk  (12/20/2022)   Overall Financial Resource Strain (CARDIA)    Difficulty of Paying Living Expenses: Not hard at all  Food Insecurity: No Food Insecurity (12/20/2022)   Hunger Vital Sign    Worried About Running Out of Food in the Last Year: Never true    Ran Out of Food in the Last Year: Never true  Transportation Needs: No Transportation Needs (12/20/2022)   PRAPARE - Administrator, Civil Service (Medical): No    Lack of Transportation (Non-Medical): No  Physical Activity: Sufficiently Active (12/20/2022)   Exercise Vital Sign    Days of Exercise per Week: 4 days    Minutes of Exercise per Session: 60 min  Stress: No Stress Concern Present (12/20/2022)   [employer] of Occupational Health - Occupational Stress Questionnaire    Feeling of Stress : Not at all  Social Connections: Moderately Isolated (12/20/2022)   Social Connection and Isolation Panel [NHANES]    Frequency of Communication with Friends and Family: Once a week    Frequency of Social Gatherings with Friends and Family: Once a week  Attends Religious Services: More than 4 times per year    Active Member of Clubs or Organizations: Yes    Attends Banker Meetings: 1 to 4 times per year    Marital Status: Divorced    Review of Systems Per HPI  Objective:  BP (!) 157/79   Pulse 66   Temp 98.3 F (36.8 C)   Ht 5\' 10"  (1.778 m)   Wt 190 lb 9.6 oz (86.5 kg)   SpO2 98%   BMI 27.35 kg/m      12/25/2022   10:24 AM 12/25/2022    9:56 AM 10/24/2022   10:38 AM  BP/Weight  Systolic BP 157 161 116  Diastolic BP 79 89 68  Wt. (Lbs)  190.6 191  BMI  27.35 kg/m2 27.41 kg/m2    Physical Exam Vitals and nursing note reviewed.  Constitutional:      General: He is not in acute distress.    Appearance: Normal appearance.   HENT:     Head: Normocephalic and atraumatic.  Neck:     Comments: Decreased range of motion of the neck particularly in rotation. Musculoskeletal:     Comments: Shoulder: Inspection reveals no abnormalities, atrophy or asymmetry. Palpation is normal with no tenderness over AC joint or bicipital groove. ROM is full in all planes. Rotator cuff strength normal throughout. + Hawkin's test   Neurological:     Mental Status: He is alert.     Lab Results  Component Value Date   WBC 5.8 03/22/2020   HGB 13.6 03/22/2020   HCT 40.1 03/22/2020   PLT 170 03/22/2020   GLUCOSE 116 (H) 10/24/2022   CHOL 134 10/24/2022   TRIG 57 10/24/2022   HDL 62 10/24/2022   LDLCALC 60 10/24/2022   ALT 30 11/09/2022   AST 28 11/09/2022   NA 139 10/24/2022   K 4.7 10/24/2022   CL 101 10/24/2022   CREATININE 1.00 10/24/2022   BUN 12 10/24/2022   CO2 30 (H) 10/24/2022   TSH 1.150 11/01/2018   PSA 1.04 10/08/2013   INR 1.0 06/06/2013   HGBA1C 5.8 (H) 07/27/2022     Assessment & Plan:   Problem List Items Addressed This Visit       Other   Acute pain of left shoulder   Unclear whether this is coming from the shoulder or the neck.  X-ray was obtained today and was independently reviewed by me.  Interpretation: Arthritis noted of the glenohumeral joint as well as AC joint.  Placing on prednisone.      Relevant Orders   DG Shoulder Left (Completed)   Neck pain - Primary   X-ray revealed significant degenerative changes.  Prednisone as prescribed.      Relevant Orders   DG Cervical Spine Complete (Completed)    Meds ordered this encounter  Medications   predniSONE (DELTASONE) 10 MG tablet    Sig: 50 mg daily x 2 days, then 40 mg daily x 2 days, then 30 mg daily x 2 days, then 20 mg daily x 2 days, then 10 mg daily x 2 days.    Dispense:  30 tablet    Refill:  0   Lundon Rosier DO [facility]

## 2022-12-25 NOTE — Assessment & Plan Note (Signed)
X-ray revealed significant degenerative changes.  Prednisone as prescribed.

## 2023-01-29 DIAGNOSIS — H04123 Dry eye syndrome of bilateral lacrimal glands: Secondary | ICD-10-CM | POA: Diagnosis not present

## 2023-02-10 ENCOUNTER — Other Ambulatory Visit: Payer: Self-pay | Admitting: Family Medicine

## 2023-03-05 ENCOUNTER — Encounter: Payer: Self-pay | Admitting: Family Medicine

## 2023-03-05 ENCOUNTER — Ambulatory Visit: Admitting: Family Medicine

## 2023-03-05 DIAGNOSIS — M67449 Ganglion, unspecified hand: Secondary | ICD-10-CM | POA: Diagnosis not present

## 2023-03-05 DIAGNOSIS — M25512 Pain in left shoulder: Secondary | ICD-10-CM | POA: Diagnosis not present

## 2023-03-05 MED ORDER — PREDNISONE 10 MG PO TABS: ORAL_TABLET | ORAL | 0 refills | Status: DC

## 2023-03-05 NOTE — Assessment & Plan Note (Signed)
 Offered referral to [facility].  Patient states that he will continue to monitor for now and reach out to me if he decides to proceed with referral.

## 2023-03-05 NOTE — Progress Notes (Signed)
 Subjective:  Patient ID: Michael [name], male    DOB: 1955-03-03  Age: 68 y.o. MRN: [id number]  CC:   Chief Complaint  Patient presents with   Mass    Chronic cyst on right hand.  Yesterday patient noticed bump on middle finger of right hand. Concern for what is causing it.    Medication Refill    Would like Prednisone refilled for shoulder pain flair ups    HPI:  68 year old male presents for evaluation of the above.  Patient reports that he has an area of concern to the right middle finger.  Is located at the PIP joint.  Not tender or painful but concerning for him.   Patient also states that prednisone has worked well for him regarding shoulder pain.  He would like to have a refill of this to have on hand for flares.  Has known osteoarthritis.  Patient Active Problem List   Diagnosis Date Noted   Ganglion cyst of finger 03/05/2023   Acute pain of left shoulder 12/25/2022   Chronic bilateral low back pain without sciatica 10/24/2022   Grade II diastolic dysfunction 08/14/2022   Erectile dysfunction 06/30/2022   Carotid artery disease (HCC) 10/24/2021   BPH (benign prostatic hyperplasia) 06/22/2021   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   S/P carotid endarterectomy 06/03/2013   Gout 03/24/2012   Obesity (BMI 30-39.9) 12/06/2011   Coronary artery disease 12/15/2010   S/P coronary artery bypass graft x 4 12/15/2010   Hyperlipidemia 12/15/2008   Essential hypertension 12/15/2008    Social Hx   Social History   Socioeconomic History   Marital status: Divorced    Spouse name: Not on file   Number of children: Not on file   Years of education: Not on file   Highest education level: GED or equivalent  Occupational History   Occupation: CONSTRUCTION/MAINT    Employer: [employer]    Comment: [employer]  Tobacco Use   Smoking status: Former    Current packs/day: 0.00    Average packs/day: 1 pack/day for 20.0 years (20.0 ttl pk-yrs)    Types:  Cigarettes    Start date: 05/14/1976    Quit date: 05/14/1996    Years since quitting: 26.8   Smokeless tobacco: Never   Tobacco comments:    quit 1995  Vaping Use   Vaping status: Never Used  Substance and Sexual Activity   Alcohol use: Yes    Alcohol/week: 6.0 standard drinks of alcohol    Types: 6 Cans of beer per week   Drug use: No   Sexual activity: Never  Other Topics Concern   Not on file  Social History Narrative   Not on file   Social Drivers of Health   Financial Resource Strain: Low Risk  (12/20/2022)   Overall Financial Resource Strain (CARDIA)    Difficulty of Paying Living Expenses: Not hard at all  Food Insecurity: No Food Insecurity (12/20/2022)   Hunger Vital Sign    Worried About Running Out of Food in the Last Year: Never true    Ran Out of Food in the Last Year: Never true  Transportation Needs: No Transportation Needs (12/20/2022)   PRAPARE - Administrator, Civil Service (Medical): No    Lack of Transportation (Non-Medical): No  Physical Activity: Sufficiently Active (12/20/2022)   Exercise Vital Sign    Days of Exercise per Week: 4 days    Minutes of Exercise per Session: 60 min  Stress:  No Stress Concern Present (12/20/2022)   [employer] of Occupational Health - Occupational Stress Questionnaire    Feeling of Stress : Not at all  Social Connections: Moderately Isolated (12/20/2022)   Social Connection and Isolation Panel [NHANES]    Frequency of Communication with Friends and Family: Once a week    Frequency of Social Gatherings with Friends and Family: Once a week    Attends Religious Services: More than 4 times per year    Active Member of [employer] or Organizations: Yes    Attends Banker Meetings: 1 to 4 times per year    Marital Status: Divorced    Review of Systems Per HPI  Objective:  BP (!) 140/80   Pulse 72   Temp 97.8 F (36.6 C)   Ht 5\' 10"  (1.778 m)   Wt 184 lb (83.5 kg)   SpO2 99%   BMI 26.40  kg/m      03/05/2023   10:08 AM 12/25/2022   10:24 AM 12/25/2022    9:56 AM  BP/Weight  Systolic BP 140 157 161  Diastolic BP 80 79 89  Wt. (Lbs) 184  190.6  BMI 26.4 kg/m2  27.35 kg/m2    Physical Exam Vitals and nursing note reviewed.  Constitutional:      General: He is not in acute distress.    Appearance: Normal appearance.  HENT:     Head: Normocephalic and atraumatic.  Pulmonary:     Effort: Pulmonary effort is normal. No respiratory distress.  Musculoskeletal:     Comments: Right hand -right middle finger with a firm nodule at the level of the PIP joint.  Neurological:     Mental Status: He is alert.  Psychiatric:        Mood and Affect: Mood normal.        Behavior: Behavior normal.     Lab Results  Component Value Date   WBC 5.8 03/22/2020   HGB 13.6 03/22/2020   HCT 40.1 03/22/2020   PLT 170 03/22/2020   GLUCOSE 116 (H) 10/24/2022   CHOL 134 10/24/2022   TRIG 57 10/24/2022   HDL 62 10/24/2022   LDLCALC 60 10/24/2022   ALT 30 11/09/2022   AST 28 11/09/2022   NA 139 10/24/2022   K 4.7 10/24/2022   CL 101 10/24/2022   CREATININE 1.00 10/24/2022   BUN 12 10/24/2022   CO2 30 (H) 10/24/2022   TSH 1.150 11/01/2018   PSA 1.04 10/08/2013   INR 1.0 06/06/2013   HGBA1C 5.8 (H) 07/27/2022     Assessment & Plan:  Acute pain of left shoulder Assessment & Plan: Prednisone as directed for flare.   Ganglion cyst of finger Assessment & Plan: Offered referral to hand surgeon.  Patient states that he will continue to monitor for now and reach out to me if he decides to proceed with referral.   Other orders -     predniSONE; 50 mg daily x 2 days, then 40 mg daily x 2 days, then 30 mg daily x 2 days, then 20 mg daily x 2 days, then 10 mg daily x 2 days.  Dispense: 30 tablet; Refill: 0    Follow-up:  Return if symptoms worsen or fail to improve.  Everlene Other DO [facility]

## 2023-03-05 NOTE — Patient Instructions (Signed)
 If it continues to bother you, please let me know and I'll send you to the hand surgeon.  Take care  Dr. Adriana Simas

## 2023-03-05 NOTE — Assessment & Plan Note (Signed)
 Prednisone as directed for flare.

## 2023-04-23 ENCOUNTER — Ambulatory Visit: Payer: Medicare HMO | Admitting: Family Medicine

## 2023-05-07 ENCOUNTER — Other Ambulatory Visit: Payer: Self-pay

## 2023-05-07 DIAGNOSIS — E782 Mixed hyperlipidemia: Secondary | ICD-10-CM

## 2023-05-07 MED ORDER — CARVEDILOL 25 MG PO TABS
25.0000 mg | ORAL_TABLET | Freq: Two times a day (BID) | ORAL | 1 refills | Status: DC
Start: 2023-05-07 — End: 2023-09-28

## 2023-05-07 MED ORDER — ROSUVASTATIN CALCIUM 40 MG PO TABS: 40.0000 mg | ORAL_TABLET | Freq: Every day | ORAL | 1 refills | Status: DC

## 2023-05-07 MED ORDER — AMLODIPINE BESYLATE 10 MG PO TABS: 10.0000 mg | ORAL_TABLET | Freq: Every day | ORAL | 1 refills | Status: DC

## 2023-05-07 NOTE — Telephone Encounter (Signed)
 RX sent to requested Pharmacy

## 2023-08-20 ENCOUNTER — Ambulatory Visit (INDEPENDENT_AMBULATORY_CARE_PROVIDER_SITE_OTHER): Admitting: Family Medicine

## 2023-08-20 ENCOUNTER — Ambulatory Visit: Payer: Self-pay

## 2023-08-20 VITALS — BP 130/60 | HR 66 | Temp 97.9°F | Ht 70.0 in | Wt 182.0 lb

## 2023-08-20 DIAGNOSIS — S39012A Strain of muscle, fascia and tendon of lower back, initial encounter: Secondary | ICD-10-CM | POA: Diagnosis not present

## 2023-08-20 MED ORDER — PREDNISONE 10 MG (21) PO TBPK: ORAL_TABLET | ORAL | 0 refills | Status: DC

## 2023-08-20 MED ORDER — TIZANIDINE HCL 4 MG PO TABS
4.0000 mg | ORAL_TABLET | Freq: Three times a day (TID) | ORAL | 0 refills | Status: DC | PRN
Start: 2023-08-20 — End: 2023-11-21

## 2023-08-20 NOTE — Telephone Encounter (Signed)
 FYI Only or Action Required?: FYI only for provider.  Patient was last seen in primary care on 03/05/2023 by Cook, Jayce G, DO.  Called Nurse Triage reporting Back Pain.  Symptoms began a week ago.  Interventions attempted: Prescription medications: Voltaren gel; meloxicam  and Ice/heat application.  Symptoms are: lower back pain, bilateral leg mild weakness (able to ambulate and bear weight)unchanged.  Triage Disposition: See PCP When Office is Open (Within 3 Days)  Patient/caregiver understands and will follow disposition?: Yes               Copied from CRM #[id number]. Topic: Clinical - Red Word Triage >> Aug 20, 2023  8:16 AM Kevelyn M wrote: Red Word that prompted transfer to Nurse Triage: Pulled a muscle in lower back while working out a week ago and he's still having pain. Reason for Disposition  [1] MODERATE back pain (e.g., interferes with normal activities) AND [2] present > 3 days  Answer Assessment - Initial Assessment Questions 1. ONSET: When did the pain begin? (e.g., minutes, hours, days)     1 week ago.  2. LOCATION: Where does it hurt? (upper, mid or lower back)     Lower.  3. SEVERITY: How bad is the pain?  (e.g., Scale 1-10; mild, moderate, or severe)     2/10 right now while standing. He states if he sits down or bends, or has been sitting a while and then gets up he states the pain flares up to a 7/10.  4. PATTERN: Is the pain constant? (e.g., yes, no; constant, intermittent)      Yes.  5. RADIATION: Does the pain shoot into your legs or somewhere else?     This weekend he states it migrated/spread up on his right side of his back.  6. CAUSE:  What do you think is causing the back pain?      Patient states he he was using a piece of work out equipment lifting around 75 pounds. He states he has a history of back pain and he states any little thing could throw it out.  7. BACK OVERUSE:  Any recent lifting of heavy objects, strenuous  work or exercise?     Exercise.  8. MEDICINES: What have you taken so far for the pain? (e.g., nothing, acetaminophen , NSAIDS)     Heating pad; Voltaren gel; meloxicam  (he states it was an old prescription he had and found).  9. NEUROLOGIC SYMPTOMS: Do you have any weakness, numbness, or problems with bowel/bladder control?     No numbness. He states both legs feel weak when walking x 5 days. No problems with bowel or bladder control.  10. OTHER SYMPTOMS: Do you have any other symptoms? (e.g., fever, abdomen pain, burning with urination, blood in urine)       No.  11. PREGNANCY: Is there any chance you are pregnant? When was your last menstrual period?       N/A.  Protocols used: Back Pain-A-AH

## 2023-08-20 NOTE — Progress Notes (Signed)
 Subjective:  Patient ID: Michael [name], male    DOB: [birth year]  Age: 68 y.o. MRN: [id number]  CC:   Chief Complaint  Patient presents with   Back Pain    No leg pain, pulled back when doing exercise ( rowing machine) last Monday Tried meloxicam  but was out of date and didn't help    HPI:  68 year old male with the below mentioned medical problems presents for evaluation of the above  Patient was recently exercising (rowing weight via a machine).  This occurred last Monday.  He subsequently developed low back pain after several reps.  He continues to have pain.  He has improved slightly but he still having issues.  Worse with certain range of motion and activity.  Took meloxicam  without resolution.  Has been using heat.  No other medications or inventions tried.  No radicular symptoms.  Patient Active Problem List   Diagnosis Date Noted   Lumbar strain 08/20/2023   Grade II diastolic dysfunction 08/14/2022   Erectile dysfunction 06/30/2022   Carotid artery disease (HCC) 10/24/2021   BPH (benign prostatic hyperplasia) 06/22/2021   S/P carotid endarterectomy 06/03/2013   Gout 03/24/2012   Obesity (BMI 30-39.9) 12/06/2011   Coronary artery disease 12/15/2010   S/P coronary artery bypass graft x 4 12/15/2010   Hyperlipidemia 12/15/2008   Essential hypertension 12/15/2008    Social Hx   Social History   Socioeconomic History   Marital status: Divorced    Spouse name: Not on file   Number of children: Not on file   Years of education: Not on file   Highest education level: 12th grade  Occupational History   Occupation: CONSTRUCTION/MAINT    Employer: [employer]    Comment: [employer]  Tobacco Use   Smoking status: Former    Current packs/day: 0.00    Average packs/day: 1 pack/day for 20.0 years (20.0 ttl pk-yrs)    Types: Cigarettes    Start date: 05/14/1976    Quit date: 05/14/1996    Years since quitting: 27.2   Smokeless tobacco: Never   Tobacco  comments:    quit 1995  Vaping Use   Vaping status: Never Used  Substance and Sexual Activity   Alcohol use: Yes    Alcohol/week: 6.0 standard drinks of alcohol    Types: 6 Cans of beer per week   Drug use: No   Sexual activity: Never  Other Topics Concern   Not on file  Social History Narrative   Not on file   Social Drivers of Health   Financial Resource Strain: Low Risk  (08/20/2023)   Overall Financial Resource Strain (CARDIA)    Difficulty of Paying Living Expenses: Not hard at all  Food Insecurity: No Food Insecurity (08/20/2023)   Hunger Vital Sign    Worried About Running Out of Food in the Last Year: Never true    Ran Out of Food in the Last Year: Never true  Transportation Needs: No Transportation Needs (08/20/2023)   PRAPARE - Administrator, Civil Service (Medical): No    Lack of Transportation (Non-Medical): No  Physical Activity: Sufficiently Active (08/20/2023)   Exercise Vital Sign    Days of Exercise per Week: 5 days    Minutes of Exercise per Session: 90 min  Stress: No Stress Concern Present (08/20/2023)   [employer] of Occupational Health - Occupational Stress Questionnaire    Feeling of Stress: Not at all  Social Connections: Moderately Isolated (08/20/2023)  Social Advertising account executive    Frequency of Communication with Friends and Family: Once a week    Frequency of Social Gatherings with Friends and Family: Once a week    Attends Religious Services: More than 4 times per year    Active Member of [employer] or Organizations: Yes    Attends Banker Meetings: 1 to 4 times per year    Marital Status: Divorced    Review of Systems Per HPI  Objective:  BP 130/60   Pulse 66   Temp 97.9 F (36.6 C)   Ht 5' 10 (1.778 m)   Wt 182 lb (82.6 kg)   SpO2 97%   BMI 26.11 kg/m      08/20/2023    1:47 PM 08/20/2023    1:43 PM 03/05/2023   10:08 AM  BP/Weight  Systolic BP 130 147 140  Diastolic BP 60 70 80  Wt. (Lbs)   182 184  BMI  26.11 kg/m2 26.4 kg/m2    Physical Exam Constitutional:      General: He is not in acute distress.    Appearance: Normal appearance.  HENT:     Head: Normocephalic and atraumatic.  Pulmonary:     Effort: Pulmonary effort is normal. No respiratory distress.  Musculoskeletal:     Comments: Tension noted to the paraspinal musculature of the lumbar spine on the right.  Neurological:     Mental Status: He is alert.  Psychiatric:        Mood and Affect: Mood normal.        Behavior: Behavior normal.     Lab Results  Component Value Date   WBC 5.8 03/22/2020   HGB 13.6 03/22/2020   HCT 40.1 03/22/2020   PLT 170 03/22/2020   GLUCOSE 116 (H) 10/24/2022   CHOL 134 10/24/2022   TRIG 57 10/24/2022   HDL 62 10/24/2022   LDLCALC 60 10/24/2022   ALT 30 11/09/2022   AST 28 11/09/2022   NA 139 10/24/2022   K 4.7 10/24/2022   CL 101 10/24/2022   CREATININE 1.00 10/24/2022   BUN 12 10/24/2022   CO2 30 (H) 10/24/2022   TSH 1.150 11/01/2018   PSA 1.04 10/08/2013   INR 1.0 06/06/2013   HGBA1C 5.8 (H) 07/27/2022     Assessment & Plan:  Strain of lumbar region, initial encounter Assessment & Plan: Prednisone  and Zanaflex  as directed.  Advised heat.  Supportive care.  Orders: -     tiZANidine  HCl; Take 1 tablet (4 mg total) by mouth every 8 (eight) hours as needed for muscle spasms.  Dispense: 30 tablet; Refill: 0 -     predniSONE ; 6 tablets on day 1; decrease by 1 tablet daily until gone.  Dispense: 21 tablet; Refill: 0    Follow-up:  Return if symptoms worsen or fail to improve.  Jacqulyn Ahle DO [facility]

## 2023-08-20 NOTE — Assessment & Plan Note (Signed)
 Prednisone  and Zanaflex  as directed.  Advised heat.  Supportive care.

## 2023-09-06 DIAGNOSIS — H5203 Hypermetropia, bilateral: Secondary | ICD-10-CM | POA: Diagnosis not present

## 2023-09-27 ENCOUNTER — Other Ambulatory Visit: Payer: Self-pay | Admitting: Physician Assistant

## 2023-09-27 DIAGNOSIS — E782 Mixed hyperlipidemia: Secondary | ICD-10-CM

## 2023-10-25 ENCOUNTER — Other Ambulatory Visit: Payer: Self-pay | Admitting: Physician Assistant

## 2023-10-25 ENCOUNTER — Other Ambulatory Visit: Payer: Self-pay

## 2023-10-25 DIAGNOSIS — E782 Mixed hyperlipidemia: Secondary | ICD-10-CM

## 2023-10-25 MED ORDER — CARVEDILOL 25 MG PO TABS
25.0000 mg | ORAL_TABLET | Freq: Two times a day (BID) | ORAL | 0 refills | Status: DC
Start: 2023-10-25 — End: 2023-11-07

## 2023-10-25 MED ORDER — ROSUVASTATIN CALCIUM 40 MG PO TABS: 40.0000 mg | ORAL_TABLET | Freq: Every day | ORAL | 0 refills | Status: DC

## 2023-10-25 MED ORDER — AMLODIPINE BESYLATE 10 MG PO TABS: 10.0000 mg | ORAL_TABLET | Freq: Every day | ORAL | 0 refills | Status: DC

## 2023-10-25 NOTE — Telephone Encounter (Signed)
 RX sent in

## 2023-11-07 ENCOUNTER — Telehealth: Payer: Self-pay | Admitting: Physician Assistant

## 2023-11-07 DIAGNOSIS — E782 Mixed hyperlipidemia: Secondary | ICD-10-CM

## 2023-11-07 MED ORDER — CARVEDILOL 25 MG PO TABS: 25.0000 mg | ORAL_TABLET | Freq: Two times a day (BID) | ORAL | 0 refills | Status: DC

## 2023-11-07 MED ORDER — AMLODIPINE BESYLATE 10 MG PO TABS: 10.0000 mg | ORAL_TABLET | Freq: Every day | ORAL | 0 refills | Status: DC

## 2023-11-07 MED ORDER — ROSUVASTATIN CALCIUM 40 MG PO TABS: 40.0000 mg | ORAL_TABLET | Freq: Every day | ORAL | 0 refills | Status: DC

## 2023-11-07 NOTE — Telephone Encounter (Signed)
 Pt scheduled to see Glendia Ferrier, 01/16/24.  90 days with 0 refills have been sent to [facility].

## 2023-11-07 NOTE — Telephone Encounter (Signed)
 Reason for walk-in: Walk-in Reasons: medication refill (pt needs refills on Carvedilol  (25mg ), Rosuvastatin  (40mg ), & Amlodipine  Besylate (10mg )). Pt just scheduled upcoming appt in 01/2024, as well.  If patient is requesting to be seen today, or if patient is having symptoms:  What symptoms are being reported (if any)?  N/A  Route to triage pool and ensure Teams message has been sent to the Triage Walk-In chat.  3.   For medication samples, medication refills, HIM requests, appointment requests, lab-related requests, or form/record drop-off, please route to the appropriate pool.

## 2023-11-09 ENCOUNTER — Telehealth: Payer: Self-pay

## 2023-11-09 ENCOUNTER — Ambulatory Visit

## 2023-11-09 ENCOUNTER — Other Ambulatory Visit: Payer: Self-pay

## 2023-11-09 VITALS — Ht 70.0 in | Wt 175.0 lb

## 2023-11-09 DIAGNOSIS — Z0000 Encounter for general adult medical examination without abnormal findings: Secondary | ICD-10-CM | POA: Diagnosis not present

## 2023-11-09 DIAGNOSIS — Z1211 Encounter for screening for malignant neoplasm of colon: Secondary | ICD-10-CM

## 2023-11-09 DIAGNOSIS — Z79899 Other long term (current) drug therapy: Secondary | ICD-10-CM

## 2023-11-09 DIAGNOSIS — E782 Mixed hyperlipidemia: Secondary | ICD-10-CM

## 2023-11-09 DIAGNOSIS — Z2821 Immunization not carried out because of patient refusal: Secondary | ICD-10-CM

## 2023-11-09 NOTE — Telephone Encounter (Signed)
 Spoke with patient and advised of Lab orders placed (CMET, Fasting Lipid). Also verified follow up appointment for Jan. Patient acknowledged undesrtanding and all questions answered.

## 2023-11-09 NOTE — Progress Notes (Signed)
 Per Glendia Ferrier PA-C, CMET and Fasting Lipid Panel ordered.

## 2023-11-09 NOTE — Progress Notes (Signed)
 I connected with  Michael [name] on 11/09/23 by a audio enabled telemedicine application and verified that I am speaking with the correct person using two identifiers.  Patient Location: Home  Provider Location: Home Office  GI referral placed for screening colonoscopy. Patient refused all vaccines  I discussed the limitations of evaluation and management by telemedicine. The patient expressed understanding and agreed to proceed.  Subjective:   Michael [name] is a 68 y.o. male who presents for a Medicare Annual Wellness Visit.  Allergies (verified) Anesthetics, amide; Ramipril; and Losartan    History: Past Medical History:  Diagnosis Date   Allergy    Back pain    Bruises easily    CAD (coronary artery disease)    a. s/p CABG 1999. b. Cath 2015 patent grafts.   Carotid artery disease    a. s/p L CEA 2014, followed by [facility].   Essential hypertension    GERD (gastroesophageal reflux disease)    was on Prilosec;is not currently on any meds   Gout    takes Allopurinol  daily   Grade II diastolic dysfunction 08/14/2022   TTE 08/14/2022: EF 60-65, no RWMA, GR 2 DD, normal RVSF, normal PASP, trivial MR, trivial AI, AV sclerosis, RAP 3     Hyperglycemia    Hyperlipidemia    Malignant hyperthermia    Potential but not Confirmed   Neck pain    yrs ago and saw a chiropractor occasionally   Urinary urgency    Past Surgical History:  Procedure Laterality Date   ANKLE FRACTURE SURGERY Left 1981   CARDIAC CATHETERIZATION  2015   CAROTID ENDARTERECTOMY Left 04-29-12   cea   COLONOSCOPY     COLONOSCOPY N/A 11/21/2013   Procedure: COLONOSCOPY;  Surgeon: Margo LITTIE Haddock, MD;  Location: [facility];  Service: Endoscopy;  Laterality: N/A;  1:15 PM   CORONARY ARTERY BYPASS GRAFT  1999   x 4   CYSTOSCOPY     ENDARTERECTOMY Left 04/29/2012   Procedure: ENDARTERECTOMY CAROTID;  Surgeon: Krystal JULIANNA Doing, MD;  Location: [facility] OR;  Service: Vascular;  Laterality: Left;  with primary closure of artery    ESOPHAGOGASTRODUODENOSCOPY     FRACTURE SURGERY  1980   screws placed and in 1981 screws removed(this is when was told about potential for [facility]   INGUINAL HERNIA REPAIR Bilateral Aug. 2005   LEFT HEART CATHETERIZATION WITH CORONARY/GRAFT ANGIOGRAM N/A 06/10/2013   Procedure: LEFT HEART CATHETERIZATION WITH EL BILE;  Surgeon: Peter M Jordan, MD;  Location: [facility] CATH LAB;  Service: Cardiovascular;  Laterality: N/A;   Family History  Problem Relation Age of Onset   Kidney failure Mother    Ovarian cancer Mother    Arthritis Mother    Kidney disease Mother    Hypertension Mother    Breast cancer Mother    Hypertension Father    CVA Brother 64   Hypertension Brother    Social History   Occupational History   Occupation: CONSTRUCTION/MAINT    Employer: [employer]    Comment: [employer]  Tobacco Use   Smoking status: Former    Current packs/day: 0.00    Average packs/day: 1 pack/day for 20.0 years (20.0 ttl pk-yrs)    Types: Cigarettes    Start date: 05/14/1976    Quit date: 05/14/1996    Years since quitting: 27.5   Smokeless tobacco: Never   Tobacco comments:    quit 1995  Vaping Use   Vaping status: Never Used  Substance and Sexual Activity   Alcohol use: Yes    Alcohol/week: 6.0 standard drinks of alcohol    Types: 6 Cans of beer per week   Drug use: No   Sexual activity: Not Currently   Tobacco Counseling Counseling given: Yes Tobacco comments: quit 1995  SDOH Screenings   Food Insecurity: No Food Insecurity (11/06/2023)  Housing: Low Risk  (11/06/2023)  Transportation Needs: No Transportation Needs (11/06/2023)  Utilities: Not At Risk (11/09/2023)  Alcohol Screen: Low Risk  (11/06/2023)  Depression (PHQ2-9): Low Risk  (11/09/2023)  Financial Resource Strain: Low Risk  (11/06/2023)  Physical Activity: Sufficiently Active (11/06/2023)  Social Connections: Moderately Isolated (11/06/2023)  Stress: No Stress Concern Present (11/06/2023)  Tobacco  Use: Medium Risk (11/09/2023)  Health Literacy: Adequate Health Literacy (11/09/2023)   Depression Screen    11/09/2023    3:24 PM 03/05/2023   10:27 AM 12/25/2022   10:08 AM 07/27/2022   11:25 AM 06/30/2022   11:14 AM 05/22/2022    3:48 PM 12/09/2021    1:11 PM  PHQ 2/9 Scores  PHQ - 2 Score 0 0 0 0 0 2 2  PHQ- 9 Score 0 0  0  0  0  6  5      Data saved with a previous flowsheet row definition     Goals Addressed               This Visit's Progress     I'd like to get a building or shed built (pt-stated)         Visit info / Clinical Intake: Medicare Wellness Visit Type:: Subsequent Annual Wellness Visit Medicare Wellness Visit Mode:: Telephone If telephone:: video declined If telephone or video:: pt reported vitals Interpreter Needed?: No Pre-visit prep was completed: yes AWV questionnaire completed by patient prior to visit?: yes Date:: 11/06/23 Living arrangements:: (!) lives alone Typical amount of pain: some (LEFT shoulder) Does pain affect daily life?: no Are you currently prescribed opioids?: no  Dietary Habits and Nutritional Risks How many meals a day?: 2 Eats fruit and vegetables daily?: yes Most meals are obtained by: preparing own meals Diabetic:: no  Functional Status Activities of Daily Living (to include ambulation/medication): (Patient-Rptd) Independent Ambulation: (Patient-Rptd) Independent Medication Administration: Independent Home Management: (Patient-Rptd) Independent Manage your own finances?: yes Primary transportation is: driving Concerns about vision?: no *vision screening is required for WTM* Concerns about hearing?: no  Fall Screening Falls in the past year?: (Patient-Rptd) 0 Number of falls in past year: 0 Was there an injury with Fall?: 0 Fall Risk Category Calculator: 0 Patient Fall Risk Level: Low Fall Risk  Fall Risk Patient at Risk for Falls Due to: No Fall Risks Fall risk Follow up: Falls evaluation completed; Education  provided; Falls prevention discussed  Home and Transportation Safety: All rugs have non-skid backing?: N/A, no rugs All stairs or steps have railings?: yes Grab bars in the bathtub or shower?: yes Have non-skid surface in bathtub or shower?: (!) no Good home lighting?: yes Regular seat belt use?: yes Hospital stays in the last year:: no  Cognitive Assessment Difficulty concentrating, remembering, or making decisions? : no Will 6CIT or Mini Cog be Completed: no 6CIT or Mini Cog Declined: patient alert, oriented, able to answer questions appropriately and recall recent events  Advance Directives (For Healthcare) Does Patient Have a Medical Advance Directive?: No Would patient like information on creating a medical advance directive?: No - Patient declined  Reviewed/Updated  Reviewed/Updated: All  Objective:    Today's Vitals   11/09/23 1504  Weight: 175 lb (79.4 kg)  Height: 5' 10 (1.778 m)   Body mass index is 25.11 kg/m.  Current Medications (verified) Outpatient Encounter Medications as of 11/09/2023  Medication Sig   amLODipine  (NORVASC ) 10 MG tablet Take 1 tablet (10 mg total) by mouth daily.   aspirin  EC 81 MG tablet Take 1 tablet (81 mg total) by mouth daily.   carvedilol  (COREG ) 25 MG tablet Take 1 tablet (25 mg total) by mouth 2 (two) times daily with a meal.   cetirizine (ZYRTEC) 10 MG tablet Take 10 mg by mouth daily.   famotidine  (PEPCID ) 10 MG tablet Take 10 mg by mouth 2 (two) times daily.   Multiple Vitamin (MULTIVITAMIN) tablet Take 1 tablet by mouth daily.   predniSONE  (STERAPRED UNI-PAK 21 TAB) 10 MG (21) TBPK tablet 6 tablets on day 1; decrease by 1 tablet daily until gone.   rosuvastatin  (CRESTOR ) 40 MG tablet Take 1 tablet (40 mg total) by mouth daily.   tiZANidine  (ZANAFLEX ) 4 MG tablet Take 1 tablet (4 mg total) by mouth every 8 (eight) hours as needed for muscle spasms.   No facility-administered encounter medications on file as of  11/09/2023.   Hearing/Vision screen Hearing Screening - Comments:: Patient states he has noticed a slight difficulty with his right ear with certain pitches but nothing that he needs testing for Vision Screening - Comments:: Wears rx glasses - up to date with routine eye exams with  Oneil Kawasaki Immunizations and Health Maintenance Health Maintenance  Topic Date Due   Medicare Annual Wellness (AWV)  Never done   Hepatitis C Screening  Never done   DTaP/Tdap/Td (1 - Tdap) Never done   Pneumococcal Vaccine: 50+ Years (1 of 2 - PCV) Never done   Zoster Vaccines- Shingrix (1 of 2) Never done   Influenza Vaccine  Never done   Colonoscopy  11/23/2023   Meningococcal B Vaccine  Aged Out   COVID-19 Vaccine  Discontinued        Assessment/Plan:  This is a routine wellness examination for Michael [name].  Patient Care Team: Cook, Jayce G, DO as PCP - General (Family Medicine) Wonda Sharper, MD as Consulting Physician (Cardiology) Lelon Glendia ONEIDA DEVONNA as Physician Assistant (Cardiology)  I have personally reviewed and noted the following in the patient's chart:   Medical and social history Use of alcohol, tobacco or illicit drugs  Current medications and supplements including opioid prescriptions. Functional ability and status Nutritional status Physical activity Advanced directives List of other physicians Hospitalizations, surgeries, and ER visits in previous 12 months Vitals Screenings to include cognitive, depression, and falls Referrals and appointments  No orders of the defined types were placed in this encounter.  In addition, I have reviewed and discussed with patient certain preventive protocols, quality metrics, and best practice recommendations. A written personalized care plan for preventive services as well as general preventive health recommendations were provided to patient.   Lillian Tigges, CMA   11/09/2023   No follow-ups on file.  After Visit Summary: (MyChart) Due to  this being a telephonic visit, the after visit summary with patients personalized plan was offered to patient via MyChart   Nurse Notes: see note at top of progress note

## 2023-11-09 NOTE — Patient Instructions (Signed)
 Michael [name],  Thank you for taking the time for your Medicare Wellness Visit. I appreciate your continued commitment to your health goals. Please review the care plan we discussed, and feel free to reach out if I can assist you further.  Please note that Annual Wellness Visits do not include a physical exam. Some assessments may be limited, especially if the visit was conducted virtually. If needed, we may recommend an in-person follow-up with your provider.  Ongoing Care Seeing your primary care provider every 3 to 6 months helps us  monitor your health and provide consistent, personalized care.   Referrals If a referral was made during today's visit and you haven't received any updates within two weeks, please contact the referred provider directly to check on the status.  Recommended Screenings:  Health Maintenance  Topic Date Due   Medicare Annual Wellness Visit  Never done   Hepatitis C Screening  Never done   DTaP/Tdap/Td vaccine (1 - Tdap) Never done   Pneumococcal Vaccine for age over 29 (1 of 2 - PCV) Never done   Zoster (Shingles) Vaccine (1 of 2) Never done   Flu Shot  Never done   Colon Cancer Screening  11/23/2023   Meningitis B Vaccine  Aged Out   COVID-19 Vaccine  Discontinued       11/06/2023   11:00 AM  Advanced Directives  Does Patient Have a Medical Advance Directive? No  Would patient like information on creating a medical advance directive? No - Patient declined    Vision: Annual vision screenings are recommended for early detection of glaucoma, cataracts, and diabetic retinopathy. These exams can also reveal signs of chronic conditions such as diabetes and high blood pressure.  Dental: Annual dental screenings help detect early signs of oral cancer, gum disease, and other conditions linked to overall health, including heart disease and diabetes.  Please see the attached documents for additional preventive care recommendations.

## 2023-11-12 ENCOUNTER — Encounter (INDEPENDENT_AMBULATORY_CARE_PROVIDER_SITE_OTHER): Payer: Self-pay | Admitting: *Deleted

## 2023-11-21 ENCOUNTER — Telehealth: Payer: Self-pay

## 2023-11-21 NOTE — Telephone Encounter (Signed)
 Who is your primary care physician: Dr.Jayce Youngman  Reasons for the colonoscopy: screening  Have you had a colonoscopy before?  Yes 2015  Do you have family history of colon cancer? no  Previous colonoscopy with polyps removed? no  Do you have a history colorectal cancer?   no  Are you diabetic? If yes, Type 1 or Type 2?    no  Do you have a prosthetic or mechanical heart valve? no  Do you have a pacemaker/defibrillator?   no  Have you had endocarditis/atrial fibrillation? no  Have you had joint replacement within the last 12 months?  no  Do you tend to be constipated or have to use laxatives? no  Do you have any history of drugs or alchohol?  Yes 2 per day  Do you use supplemental oxygen?  no  Have you had a stroke or heart attack within the last 6 months? no  Do you take weight loss medication?  No  Have you ever had  a heart or other vascular stent placed? Coronary artery bypass graft x 4 1999       Do you take any blood-thinning medications such as: (aspirin , warfarin, Plavix , Aggrenox)  yes   If yes we need the name, milligram, dosage and who is prescribing doctor ASA 81 mg Glendia Ferrier Current Outpatient Medications on File Prior to Visit  Medication Sig Dispense Refill   amLODipine  (NORVASC ) 10 MG tablet Take 1 tablet (10 mg total) by mouth daily. 90 tablet 0   aspirin  EC 81 MG tablet Take 1 tablet (81 mg total) by mouth daily. 90 tablet 3   carvedilol  (COREG ) 25 MG tablet Take 1 tablet (25 mg total) by mouth 2 (two) times daily with a meal. 180 tablet 0   cetirizine (ZYRTEC) 10 MG tablet Take 10 mg by mouth daily.     famotidine  (PEPCID ) 10 MG tablet Take 10 mg by mouth 2 (two) times daily.     Multiple Vitamin (MULTIVITAMIN) tablet Take 1 tablet by mouth daily.     rosuvastatin  (CRESTOR ) 40 MG tablet Take 1 tablet (40 mg total) by mouth daily. 90 tablet 0   No current facility-administered medications on file prior to visit.    Allergies  Allergen  Reactions   Anesthetics, Amide Anaphylaxis   Ramipril Cough   Losartan       Pharmacy: [facility]  Primary Insurance Name: [employer] [id number]  Best number where you can be reached: [phone]

## 2023-12-10 NOTE — Telephone Encounter (Signed)
 ASA 3 - any room.

## 2023-12-11 MED ORDER — PEG 3350-KCL-NA BICARB-NACL 420 G PO SOLR: 4000.0000 mL | Freq: Once | ORAL | 0 refills | Status: AC

## 2023-12-11 NOTE — Addendum Note (Signed)
 Addended by: JEANELL GRAEME RAMAN on: 12/11/2023 11:03 AM   Modules accepted: Orders

## 2023-12-11 NOTE — Telephone Encounter (Signed)
 Spoke with pt. He has been scheduled for 12/18 with Dr. Cindie. He is aware will send prep rx to pharmacy and instructions sent via mychart. He voiced understanding.

## 2023-12-13 ENCOUNTER — Encounter (INDEPENDENT_AMBULATORY_CARE_PROVIDER_SITE_OTHER): Payer: Self-pay | Admitting: *Deleted

## 2023-12-13 NOTE — Telephone Encounter (Signed)
 Referral completed, TCS apt letter sent to PCP

## 2023-12-13 NOTE — Telephone Encounter (Signed)
 Pt called and stated that he read if you are allergic to anesthesia then let provider know and he says he is calling to let us  know. Message sent to provider.

## 2023-12-14 NOTE — Telephone Encounter (Signed)
 Message sent to Delon Shams, CRNA about pt.  Per Charmaine HERO, NP: okay. may be good to give Jenn the CRNA heads up   Message sent to provider: spoke to pt this morning. he said that he had surgery back in the 80's for a broken ankle and a screw was put in. He eventually had to have screw taken out and he said that's when he had issues with anesthesia. he said he believed they said it was malignant hyperthermia. he said he has had several surgeries since then. He just wants to make sure that when he has is procedure that they are aware and know what to do if something happens     Per Kenn HERO, NP: so what does that mean? everyone's definition of allergy to anesthesia is different . if he has every received propofol  and has had malignant hyperthermia then that for sure needs to be noted.  But if he just has like nausea or reports difficulty with waking up with prior anesthesia then this not really a contraindication to doing it.  This is not following anesthesia like with the gas and everything.

## 2023-12-14 NOTE — Telephone Encounter (Signed)
 Per Delon Shams, CRNA:  Thank you Caspian Deleonardis! It is listed in his allergy list we will be sure to avoid any amides.

## 2023-12-14 NOTE — Telephone Encounter (Signed)
Pt has been informed. Verbalized understanding.

## 2023-12-20 ENCOUNTER — Ambulatory Visit (HOSPITAL_COMMUNITY): Admitting: Anesthesiology

## 2023-12-20 ENCOUNTER — Encounter (HOSPITAL_COMMUNITY): Payer: Self-pay | Admitting: Internal Medicine

## 2023-12-20 ENCOUNTER — Other Ambulatory Visit: Payer: Self-pay

## 2023-12-20 ENCOUNTER — Encounter (HOSPITAL_COMMUNITY): Admission: RE | Disposition: A | Payer: Self-pay | Attending: Internal Medicine

## 2023-12-20 ENCOUNTER — Ambulatory Visit (HOSPITAL_COMMUNITY)
Admission: RE | Admit: 2023-12-20 | Discharge: 2023-12-20 | Disposition: A | Discharge status: Home / Self Care | Attending: Internal Medicine | Admitting: Internal Medicine

## 2023-12-20 DIAGNOSIS — I2510 Atherosclerotic heart disease of native coronary artery without angina pectoris: Secondary | ICD-10-CM | POA: Diagnosis not present

## 2023-12-20 DIAGNOSIS — Z87891 Personal history of nicotine dependence: Secondary | ICD-10-CM | POA: Diagnosis not present

## 2023-12-20 DIAGNOSIS — I10 Essential (primary) hypertension: Secondary | ICD-10-CM

## 2023-12-20 DIAGNOSIS — Z1211 Encounter for screening for malignant neoplasm of colon: Secondary | ICD-10-CM | POA: Diagnosis not present

## 2023-12-20 DIAGNOSIS — K5730 Diverticulosis of large intestine without perforation or abscess without bleeding: Secondary | ICD-10-CM | POA: Diagnosis not present

## 2023-12-20 DIAGNOSIS — K219 Gastro-esophageal reflux disease without esophagitis: Secondary | ICD-10-CM | POA: Insufficient documentation

## 2023-12-20 HISTORY — PX: COLONOSCOPY: SHX5424

## 2023-12-20 SURGERY — COLONOSCOPY
Anesthesia: Monitor Anesthesia Care

## 2023-12-20 MED ORDER — LACTATED RINGERS IV SOLN: INTRAVENOUS | Status: DC

## 2023-12-20 MED ORDER — PROPOFOL 500 MG/50ML IV EMUL
INTRAVENOUS | Status: DC | PRN
  Administered 2023-12-20: 13:00:00 100 mg via INTRAVENOUS
  Administered 2023-12-20: 13:00:00 150 ug/kg/min via INTRAVENOUS

## 2023-12-20 MED ORDER — EPHEDRINE SULFATE (PRESSORS) 25 MG/5ML IV SOSY
PREFILLED_SYRINGE | INTRAVENOUS | Status: DC | PRN
  Administered 2023-12-20 (×2): 5 mg via INTRAVENOUS

## 2023-12-20 NOTE — Anesthesia Preprocedure Evaluation (Signed)
 Anesthesia Evaluation  Patient identified by MRN, date of birth, ID band Patient awake    Reviewed: Allergy & Precautions, H&P , NPO status , Patient's Chart, lab work & pertinent test results, reviewed documented beta blocker date and time   History of Anesthesia Complications (+) MALIGNANT HYPERTHERMIA and history of anesthetic complications  Airway Mallampati: II  TM Distance: >3 FB Neck ROM: full    Dental no notable dental hx.    Pulmonary neg pulmonary ROS, former smoker   Pulmonary exam normal breath sounds clear to auscultation       Cardiovascular Exercise Tolerance: Good hypertension, + CAD   Rhythm:regular Rate:Normal     Neuro/Psych  Neuromuscular disease  negative psych ROS   GI/Hepatic Neg liver ROS,GERD  ,,  Endo/Other  negative endocrine ROS    Renal/GU negative Renal ROS  negative genitourinary   Musculoskeletal   Abdominal   Peds  Hematology negative hematology ROS (+)   Anesthesia Other Findings   Reproductive/Obstetrics negative OB ROS                              Anesthesia Physical Anesthesia Plan  ASA: 3  Anesthesia Plan: MAC   Post-op Pain Management:    Induction:   PONV Risk Score and Plan: Propofol  infusion  Airway Management Planned:   Additional Equipment:   Intra-op Plan:   Post-operative Plan:   Informed Consent: I have reviewed the patients History and Physical, chart, labs and discussed the procedure including the risks, benefits and alternatives for the proposed anesthesia with the patient or authorized representative who has indicated his/her understanding and acceptance.     Dental Advisory Given  Plan Discussed with: CRNA  Anesthesia Plan Comments: (Non-triggering anesthetic)        Anesthesia Quick Evaluation

## 2023-12-20 NOTE — Op Note (Signed)
 [facility] Patient Name: Michael [name] Procedure Date: 12/20/2023 1:10 PM MRN: [id number] Date of Birth: 1955-06-30 Attending MD: Carlin POUR. Cindie , [place], [id number] CSN: [id number] Age: 68 Admit Type: Outpatient Procedure:                Colonoscopy Indications:              Screening for colorectal malignant neoplasm Providers:                Carlin POUR. Cindie, DO, Devere Lodge, Dorcas Lenis,                            Technician Referring MD:              Medicines:                See the Anesthesia note for documentation of the                            administered medications Complications:            No immediate complications. Estimated Blood Loss:     Estimated blood loss: none. Procedure:                Pre-Anesthesia Assessment:                           - The anesthesia plan was to use monitored                            anesthesia care (MAC).                           After obtaining informed consent, the colonoscope                            was passed under direct vision. Throughout the                            procedure, the patient's blood pressure, pulse, and                            oxygen saturations were monitored continuously. The                            PCF-HQ190L ([id number]) Peds Colon was introduced                            through the anus and advanced to the the cecum,                            identified by appendiceal orifice and ileocecal                            valve. The colonoscopy was performed without                            difficulty. The patient tolerated the procedure  well. The quality of the bowel preparation was                            evaluated using the BBPS [facility] Bowel Preparation                            Scale) with scores of: Right Colon = 3, Transverse                            Colon = 3 and Left Colon = 3 (entire mucosa seen                            well with no residual staining,  small fragments of                            stool or opaque liquid). The total BBPS score                            equals 9. Scope In: 1:24:00 PM Scope Out: 1:35:54 PM Scope Withdrawal Time: 0 hours 9 minutes 21 seconds  Total Procedure Duration: 0 hours 11 minutes 54 seconds  Findings:      Multiple medium-mouthed and small-mouthed diverticula were found in the       sigmoid colon and descending colon.      The entire examined colon appeared normal. Impression:               - Diverticulosis in the sigmoid colon and in the                            descending colon.                           - The entire examined colon is normal.                           - No specimens collected. Moderate Sedation:      Per Anesthesia Care Recommendation:           - Patient has a contact number available for                            emergencies. The signs and symptoms of potential                            delayed complications were discussed with the                            patient. Return to normal activities tomorrow.                            Written discharge instructions were provided to the                            patient.                           -  Resume previous diet.                           - Continue present medications.                           - Repeat colonoscopy in 10 years for screening                            purposes.                           - Return to GI clinic PRN. Procedure Code(s):        --- Professional ---                           H9878, Colorectal cancer screening; colonoscopy on                            individual not meeting criteria for high risk Diagnosis Code(s):        --- Professional ---                           Z12.11, Encounter for screening for malignant                            neoplasm of colon                           K57.30, Diverticulosis of large intestine without                            perforation or abscess without  bleeding CPT copyright 2022 American Medical Association. All rights reserved. The codes documented in this report are preliminary and upon coder review may  be revised to meet current compliance requirements. Carlin POUR. Cindie, DO Carlin POUR. Cindie, DO 12/20/2023 1:37:52 PM This report has been signed electronically. Number of Addenda: 0

## 2023-12-20 NOTE — Transfer of Care (Signed)
 Immediate Anesthesia Transfer of Care Note  Patient: Michael [name]  Procedure(s) Performed: COLONOSCOPY  Patient Location: Short Stay  Anesthesia Type:MAC  Level of Consciousness: drowsy and patient cooperative  Airway & Oxygen Therapy: Patient Spontanous Breathing  Post-op Assessment: Report given to RN and Post -op Vital signs reviewed and stable  Post vital signs: Reviewed and stable  Last Vitals:  Vitals Value Taken Time  BP 102/54 12/20/23 13:42  Temp      Pulse 74 12/20/23 13:42  Resp 12 12/20/23 13:42  SpO2 98% on RA  12/20/23 13:42    Last Pain:  Vitals:   12/20/23 1317  TempSrc:   PainSc: 0-No pain      Patients Stated Pain Goal: 10 (12/20/23 1218)  Complications: No notable events documented.:

## 2023-12-20 NOTE — H&P (Signed)
 Primary Care Physician:  Cook, Jayce G, DO Primary Gastroenterologist:  Dr. Cindie  Pre-Procedure History & Physical: HPI:  Michael [name] is a 68 y.o. male is here for a colonoscopy for colon cancer screening purposes.  Patient denies any family history of colorectal cancer.   Past Medical History:  Diagnosis Date   Allergy    Back pain    Bruises easily    CAD (coronary artery disease)    a. s/p CABG 1999. b. Cath 2015 patent grafts.   Carotid artery disease    a. s/p L CEA 2014, followed by [facility].   Essential hypertension    GERD (gastroesophageal reflux disease)    was on Prilosec;is not currently on any meds   Gout    takes Allopurinol  daily   Grade II diastolic dysfunction 08/14/2022   TTE 08/14/2022: EF 60-65, no RWMA, GR 2 DD, normal RVSF, normal PASP, trivial MR, trivial AI, AV sclerosis, RAP 3     Hyperglycemia    Hyperlipidemia    Malignant hyperthermia    Potential but not Confirmed   Neck pain    yrs ago and saw a chiropractor occasionally   Urinary urgency     Past Surgical History:  Procedure Laterality Date   ANKLE FRACTURE SURGERY Left 1981   CARDIAC CATHETERIZATION  2015   CAROTID ENDARTERECTOMY Left 04-29-12   cea   COLONOSCOPY     COLONOSCOPY N/A 11/21/2013   Procedure: COLONOSCOPY;  Surgeon: Margo LITTIE Haddock, MD;  Location: [facility];  Service: Endoscopy;  Laterality: N/A;  1:15 PM   CORONARY ARTERY BYPASS GRAFT  1999   x 4   CYSTOSCOPY     ENDARTERECTOMY Left 04/29/2012   Procedure: ENDARTERECTOMY CAROTID;  Surgeon: Krystal JULIANNA Doing, MD;  Location: [facility] OR;  Service: Vascular;  Laterality: Left;  with primary closure of artery   ESOPHAGOGASTRODUODENOSCOPY     FRACTURE SURGERY  1980   screws placed and in 1981 screws removed(this is when was told about potential for [facility]   INGUINAL HERNIA REPAIR Bilateral Aug. 2005   LEFT HEART CATHETERIZATION WITH CORONARY/GRAFT ANGIOGRAM N/A 06/10/2013   Procedure: LEFT HEART CATHETERIZATION WITH EL BILE;   Surgeon: Peter M Jordan, MD;  Location: [facility] CATH LAB;  Service: Cardiovascular;  Laterality: N/A;    Prior to Admission medications  Medication Sig Start Date End Date Taking? Authorizing Provider  amLODipine  (NORVASC ) 10 MG tablet Take 1 tablet (10 mg total) by mouth daily. 11/07/23  Yes Lelon Hamilton T, PA-C  aspirin  EC 81 MG tablet Take 1 tablet (81 mg total) by mouth daily. 07/21/16  Yes Maranda Leim DEL, MD  carvedilol  (COREG ) 25 MG tablet Take 1 tablet (25 mg total) by mouth 2 (two) times daily with a meal. 11/07/23  Yes Weaver, Scott T, PA-C  cetirizine (ZYRTEC) 10 MG tablet Take 10 mg by mouth daily.   Yes [provider]  famotidine  (PEPCID ) 10 MG tablet Take 10 mg by mouth 2 (two) times daily.   Yes [provider]  Multiple Vitamin (MULTIVITAMIN) tablet Take 1 tablet by mouth daily.   Yes [provider]  rosuvastatin  (CRESTOR ) 40 MG tablet Take 1 tablet (40 mg total) by mouth daily. 11/07/23  Yes Weaver, Scott T, PA-C    Allergies as of 12/11/2023 - Review Complete 11/21/2023  Allergen Reaction Noted   Anesthetics, amide Anaphylaxis 03/22/2012   Ramipril Cough    Losartan   11/14/2013    Family History  Problem Relation Age of  Onset   Kidney failure Mother    Ovarian cancer Mother    Arthritis Mother    Kidney disease Mother    Hypertension Mother    Breast cancer Mother    Hypertension Father    CVA Brother 86   Hypertension Brother     Social History   Socioeconomic History   Marital status: Divorced    Spouse name: Not on file   Number of children: Not on file   Years of education: Not on file   Highest education level: GED or equivalent  Occupational History   Occupation: CONSTRUCTION/MAINT    Employer: [employer]    Comment: [employer]  Tobacco Use   Smoking status: Former    Current packs/day: 0.00    Average packs/day: 1 pack/day for 20.0 years (20.0 ttl pk-yrs)    Types: Cigarettes    Start date: 05/14/1976     Quit date: 05/14/1996    Years since quitting: 27.6   Smokeless tobacco: Never   Tobacco comments:    quit 1995  Vaping Use   Vaping status: Never Used  Substance and Sexual Activity   Alcohol use: Yes    Alcohol/week: 6.0 standard drinks of alcohol    Types: 6 Cans of beer per week    Comment: DAILY   Drug use: No   Sexual activity: Not Currently  Other Topics Concern   Not on file  Social History Narrative   Not on file   Social Drivers of Health   Tobacco Use: Medium Risk (12/20/2023)   Patient History    Smoking Tobacco Use: Former    Smokeless Tobacco Use: Never    Passive Exposure: Not on file  Financial Resource Strain: Low Risk (11/06/2023)   Overall Financial Resource Strain (CARDIA)    Difficulty of Paying Living Expenses: Not hard at all  Food Insecurity: No Food Insecurity (11/06/2023)   Epic    Worried About Programme Researcher, Broadcasting/film/video in the Last Year: Never true    Ran Out of Food in the Last Year: Never true  Transportation Needs: No Transportation Needs (11/06/2023)   Epic    Lack of Transportation (Medical): No    Lack of Transportation (Non-Medical): No  Physical Activity: Sufficiently Active (11/06/2023)   Exercise Vital Sign    Days of Exercise per Week: 3 days    Minutes of Exercise per Session: 60 min  Stress: No Stress Concern Present (11/06/2023)   [employer] of Occupational Health - Occupational Stress Questionnaire    Feeling of Stress: Not at all  Social Connections: Moderately Isolated (11/06/2023)   Social Connection and Isolation Panel    Frequency of Communication with Friends and Family: Once a week    Frequency of Social Gatherings with Friends and Family: Once a week    Attends Religious Services: More than 4 times per year    Active Member of Clubs or Organizations: Yes    Attends Banker Meetings: More than 4 times per year    Marital Status: Divorced  Intimate Partner Violence: Not At Risk (11/09/2023)   Epic    Fear of  Current or Ex-Partner: No    Emotionally Abused: No    Physically Abused: No    Sexually Abused: No  Depression (PHQ2-9): Low Risk (11/09/2023)   Depression (PHQ2-9)    PHQ-2 Score: 0  Alcohol Screen: Low Risk (11/06/2023)   Alcohol Screen    Last Alcohol Screening Score (AUDIT): 5  Housing:  Low Risk (11/06/2023)   Epic    Unable to Pay for Housing in the Last Year: No    Number of Times Moved in the Last Year: 0    Homeless in the Last Year: No  Utilities: Not At Risk (11/09/2023)   Epic    Threatened with loss of utilities: No  Health Literacy: Adequate Health Literacy (11/09/2023)   B1300 Health Literacy    Frequency of need for help with medical instructions: Never    Review of Systems: See HPI, otherwise negative ROS  Physical Exam: Vital signs in last 24 hours:     General:   Alert,  Well-developed, well-nourished, pleasant and cooperative in NAD Head:  Normocephalic and atraumatic. Eyes:  Sclera clear, no icterus.   Conjunctiva pink. Ears:  Normal auditory acuity. Nose:  No deformity, discharge,  or lesions. Msk:  Symmetrical without gross deformities. Normal posture. Extremities:  Without clubbing or edema. Neurologic:  Alert and  oriented x4;  grossly normal neurologically. Skin:  Intact without significant lesions or rashes. Psych:  Alert and cooperative. Normal mood and affect.  Impression/Plan: Michael [name] is here for a colonoscopy to be performed for colon cancer screening purposes.  The risks of the procedure including infection, bleed, or perforation as well as benefits, limitations, alternatives and imponderables have been reviewed with the patient. Questions have been answered. All parties agreeable.

## 2023-12-20 NOTE — Discharge Instructions (Signed)
?  Colonoscopy Discharge Instructions  Read the instructions outlined below and refer to this sheet in the next few weeks. These discharge instructions provide you with general information on caring for yourself after you leave the hospital. Your doctor may also give you specific instructions. While your treatment has been planned according to the most current medical practices available, unavoidable complications occasionally occur.   ACTIVITY You may resume your regular activity, but move at a slower pace for the next 24 hours.  Take frequent rest periods for the next 24 hours.  Walking will help get rid of the air and reduce the bloated feeling in your belly (abdomen).  No driving for 24 hours (because of the medicine (anesthesia) used during the test).   Do not sign any important legal documents or operate any machinery for 24 hours (because of the anesthesia used during the test).  NUTRITION Drink plenty of fluids.  You may resume your normal diet as instructed by your doctor.  Begin with a light meal and progress to your normal diet. Heavy or fried foods are harder to digest and may make you feel sick to your stomach (nauseated).  Avoid alcoholic beverages for 24 hours or as instructed.  MEDICATIONS You may resume your normal medications unless your doctor tells you otherwise.  WHAT YOU CAN EXPECT TODAY Some feelings of bloating in the abdomen.  Passage of more gas than usual.  Spotting of blood in your stool or on the toilet paper.  IF YOU HAD POLYPS REMOVED DURING THE COLONOSCOPY: No aspirin products for 7 days or as instructed.  No alcohol for 7 days or as instructed.  Eat a soft diet for the next 24 hours.  FINDING OUT THE RESULTS OF YOUR TEST Not all test results are available during your visit. If your test results are not back during the visit, make an appointment with your caregiver to find out the results. Do not assume everything is normal if you have not heard from your  caregiver or the medical facility. It is important for you to follow up on all of your test results.  SEEK IMMEDIATE MEDICAL ATTENTION IF: You have more than a spotting of blood in your stool.  Your belly is swollen (abdominal distention).  You are nauseated or vomiting.  You have a temperature over 101.  You have abdominal pain or discomfort that is severe or gets worse throughout the day.   Your colonoscopy was relatively unremarkable.  I did not find any polyps or evidence of colon cancer.  I recommend repeating colonoscopy in 10 years for colon cancer screening purposes.  You do have diverticulosis. I would recommend increasing fiber in your diet or adding OTC Benefiber/Metamucil. Be sure to drink at least 4 to 6 glasses of water daily. Follow-up with GI as needed.   I hope you have a great rest of your week!  Charles K. Carver, D.O. Gastroenterology and Hepatology [facility]  

## 2023-12-21 ENCOUNTER — Encounter (HOSPITAL_COMMUNITY): Payer: Self-pay | Admitting: Internal Medicine

## 2023-12-21 NOTE — Anesthesia Postprocedure Evaluation (Signed)
"   Anesthesia Post Note  Patient: Michael [name]  Procedure(s) Performed: COLONOSCOPY  Patient location during evaluation: Phase II Anesthesia Type: MAC Level of consciousness: awake Pain management: pain level controlled Vital Signs Assessment: post-procedure vital signs reviewed and stable Respiratory status: spontaneous breathing and respiratory function stable Cardiovascular status: blood pressure returned to baseline and stable Postop Assessment: no headache and no apparent nausea or vomiting Anesthetic complications: no Comments: Late entry   No notable events documented.   Last Vitals:  Vitals:   12/20/23 1341 12/20/23 1344  BP: (!) 95/39 (!) 109/58  Pulse: 71   Resp: 20   Temp: 36.7 C   SpO2: 98%     Last Pain:  Vitals:   12/20/23 1344  TempSrc:   PainSc: 0-No pain                 Michael [name] [name]      "

## 2023-12-25 ENCOUNTER — Other Ambulatory Visit: Payer: Self-pay

## 2023-12-25 DIAGNOSIS — Z79899 Other long term (current) drug therapy: Secondary | ICD-10-CM | POA: Diagnosis not present

## 2023-12-25 DIAGNOSIS — E782 Mixed hyperlipidemia: Secondary | ICD-10-CM | POA: Diagnosis not present

## 2023-12-25 LAB — COMPREHENSIVE METABOLIC PANEL WITH GFR
ALT: 32 IU/L (ref 0–44)
AST: 33 IU/L (ref 0–40)
Albumin: 4.7 g/dL (ref 3.9–4.9)
Alkaline Phosphatase: 64 IU/L (ref 47–123)
BUN/Creatinine Ratio: 11 (ref 10–24)
BUN: 12 mg/dL (ref 8–27)
Bilirubin Total: 0.3 mg/dL (ref 0.0–1.2)
CO2: 26 mmol/L (ref 20–29)
Calcium: 9.8 mg/dL (ref 8.6–10.2)
Chloride: 100 mmol/L (ref 96–106)
Creatinine, Ser: 1.06 mg/dL (ref 0.76–1.27)
Globulin, Total: 2.4 g/dL (ref 1.5–4.5)
Glucose: 98 mg/dL (ref 70–99)
Potassium: 4.5 mmol/L (ref 3.5–5.2)
Sodium: 143 mmol/L (ref 134–144)
Total Protein: 7.1 g/dL (ref 6.0–8.5)
eGFR: 76 mL/min/1.73

## 2023-12-25 LAB — LIPID PANEL
Chol/HDL Ratio: 2 ratio (ref 0.0–5.0)
Cholesterol, Total: 140 mg/dL (ref 100–199)
HDL: 69 mg/dL
LDL Chol Calc (NIH): 57 mg/dL (ref 0–99)
Triglycerides: 71 mg/dL (ref 0–149)
VLDL Cholesterol Cal: 14 mg/dL (ref 5–40)

## 2023-12-26 ENCOUNTER — Ambulatory Visit: Payer: Self-pay | Admitting: Physician Assistant

## 2024-01-15 NOTE — Progress Notes (Unsigned)
 "      OFFICE NOTE:    Date:  01/16/2024  ID:  Michael [name], DOB 1955-11-27, MRN [id number] PCP: Michael Jacqulyn MATSU, DO  [facility] Providers Cardiologist:  Michael Fell, MD Cardiology APP:  Michael Michael DASEN, PA-C        Coronary artery disease  S/p CABG in 1999 TTE 01/04/2012: Mild LVH, EF 60-65, no RWMA  LHC 06/10/2013: all grafts patent (SVG-D2, SVG-OM 2, RIMA-RCA, LIMA-LAD) Myoview  03/2018: low risk  Diastolic dysfunction TTE 08/14/2022: EF 60-65, no RWMA, GR 2 DD, normal RVSF, normal PASP, trivial MR, trivial AI, AV sclerosis, RAP 3 Carotid artery disease  S/p L CEA in 2014 Carotid US  01/21/2018: R ICA 1-39, L CEA patent US  11/02/2022: L CEA patent, R ICA no significant stenosis  Hypertension Hyperlipidemia GERD Alpha gal allergy Gout        Discussed the use of AI scribe software for clinical note transcription with the patient, who gave verbal consent to proceed. History of Present Illness Michael [name] is a 69 y.o. male for follow-up of CAD, carotid artery disease.  He was last seen October 2024.  He is a prior patient of Dr. Maranda.  He was to follow-up with Dr. Hobart.  He has continued to follow with me since Dr. Hobart left.  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 He exercises regularly, walking and attending the [employer] through [employer], which has helped him lose approximately 30 pounds. He experiences muscle pain in his shoulders, which he attributes to arthritis, and has not exercised in the past week due to this pain.  He has not had chest discomfort or significant shortness of breath.    Review of Systems  Cardiovascular:  Negative for claudication.  -See HPI     Studies Reviewed:  EKG Interpretation Date/Time:  Wednesday January 16 2024 07:57:45 EST Ventricular Rate:  65 PR Interval:  204 QRS Duration:  84 QT Interval:  372 QTC Calculation: 386 R Axis:   24  Text Interpretation: Normal  sinus rhythm Nonspecific T wave abnormality When compared with ECG of 24-Oct-2022 07:56, No significant change was found Confirmed by Michael Michael [phone]) on 01/16/2024 8:03:23 AM    LABS 12/25/2023: K 4.5, creatinine 1.06, ALT 32, eGFR 76, total cholesterol 140, HDL 69, triglycerides 71, LDL 50          Physical Exam:  VS:  BP 110/72   Pulse 64   Wt 185 lb (83.9 kg)   SpO2 97%   BMI 26.54 kg/m        Wt Readings from Last 3 Encounters:  01/16/24 185 lb (83.9 kg)  12/20/23 180 lb (81.6 kg)  11/09/23 175 lb (79.4 kg)    Constitutional:      Appearance: Healthy appearance. Not in distress.  Neck:     Vascular: JVD normal.  Pulmonary:     Breath sounds: Normal breath sounds. No wheezing. No rales.  Cardiovascular:     Normal rate. Regular rhythm.     Murmurs: There is no murmur.  Edema:    Peripheral edema absent.  Abdominal:     Palpations: Abdomen is soft.       Assessment and Plan:    Assessment & Plan Coronary artery disease involving native coronary artery of native heart without angina pectoris Status post CABG in 1999 with all grafts patent by cardiac catheterization in 2015. Myoview  in 2020 was low risk. No  current chest symptoms. Blood pressure is well-controlled.  LDL cholesterol is at goal with current treatment. - Continue current medications: aspirin  81 mg daily, carvedilol  25 mg twice daily, Crestor  40 mg daily. - Follow-up 1 year Grade II diastolic dysfunction Grade 2 diastolic dysfunction on echocardiogram in 08/2022.  No signs or symptoms of volume excess.  AHA Stage B.  Essential hypertension Blood pressure controlled. -Continue carvedilol  25 mg twice daily, amlodipine  10 mg daily Mixed hyperlipidemia LDL optimal.  He does note muscle pain and joint pain.  This may all be related to arthritis.  However, I have suggested that hold rosuvastatin  for 2 weeks to see if this helps his symptoms.  If he feels much better, we can consider PCSK9 inhibitor. - Hold  rosuvastatin  for 2 weeks - If symptoms resolve, he should contact me - If symptoms do not resolve, resume rosuvastatin  40 mg daily Bilateral carotid artery stenosis Left CEA patent by ultrasound in October 2024.  There was no significant right ICA stenosis. - Arrange follow-up carotid US  in 1 year        Dispo:  Return in about 1 year (around 01/15/2025) for Routine Follow Up, w/ Dr. Wonda, or Michael Ferrier, PA-C.  Signed, Michael Ferrier, PA-C   "

## 2024-01-15 NOTE — Assessment & Plan Note (Signed)
 Left CEA patent by ultrasound in October 2024.  There was no significant right ICA stenosis.***

## 2024-01-15 NOTE — Assessment & Plan Note (Signed)
 Status post CABG in 1999 with all grafts patent by cardiac catheterization in 2015. Myoview  in 2020 was low risk. No current chest symptoms. Blood pressure is well-controlled.  LDL cholesterol is at goal with current treatment. - Continue current medications: aspirin  81 mg daily, carvedilol  25 mg twice daily, Crestor  40 mg daily. - Follow-up 1 year

## 2024-01-15 NOTE — Assessment & Plan Note (Signed)
 Grade 2 diastolic dysfunction on echocardiogram in 08/2022. ***AHA Stage B.***

## 2024-01-16 ENCOUNTER — Ambulatory Visit: Attending: Cardiology | Admitting: Physician Assistant

## 2024-01-16 ENCOUNTER — Encounter: Payer: Self-pay | Admitting: Physician Assistant

## 2024-01-16 VITALS — BP 110/72 | HR 64 | Ht 70.0 in | Wt 185.0 lb

## 2024-01-16 DIAGNOSIS — I5189 Other ill-defined heart diseases: Secondary | ICD-10-CM

## 2024-01-16 DIAGNOSIS — I6523 Occlusion and stenosis of bilateral carotid arteries: Secondary | ICD-10-CM

## 2024-01-16 DIAGNOSIS — I2510 Atherosclerotic heart disease of native coronary artery without angina pectoris: Secondary | ICD-10-CM | POA: Diagnosis not present

## 2024-01-16 DIAGNOSIS — I10 Essential (primary) hypertension: Secondary | ICD-10-CM

## 2024-01-16 DIAGNOSIS — E782 Mixed hyperlipidemia: Secondary | ICD-10-CM | POA: Diagnosis not present

## 2024-01-16 MED ORDER — ROSUVASTATIN CALCIUM 40 MG PO TABS: 40.0000 mg | ORAL_TABLET | Freq: Every day | ORAL | 3 refills | Status: AC

## 2024-01-16 MED ORDER — CARVEDILOL 25 MG PO TABS: 25.0000 mg | ORAL_TABLET | Freq: Two times a day (BID) | ORAL | 3 refills | Status: AC

## 2024-01-16 MED ORDER — AMLODIPINE BESYLATE 10 MG PO TABS: 10.0000 mg | ORAL_TABLET | Freq: Every day | ORAL | 3 refills | Status: AC

## 2024-01-16 NOTE — Assessment & Plan Note (Signed)
 LDL optimal.  He does note muscle pain and joint pain.  This may all be related to arthritis.  However, I have suggested that hold rosuvastatin  for 2 weeks to see if this helps his symptoms.  If he feels much better, we can consider PCSK9 inhibitor. - Hold rosuvastatin  for 2 weeks - If symptoms resolve, he should contact me - If symptoms do not resolve, resume rosuvastatin  40 mg daily

## 2024-01-16 NOTE — Patient Instructions (Addendum)
 Medication Instructions:  YOUR REFILLS HAVE BEEN SENT TO YOUR PHARMACY    You can hold (not take) Rosuvastatin  for 2 weeks. If you feel better, let me know. If it does not make a difference, go ahead and resume it.   *If you need a refill on your cardiac medications before your next appointment, please call your pharmacy*   Lab Work: No labs were ordered during today's visit.  If you have labs (blood work) drawn today and your tests are completely normal, you will receive your results only by: MyChart Message (if you have MyChart) OR A paper copy in the mail If you have any lab test that is abnormal or we need to change your treatment, we will call you to review the results.   Testing/Procedures: Your physician has requested that you have a carotid duplex IN ONE YEAR. This test is an ultrasound of the carotid arteries in your neck. It looks at blood flow through these arteries that supply the brain with blood. Allow one hour for this exam. There are no restrictions or special instructions.   Your next appointment:   12 month(s)   Provider:   Ozell Fell, MD or Glendia Ferrier, PA-C           Follow-Up: At [facility], you and your health needs are our priority.  As part of our continuing mission to provide you with exceptional heart care, we have created designated Provider Care Teams.  These Care Teams include your primary Cardiologist (physician) and Advanced Practice Providers (APPs -  Physician Assistants and Nurse Practitioners) who all work together to provide you with the care you need, when you need it. We recommend signing up for the patient portal called MyChart.  Sign up information is provided on this After Visit Summary.  MyChart is used to connect with patients for Virtual Visits (Telemedicine).  Patients are able to view lab/test results, encounter notes, upcoming appointments, etc.  Non-urgent messages can be sent to your provider as well.   To learn more  about what you can do with MyChart, go to [email or web address].

## 2024-01-16 NOTE — Assessment & Plan Note (Signed)
 Blood pressure controlled. -Continue carvedilol  25 mg twice daily, amlodipine  10 mg daily

## 2024-11-13 ENCOUNTER — Ambulatory Visit

## 2025-01-15 ENCOUNTER — Ambulatory Visit (HOSPITAL_COMMUNITY)
